# Patient Record
Sex: Male | Born: 1940 | Race: White | Hispanic: No | Marital: Married | Smoking: Current some day smoker
Health system: Southern US, Community
[De-identification: ages and names within clinical notes are randomized; demographics above are authoritative.]

## PROBLEM LIST (undated history)

## (undated) DIAGNOSIS — F329 Major depressive disorder, single episode, unspecified: Secondary | ICD-10-CM

## (undated) DIAGNOSIS — F419 Anxiety disorder, unspecified: Principal | ICD-10-CM

## (undated) DIAGNOSIS — K602 Anal fissure, unspecified: Secondary | ICD-10-CM

## (undated) DIAGNOSIS — F1011 Alcohol abuse, in remission: Secondary | ICD-10-CM

## (undated) DIAGNOSIS — K219 Gastro-esophageal reflux disease without esophagitis: Secondary | ICD-10-CM

## (undated) DIAGNOSIS — K635 Polyp of colon: Secondary | ICD-10-CM

## (undated) DIAGNOSIS — C4490 Unspecified malignant neoplasm of skin, unspecified: Secondary | ICD-10-CM

## (undated) DIAGNOSIS — F410 Panic disorder [episodic paroxysmal anxiety] without agoraphobia: Secondary | ICD-10-CM

## (undated) DIAGNOSIS — I739 Peripheral vascular disease, unspecified: Secondary | ICD-10-CM

## (undated) DIAGNOSIS — N400 Enlarged prostate without lower urinary tract symptoms: Secondary | ICD-10-CM

## (undated) DIAGNOSIS — A630 Anogenital (venereal) warts: Secondary | ICD-10-CM

## (undated) DIAGNOSIS — M1990 Unspecified osteoarthritis, unspecified site: Secondary | ICD-10-CM

## (undated) DIAGNOSIS — I779 Disorder of arteries and arterioles, unspecified: Secondary | ICD-10-CM

## (undated) DIAGNOSIS — F32A Depression, unspecified: Secondary | ICD-10-CM

## (undated) DIAGNOSIS — I10 Essential (primary) hypertension: Secondary | ICD-10-CM

## (undated) HISTORY — DX: Polyp of colon: K63.5

## (undated) HISTORY — PX: KNEE SURGERY: SHX244

## (undated) HISTORY — DX: Depression, unspecified: F32.A

## (undated) HISTORY — DX: Peripheral vascular disease, unspecified: I73.9

## (undated) HISTORY — PX: OTHER SURGICAL HISTORY: SHX169

## (undated) HISTORY — DX: Benign prostatic hyperplasia without lower urinary tract symptoms: N40.0

## (undated) HISTORY — DX: Anogenital (venereal) warts: A63.0

## (undated) HISTORY — PX: APPENDECTOMY: SHX54

## (undated) HISTORY — DX: Major depressive disorder, single episode, unspecified: F32.9

## (undated) HISTORY — PX: TRANSURETHRAL RESECTION OF PROSTATE: SHX73

## (undated) HISTORY — DX: Anal fissure, unspecified: K60.2

## (undated) HISTORY — DX: Essential (primary) hypertension: I10

## (undated) HISTORY — DX: Panic disorder (episodic paroxysmal anxiety): F41.0

## (undated) HISTORY — DX: Gastro-esophageal reflux disease without esophagitis: K21.9

## (undated) HISTORY — DX: Unspecified osteoarthritis, unspecified site: M19.90

## (undated) HISTORY — PX: CATARACT EXTRACTION: SUR2

## (undated) HISTORY — DX: Unspecified malignant neoplasm of skin, unspecified: C44.90

## (undated) HISTORY — DX: Anxiety disorder, unspecified: F41.9

## (undated) HISTORY — DX: Disorder of arteries and arterioles, unspecified: I77.9

## (undated) HISTORY — PX: FOOT SURGERY: SHX648

---

## 1998-01-23 ENCOUNTER — Other Ambulatory Visit: Admission: RE | Admit: 1998-01-23 | Discharge: 1998-01-23 | Payer: Self-pay | Admitting: Urology

## 1998-11-14 ENCOUNTER — Ambulatory Visit (HOSPITAL_COMMUNITY): Admission: RE | Admit: 1998-11-14 | Discharge: 1998-11-14 | Payer: Self-pay | Admitting: *Deleted

## 1998-11-14 ENCOUNTER — Encounter (INDEPENDENT_AMBULATORY_CARE_PROVIDER_SITE_OTHER): Payer: Self-pay | Admitting: *Deleted

## 2000-03-19 ENCOUNTER — Other Ambulatory Visit: Admission: RE | Admit: 2000-03-19 | Discharge: 2000-03-19 | Payer: Self-pay | Admitting: Surgery

## 2000-04-30 ENCOUNTER — Ambulatory Visit (HOSPITAL_BASED_OUTPATIENT_CLINIC_OR_DEPARTMENT_OTHER): Admission: RE | Admit: 2000-04-30 | Discharge: 2000-04-30 | Payer: Self-pay | Admitting: Surgery

## 2001-04-18 ENCOUNTER — Encounter: Admission: RE | Admit: 2001-04-18 | Discharge: 2001-04-18 | Payer: Self-pay | Admitting: Family Medicine

## 2001-04-18 ENCOUNTER — Encounter: Payer: Self-pay | Admitting: Family Medicine

## 2002-09-22 ENCOUNTER — Encounter (INDEPENDENT_AMBULATORY_CARE_PROVIDER_SITE_OTHER): Payer: Self-pay | Admitting: Specialist

## 2002-09-22 ENCOUNTER — Ambulatory Visit (HOSPITAL_COMMUNITY): Admission: RE | Admit: 2002-09-22 | Discharge: 2002-09-22 | Payer: Self-pay | Admitting: *Deleted

## 2003-01-13 HISTORY — PX: TOTAL HIP ARTHROPLASTY: SHX124

## 2003-05-28 ENCOUNTER — Emergency Department (HOSPITAL_COMMUNITY): Admission: EM | Admit: 2003-05-28 | Discharge: 2003-05-28 | Payer: Self-pay | Admitting: Emergency Medicine

## 2003-10-12 ENCOUNTER — Encounter: Admission: RE | Admit: 2003-10-12 | Discharge: 2003-10-12 | Payer: Self-pay | Admitting: Orthopedic Surgery

## 2003-10-15 ENCOUNTER — Encounter: Admission: RE | Admit: 2003-10-15 | Discharge: 2003-10-15 | Payer: Self-pay | Admitting: Orthopedic Surgery

## 2003-10-30 ENCOUNTER — Inpatient Hospital Stay (HOSPITAL_COMMUNITY): Admission: RE | Admit: 2003-10-30 | Discharge: 2003-11-04 | Payer: Self-pay | Admitting: Orthopedic Surgery

## 2005-01-12 LAB — HM COLONOSCOPY

## 2005-08-18 ENCOUNTER — Encounter (INDEPENDENT_AMBULATORY_CARE_PROVIDER_SITE_OTHER): Payer: Self-pay | Admitting: *Deleted

## 2005-08-18 ENCOUNTER — Ambulatory Visit (HOSPITAL_COMMUNITY): Admission: RE | Admit: 2005-08-18 | Discharge: 2005-08-18 | Payer: Self-pay | Admitting: Gastroenterology

## 2005-10-13 ENCOUNTER — Ambulatory Visit: Payer: Self-pay | Admitting: Family Medicine

## 2005-10-30 ENCOUNTER — Ambulatory Visit: Payer: Self-pay | Admitting: *Deleted

## 2005-11-06 ENCOUNTER — Ambulatory Visit: Payer: Self-pay | Admitting: *Deleted

## 2005-11-13 ENCOUNTER — Ambulatory Visit: Payer: Self-pay | Admitting: *Deleted

## 2005-11-17 ENCOUNTER — Ambulatory Visit (HOSPITAL_COMMUNITY): Admission: RE | Admit: 2005-11-17 | Discharge: 2005-11-17 | Payer: Self-pay | Admitting: Gastroenterology

## 2005-11-17 ENCOUNTER — Encounter (INDEPENDENT_AMBULATORY_CARE_PROVIDER_SITE_OTHER): Payer: Self-pay | Admitting: Specialist

## 2005-12-09 ENCOUNTER — Ambulatory Visit: Payer: Self-pay | Admitting: Family Medicine

## 2006-01-08 ENCOUNTER — Ambulatory Visit: Payer: Self-pay | Admitting: Family Medicine

## 2006-01-08 LAB — CONVERTED CEMR LAB
BUN: 11 mg/dL (ref 6–23)
Creatinine, Ser: 1.4 mg/dL (ref 0.4–1.5)
GFR calc non Af Amer: 54 mL/min
Glomerular Filtration Rate, Af Am: 66 mL/min/{1.73_m2}
Potassium: 3.8 meq/L (ref 3.5–5.1)

## 2006-02-15 ENCOUNTER — Ambulatory Visit: Payer: Self-pay | Admitting: Family Medicine

## 2006-03-16 ENCOUNTER — Ambulatory Visit: Payer: Self-pay | Admitting: Family Medicine

## 2006-03-16 LAB — CONVERTED CEMR LAB
Creatinine, Ser: 1 mg/dL (ref 0.4–1.5)
Potassium: 3.9 meq/L (ref 3.5–5.1)

## 2006-04-19 DIAGNOSIS — I10 Essential (primary) hypertension: Secondary | ICD-10-CM | POA: Insufficient documentation

## 2006-04-19 DIAGNOSIS — Z860100 Personal history of colon polyps, unspecified: Secondary | ICD-10-CM | POA: Insufficient documentation

## 2006-04-19 DIAGNOSIS — K219 Gastro-esophageal reflux disease without esophagitis: Secondary | ICD-10-CM

## 2006-07-19 ENCOUNTER — Ambulatory Visit: Payer: Self-pay | Admitting: Family Medicine

## 2006-07-25 LAB — CONVERTED CEMR LAB
BUN: 11 mg/dL (ref 6–23)
Basophils Absolute: 0 10*3/uL (ref 0.0–0.1)
Basophils Relative: 0.5 % (ref 0.0–1.0)
CO2: 29 meq/L (ref 19–32)
Calcium: 9 mg/dL (ref 8.4–10.5)
Creatinine, Ser: 1.2 mg/dL (ref 0.4–1.5)
Monocytes Relative: 9.4 % (ref 3.0–11.0)
Platelets: 178 10*3/uL (ref 150–400)
RBC: 4.44 M/uL (ref 4.22–5.81)
RDW: 13 % (ref 11.5–14.6)

## 2006-07-26 ENCOUNTER — Telehealth (INDEPENDENT_AMBULATORY_CARE_PROVIDER_SITE_OTHER): Payer: Self-pay | Admitting: *Deleted

## 2006-08-31 ENCOUNTER — Telehealth (INDEPENDENT_AMBULATORY_CARE_PROVIDER_SITE_OTHER): Payer: Self-pay | Admitting: *Deleted

## 2006-09-01 ENCOUNTER — Ambulatory Visit: Payer: Self-pay | Admitting: Family Medicine

## 2006-09-08 ENCOUNTER — Telehealth (INDEPENDENT_AMBULATORY_CARE_PROVIDER_SITE_OTHER): Payer: Self-pay | Admitting: *Deleted

## 2006-09-20 ENCOUNTER — Ambulatory Visit: Payer: Self-pay | Admitting: Family Medicine

## 2006-09-22 LAB — CONVERTED CEMR LAB
BUN: 9 mg/dL (ref 6–23)
CO2: 27 meq/L (ref 19–32)
Calcium: 9.4 mg/dL (ref 8.4–10.5)
Chloride: 106 meq/L (ref 96–112)
GFR calc non Af Amer: 72 mL/min
Glucose, Bld: 84 mg/dL (ref 70–99)

## 2006-09-23 ENCOUNTER — Telehealth (INDEPENDENT_AMBULATORY_CARE_PROVIDER_SITE_OTHER): Payer: Self-pay | Admitting: *Deleted

## 2006-09-27 ENCOUNTER — Ambulatory Visit: Payer: Self-pay | Admitting: Family Medicine

## 2006-10-04 ENCOUNTER — Ambulatory Visit: Payer: Self-pay | Admitting: Cardiology

## 2006-10-20 ENCOUNTER — Ambulatory Visit: Payer: Self-pay | Admitting: Family Medicine

## 2006-10-21 ENCOUNTER — Encounter (INDEPENDENT_AMBULATORY_CARE_PROVIDER_SITE_OTHER): Payer: Self-pay | Admitting: *Deleted

## 2006-10-21 LAB — CONVERTED CEMR LAB
BUN: 8 mg/dL (ref 6–23)
CO2: 29 meq/L (ref 19–32)
Calcium: 9.4 mg/dL (ref 8.4–10.5)
Chloride: 109 meq/L (ref 96–112)
GFR calc non Af Amer: 72 mL/min
Glucose, Bld: 101 mg/dL — ABNORMAL HIGH (ref 70–99)

## 2006-12-02 ENCOUNTER — Ambulatory Visit: Payer: Self-pay | Admitting: Family Medicine

## 2007-02-14 ENCOUNTER — Telehealth (INDEPENDENT_AMBULATORY_CARE_PROVIDER_SITE_OTHER): Payer: Self-pay | Admitting: *Deleted

## 2007-02-16 ENCOUNTER — Ambulatory Visit: Payer: Self-pay | Admitting: Family Medicine

## 2007-02-18 ENCOUNTER — Telehealth (INDEPENDENT_AMBULATORY_CARE_PROVIDER_SITE_OTHER): Payer: Self-pay | Admitting: *Deleted

## 2007-02-18 LAB — CONVERTED CEMR LAB
AST: 20 units/L (ref 0–37)
BUN: 8 mg/dL (ref 6–23)
CO2: 27 meq/L (ref 19–32)
Creatinine, Ser: 1.1 mg/dL (ref 0.4–1.5)
GFR calc Af Amer: 87 mL/min
HDL: 34.8 mg/dL — ABNORMAL LOW (ref >39.0)
Potassium: 3.4 meq/L — ABNORMAL LOW (ref 3.5–5.1)
Sodium: 143 meq/L (ref 135–145)
Total CHOL/HDL Ratio: 4.6
Triglycerides: 128 mg/dL (ref 0–149)

## 2007-02-21 DIAGNOSIS — F410 Panic disorder [episodic paroxysmal anxiety] without agoraphobia: Secondary | ICD-10-CM | POA: Insufficient documentation

## 2007-02-21 DIAGNOSIS — Z87898 Personal history of other specified conditions: Secondary | ICD-10-CM

## 2007-02-21 DIAGNOSIS — K2270 Barrett's esophagus without dysplasia: Secondary | ICD-10-CM

## 2007-03-02 ENCOUNTER — Telehealth (INDEPENDENT_AMBULATORY_CARE_PROVIDER_SITE_OTHER): Payer: Self-pay | Admitting: *Deleted

## 2007-03-02 ENCOUNTER — Ambulatory Visit: Payer: Self-pay | Admitting: Family Medicine

## 2007-03-04 ENCOUNTER — Encounter (INDEPENDENT_AMBULATORY_CARE_PROVIDER_SITE_OTHER): Payer: Self-pay | Admitting: *Deleted

## 2007-03-04 ENCOUNTER — Ambulatory Visit: Payer: Self-pay | Admitting: Family Medicine

## 2007-03-04 LAB — CONVERTED CEMR LAB: Potassium: 4.2 meq/L (ref 3.5–5.1)

## 2007-05-09 ENCOUNTER — Telehealth: Payer: Self-pay | Admitting: Internal Medicine

## 2007-05-18 ENCOUNTER — Ambulatory Visit: Payer: Self-pay | Admitting: Internal Medicine

## 2007-05-18 DIAGNOSIS — F17200 Nicotine dependence, unspecified, uncomplicated: Secondary | ICD-10-CM

## 2007-05-18 DIAGNOSIS — F528 Other sexual dysfunction not due to a substance or known physiological condition: Secondary | ICD-10-CM | POA: Insufficient documentation

## 2007-06-21 ENCOUNTER — Encounter: Payer: Self-pay | Admitting: Internal Medicine

## 2007-07-13 ENCOUNTER — Encounter: Payer: Self-pay | Admitting: Internal Medicine

## 2007-07-28 ENCOUNTER — Encounter: Payer: Self-pay | Admitting: Internal Medicine

## 2007-08-04 ENCOUNTER — Encounter: Payer: Self-pay | Admitting: Internal Medicine

## 2007-09-21 ENCOUNTER — Ambulatory Visit: Payer: Self-pay | Admitting: Internal Medicine

## 2007-09-26 ENCOUNTER — Telehealth (INDEPENDENT_AMBULATORY_CARE_PROVIDER_SITE_OTHER): Payer: Self-pay | Admitting: *Deleted

## 2007-09-26 LAB — CONVERTED CEMR LAB
BUN: 10 mg/dL (ref 6–23)
Basophils Relative: 0.3 % (ref 0.0–3.0)
Creatinine, Ser: 1.3 mg/dL (ref 0.4–1.5)
Eosinophils Absolute: 0.1 10*3/uL (ref 0.0–0.7)
Eosinophils Relative: 1.8 % (ref 0.0–5.0)
GFR calc Af Amer: 71 mL/min
GFR calc non Af Amer: 59 mL/min
Glucose, Bld: 95 mg/dL (ref 70–99)
HCT: 47.1 % (ref 39.0–52.0)
Hemoglobin: 16.2 g/dL (ref 13.0–17.0)
MCV: 102.5 fL — ABNORMAL HIGH (ref 78.0–100.0)
Monocytes Absolute: 0.7 10*3/uL (ref 0.1–1.0)
Monocytes Relative: 8.3 % (ref 3.0–12.0)
Neutro Abs: 5.4 10*3/uL (ref 1.4–7.7)
Platelets: 170 10*3/uL (ref 150–400)
Potassium: 4.8 meq/L (ref 3.5–5.1)
RBC: 4.6 M/uL (ref 4.22–5.81)
WBC: 8 10*3/uL (ref 4.5–10.5)

## 2007-10-05 ENCOUNTER — Ambulatory Visit: Payer: Self-pay | Admitting: Internal Medicine

## 2007-10-11 ENCOUNTER — Telehealth (INDEPENDENT_AMBULATORY_CARE_PROVIDER_SITE_OTHER): Payer: Self-pay | Admitting: *Deleted

## 2008-01-19 ENCOUNTER — Telehealth (INDEPENDENT_AMBULATORY_CARE_PROVIDER_SITE_OTHER): Payer: Self-pay | Admitting: *Deleted

## 2008-02-21 ENCOUNTER — Ambulatory Visit: Payer: Self-pay | Admitting: Internal Medicine

## 2008-02-21 DIAGNOSIS — M1990 Unspecified osteoarthritis, unspecified site: Secondary | ICD-10-CM

## 2008-04-10 ENCOUNTER — Telehealth (INDEPENDENT_AMBULATORY_CARE_PROVIDER_SITE_OTHER): Payer: Self-pay | Admitting: *Deleted

## 2008-04-11 ENCOUNTER — Telehealth (INDEPENDENT_AMBULATORY_CARE_PROVIDER_SITE_OTHER): Payer: Self-pay | Admitting: *Deleted

## 2008-04-12 ENCOUNTER — Telehealth (INDEPENDENT_AMBULATORY_CARE_PROVIDER_SITE_OTHER): Payer: Self-pay | Admitting: *Deleted

## 2008-05-14 ENCOUNTER — Telehealth: Payer: Self-pay | Admitting: Internal Medicine

## 2008-05-31 ENCOUNTER — Encounter: Payer: Self-pay | Admitting: Internal Medicine

## 2008-07-02 ENCOUNTER — Telehealth (INDEPENDENT_AMBULATORY_CARE_PROVIDER_SITE_OTHER): Payer: Self-pay | Admitting: *Deleted

## 2008-08-24 ENCOUNTER — Telehealth (INDEPENDENT_AMBULATORY_CARE_PROVIDER_SITE_OTHER): Payer: Self-pay | Admitting: *Deleted

## 2008-09-06 ENCOUNTER — Encounter: Payer: Self-pay | Admitting: Internal Medicine

## 2008-09-07 ENCOUNTER — Ambulatory Visit: Payer: Self-pay | Admitting: Internal Medicine

## 2008-11-08 ENCOUNTER — Ambulatory Visit: Payer: Self-pay | Admitting: Cardiology

## 2008-11-12 HISTORY — PX: OTHER SURGICAL HISTORY: SHX169

## 2008-11-15 ENCOUNTER — Inpatient Hospital Stay (HOSPITAL_COMMUNITY): Admission: EM | Admit: 2008-11-15 | Discharge: 2008-11-23 | Payer: Self-pay | Admitting: Emergency Medicine

## 2008-11-19 ENCOUNTER — Ambulatory Visit: Payer: Self-pay | Admitting: Vascular Surgery

## 2008-11-19 ENCOUNTER — Encounter (INDEPENDENT_AMBULATORY_CARE_PROVIDER_SITE_OTHER): Payer: Self-pay | Admitting: Internal Medicine

## 2008-11-21 ENCOUNTER — Encounter: Payer: Self-pay | Admitting: Cardiology

## 2008-11-27 ENCOUNTER — Encounter: Payer: Self-pay | Admitting: Vascular Surgery

## 2008-11-27 ENCOUNTER — Encounter: Payer: Self-pay | Admitting: Cardiology

## 2008-11-27 ENCOUNTER — Inpatient Hospital Stay (HOSPITAL_COMMUNITY): Admission: RE | Admit: 2008-11-27 | Discharge: 2008-11-29 | Payer: Self-pay | Admitting: Vascular Surgery

## 2008-12-03 ENCOUNTER — Ambulatory Visit: Payer: Self-pay | Admitting: Internal Medicine

## 2008-12-03 DIAGNOSIS — I6529 Occlusion and stenosis of unspecified carotid artery: Secondary | ICD-10-CM | POA: Insufficient documentation

## 2008-12-03 DIAGNOSIS — F1010 Alcohol abuse, uncomplicated: Secondary | ICD-10-CM | POA: Insufficient documentation

## 2008-12-20 ENCOUNTER — Encounter: Payer: Self-pay | Admitting: Cardiology

## 2008-12-20 ENCOUNTER — Ambulatory Visit: Payer: Self-pay | Admitting: Vascular Surgery

## 2009-02-14 ENCOUNTER — Ambulatory Visit: Payer: Self-pay | Admitting: Internal Medicine

## 2009-02-14 DIAGNOSIS — F419 Anxiety disorder, unspecified: Secondary | ICD-10-CM | POA: Insufficient documentation

## 2009-02-14 DIAGNOSIS — R5381 Other malaise: Secondary | ICD-10-CM

## 2009-02-14 DIAGNOSIS — R5383 Other fatigue: Secondary | ICD-10-CM

## 2009-02-14 DIAGNOSIS — F32A Depression, unspecified: Secondary | ICD-10-CM

## 2009-02-14 DIAGNOSIS — F329 Major depressive disorder, single episode, unspecified: Secondary | ICD-10-CM | POA: Insufficient documentation

## 2009-02-14 HISTORY — DX: Anxiety disorder, unspecified: F41.9

## 2009-02-14 HISTORY — DX: Depression, unspecified: F32.A

## 2009-02-18 LAB — CONVERTED CEMR LAB
Albumin: 4 g/dL (ref 3.5–5.2)
Basophils Absolute: 0.1 10*3/uL (ref 0.0–0.1)
CO2: 26 meq/L (ref 19–32)
Calcium: 9.6 mg/dL (ref 8.4–10.5)
Creatinine, Ser: 1.3 mg/dL (ref 0.4–1.5)
Eosinophils Absolute: 0.2 10*3/uL (ref 0.0–0.7)
GFR calc non Af Amer: 58.58 mL/min (ref >60)
Glucose, Bld: 82 mg/dL (ref 70–99)
HCT: 43.9 % (ref 39.0–52.0)
Lymphs Abs: 2.3 10*3/uL (ref 0.7–4.0)
MCV: 102.9 fL — ABNORMAL HIGH (ref 78.0–100.0)
Monocytes Absolute: 0.9 10*3/uL (ref 0.1–1.0)
Neutrophils Relative %: 63.7 % (ref 43.0–77.0)
Platelets: 197 10*3/uL (ref 150.0–400.0)
RDW: 13 % (ref 11.5–14.6)
TSH: 1.6 microintl units/mL (ref 0.35–5.50)

## 2009-03-01 ENCOUNTER — Ambulatory Visit: Payer: Self-pay | Admitting: Internal Medicine

## 2009-03-01 ENCOUNTER — Emergency Department (HOSPITAL_COMMUNITY): Admission: EM | Admit: 2009-03-01 | Discharge: 2009-03-01 | Payer: Self-pay | Admitting: Emergency Medicine

## 2009-03-04 ENCOUNTER — Telehealth (INDEPENDENT_AMBULATORY_CARE_PROVIDER_SITE_OTHER): Payer: Self-pay | Admitting: *Deleted

## 2009-03-05 ENCOUNTER — Ambulatory Visit: Payer: Self-pay | Admitting: Cardiology

## 2009-03-06 ENCOUNTER — Ambulatory Visit: Payer: Self-pay | Admitting: Internal Medicine

## 2009-03-13 ENCOUNTER — Telehealth (INDEPENDENT_AMBULATORY_CARE_PROVIDER_SITE_OTHER): Payer: Self-pay | Admitting: *Deleted

## 2009-03-14 ENCOUNTER — Encounter (HOSPITAL_COMMUNITY): Admission: RE | Admit: 2009-03-14 | Discharge: 2009-05-14 | Payer: Self-pay | Admitting: Cardiology

## 2009-03-14 ENCOUNTER — Ambulatory Visit: Payer: Self-pay | Admitting: Internal Medicine

## 2009-03-14 ENCOUNTER — Ambulatory Visit: Payer: Self-pay

## 2009-03-20 ENCOUNTER — Telehealth (INDEPENDENT_AMBULATORY_CARE_PROVIDER_SITE_OTHER): Payer: Self-pay | Admitting: *Deleted

## 2009-04-01 ENCOUNTER — Ambulatory Visit: Payer: Self-pay | Admitting: Internal Medicine

## 2009-04-11 ENCOUNTER — Telehealth (INDEPENDENT_AMBULATORY_CARE_PROVIDER_SITE_OTHER): Payer: Self-pay | Admitting: *Deleted

## 2009-04-15 ENCOUNTER — Ambulatory Visit: Payer: Self-pay | Admitting: Internal Medicine

## 2009-04-19 ENCOUNTER — Ambulatory Visit: Payer: Self-pay | Admitting: Internal Medicine

## 2009-04-26 ENCOUNTER — Ambulatory Visit: Payer: Self-pay | Admitting: Internal Medicine

## 2009-05-06 ENCOUNTER — Ambulatory Visit: Payer: Self-pay | Admitting: Internal Medicine

## 2009-05-21 ENCOUNTER — Telehealth: Payer: Self-pay | Admitting: Internal Medicine

## 2009-07-09 ENCOUNTER — Ambulatory Visit: Payer: Self-pay | Admitting: Vascular Surgery

## 2009-07-25 ENCOUNTER — Ambulatory Visit: Payer: Self-pay | Admitting: Internal Medicine

## 2009-07-29 LAB — CONVERTED CEMR LAB
Cholesterol: 179 mg/dL (ref 0–200)
HDL: 37.2 mg/dL — ABNORMAL LOW (ref >39.00)
Triglycerides: 122 mg/dL (ref 0.0–149.0)
VLDL: 24.4 mg/dL (ref 0.0–40.0)

## 2009-09-02 ENCOUNTER — Ambulatory Visit: Payer: Self-pay | Admitting: Internal Medicine

## 2009-09-03 ENCOUNTER — Encounter: Payer: Self-pay | Admitting: Cardiology

## 2009-09-04 ENCOUNTER — Ambulatory Visit: Payer: Self-pay | Admitting: Cardiology

## 2009-09-06 LAB — CONVERTED CEMR LAB
ALT: 15 units/L (ref 0–53)
LDL Cholesterol: 72 mg/dL (ref 0–99)
Total CHOL/HDL Ratio: 3
Triglycerides: 115 mg/dL (ref 0.0–149.0)

## 2009-11-25 ENCOUNTER — Ambulatory Visit: Payer: Self-pay | Admitting: Internal Medicine

## 2009-11-25 DIAGNOSIS — R269 Unspecified abnormalities of gait and mobility: Secondary | ICD-10-CM

## 2009-11-26 ENCOUNTER — Telehealth: Payer: Self-pay | Admitting: Internal Medicine

## 2009-11-27 LAB — CONVERTED CEMR LAB
Basophils Absolute: 0 10*3/uL (ref 0.0–0.1)
Calcium: 8.8 mg/dL (ref 8.4–10.5)
GFR calc non Af Amer: 73.17 mL/min (ref >60)
Glucose, Bld: 80 mg/dL (ref 70–99)
Hemoglobin: 14.9 g/dL (ref 13.0–17.0)
Lymphocytes Relative: 20.2 % (ref 12.0–46.0)
Monocytes Relative: 6.4 % (ref 3.0–12.0)
Neutro Abs: 6.1 10*3/uL (ref 1.4–7.7)
Platelets: 143 10*3/uL — ABNORMAL LOW (ref 150.0–400.0)
RDW: 13.9 % (ref 11.5–14.6)
Sodium: 139 meq/L (ref 135–145)

## 2009-12-03 ENCOUNTER — Encounter: Payer: Self-pay | Admitting: Internal Medicine

## 2009-12-16 ENCOUNTER — Encounter
Admission: RE | Admit: 2009-12-16 | Discharge: 2010-01-09 | Payer: Self-pay | Source: Home / Self Care | Attending: Internal Medicine | Admitting: Internal Medicine

## 2010-01-09 ENCOUNTER — Ambulatory Visit: Payer: Self-pay | Admitting: Vascular Surgery

## 2010-01-15 ENCOUNTER — Telehealth: Payer: Self-pay | Admitting: Internal Medicine

## 2010-02-09 LAB — CONVERTED CEMR LAB
AST: 27 units/L (ref 0–37)
BUN: 9 mg/dL (ref 6–23)
Chloride: 109 meq/L (ref 96–112)
Creatinine, Ser: 1.1 mg/dL (ref 0.4–1.5)
Folate: 12.9 ng/mL
GFR calc Af Amer: 86 mL/min
GFR calc non Af Amer: 71 mL/min
HDL: 44.2 mg/dL (ref >39.0)
PSA: 0.73 ng/mL
Potassium: 4.2 meq/L (ref 3.5–5.1)
TSH: 1.17 microintl units/mL (ref 0.35–5.50)
Total CHOL/HDL Ratio: 3
Vitamin B-12: 334 pg/mL (ref 211–911)

## 2010-02-10 ENCOUNTER — Telehealth (INDEPENDENT_AMBULATORY_CARE_PROVIDER_SITE_OTHER): Payer: Self-pay | Admitting: *Deleted

## 2010-02-13 NOTE — Progress Notes (Signed)
Summary: Nuclear Pre-Procedure  Phone Note Outgoing Call   Call placed by: Milana Na, EMT-P,  March 13, 2009 2:57 PM Summary of Call: Left message with information on [employer] Information Sheet (see scanned document for details).      Nuclear Med Background Indications for Stress Test: Evaluation for Ischemia, Post Hospital  Indications Comments: 03/01/09 [facility] ER Hypotension, Vertigo  History: Myocardial Perfusion Study  History Comments: '96 MPS No report in the chart     Nuclear Pre-Procedure Cardiac Risk Factors: Carotid Disease, Hypertension, Smoker Height (in): 73  Nuclear Med Study Referring MD:  J.Hochrein

## 2010-02-13 NOTE — Miscellaneous (Signed)
  Clinical Lists Changes  Observations: Added new observation of NUCLEAR NOS: Exercise Capacity: Lexiscan  BP Response: Normal blood pressure response. Clinical Symptoms: Mild chest pain/dyspnea. ECG Impression: Less than 1 mm ST depression in V6 Overall Impression: Normal perfusion.   NO ischemia or scar.  LVEF 61%.   (03/14/2009 9:13)      Nuclear Study  Procedure date:  03/14/2009  Findings:      Exercise Capacity: Lexiscan  BP Response: Normal blood pressure response. Clinical Symptoms: Mild chest pain/dyspnea. ECG Impression: Less than 1 mm ST depression in V6 Overall Impression: Normal perfusion.   NO ischemia or scar.  LVEF 61%.

## 2010-02-13 NOTE — Progress Notes (Signed)
  Phone Note Call from Patient Call back at Home Phone [phone]   Caller: Patient Summary of Call: Pt called to followup from ED, offered appt for tomorrow, pt says cannot come ov sched for Wed .Kandice Hams  March 04, 2009 11:43 AM  Initial call taken by: Kandice Hams,  March 04, 2009 11:43 AM

## 2010-02-13 NOTE — Assessment & Plan Note (Signed)
Summary: 2 week fu/ns/kdc   Vital Signs:  Patient profile:   70 year old male Height:      73 inches Weight:      210 pounds BMI:     27.81 O2 Sat:      95 % on Room air Temp:     98.5 degrees F oral Pulse rate:   90 / minute Pulse rhythm:   regular BP sitting:   60 / 40  (left arm) Cuff size:   large  Vitals Entered By: Shary Decamp (March 01, 2009 11:35 AM)  O2 Flow:  Room air  Serial Vital Signs/Assessments:  Time      Position  BP       Pulse  Resp  Temp     By 11:36 AM  R Arm     62/42                          Shary Decamp  CC: rov - feeling worse Comments  - feeling worse since starting on prozac 2 weeks ago  - fatigue, weak, decreased app, sleeping all day  - low back pain, legs hurt Shary Decamp  March 01, 2009 11:36 AM    History of Present Illness: seen here acutely the patient called and stated that he is feeling weaker since the last office visit 02-14-09 at the time of the last visit we changed his medication in the following way:  Switch buspar to  Prozac discontinue Benicar---> star  lisinopril and HCTZ   Current Medications (verified): 1)  Amlodipine Besylate 10 Mg Tabs (Amlodipine Besylate) .Marland Kitchen.. 1 By Mouth Once Daily 2)  Omeprazole 40 Mg Cpdr (Omeprazole) .Marland Kitchen.. 1 By Mouth Once Daily 3)  Lorazepam 0.5 Mg  Tabs (Lorazepam) .... Take One Tablet Twice Daily As Needed. 4)  Bayer Aspirin 325 Mg Tabs (Aspirin) .Marland Kitchen.. 1 By Mouth Once Daily 5)  Viagra 100 Mg  Tabs (Sildenafil Citrate) .... Take Half or One A Day As Needed 6)  Centrum Performance   Tabs (Specialty Vitamins Products) .... Daily 7)  Folic Acid 1 Mg Tabs (Folic Acid) .Marland Kitchen.. 1 By Mouth Once Daily 8)  Vitamin E .... Daily 9)  Thiamine Hcl 100 Mg Tabs (Thiamine Hcl) .Marland Kitchen.. 1 By Mouth Once Daily 10)  Mvi .Marland Kitchen.. 1tab Every Day 11)  Prozac 10 Mg Caps (Fluoxetine Hcl) .... One A Day For 10 Days, Then Two A Day 12)  Lisinopril 40 Mg Tabs (Lisinopril) .Marland Kitchen.. 1 By Mouth Daily 13)  Hydrochlorothiazide 25 Mg  Tabs (Hydrochlorothiazide) .... One By Mouth Every Morning  Allergies (verified): 1)  ! Zoloft 2)  ! * Nicotine Patches 3)  ! * Zyban  Past History:  Past Medical History: Reviewed history from 02/14/2009 and no changes required. Carotid Artery Dz, s/p surgery 11-2008 Hypertension ------ COLONIC POLYPS, Cscope 08-18-05: tics, polyps... all hyperplastic GERD, Barretts Esoph. (per Bx 08-2005)  and HH--f/u Dr Bosie Clos Anal warts, ANAL FISSURE, HX OF  , HEMORRHOIDS ------ SKIN Ca :  SQUAMOUS CELL , L side of face; s/p removal aprox 2009 BPH----- f/u w/ Dr Etta Grandchild, s/p procedure 01-30-2008 Hx of PANIC ATTACK  OA Depression  Past Surgical History: Reviewed history from 12/03/2008 and no changes required. Right carotid endarterectomy with Dacron patch angioplasty 11-2008 on November 27, 2008. Appendectomy Knee surgery Foot surgery (fx.) Hip Replacement --R-- due to OA  (2005) Skin tags excised   TURP  Social History: Reviewed history from 02/14/2009 and  no changes required. 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  Since the last office visit apparently did not turn has increasing frequency.   Physical Exam  General:  alert and well-developed.   Lungs:  normal respiratory effort, no intercostal retractions, no accessory muscle use, and decreased breath sounds Heart:  normal rate, regular rhythm, no murmur, and no gallop.   Extremities:  no edema Neurologic:  alert, oriented x 3, coherent & cooperative   Impression & Recommendations:  Problem # 1:  A/P: the patient presents today with the following problems --hypotension in the setting of  a recent change in his BP medications --chronic chest pain, increase frequency? -- cough with sputum production  --arthralgias -- ongoing tobacco and alcohol abuese I discussed the situation with the patient, his low BP is likely from the BP meds. I told the patient he needs to be admitted.  Patient refused, he takes care of elderly mother at home He then agreed to a ER visit I spoke  with the ER doctor, Dr. Vonzell Schlatter, I think that if the workup is negative ( labs, chest x-ray, CKs) and his BP  gets better with IV fluids , he could go home with an adjusted BP prescription.  I will follow-up on him Monday.  He understand that he  still need to be admitted. **** Will enclose the previous OV note for completness  patient refused to go by ambulance.  I was unable to get him  in the examining table to get a EKG  **** correction:  he still MAY need to be admitted  Problem # 2:  HYPERTENSION (ICD-401.9) see #1 His updated medication list for this problem includes:    Amlodipine Besylate 10 Mg Tabs (Amlodipine besylate) .Marland Kitchen... 1 by mouth once daily    Lisinopril 40 Mg Tabs (Lisinopril) .Marland Kitchen... 1 by mouth daily    Hydrochlorothiazide 25 Mg Tabs (Hydrochlorothiazide) ..... One by mouth every morning  Problem # 3:  CHEST PAIN (ICD-786.50) see #1  Problem # 4:  >> 25 min spent coordinating the pt's care   Complete Medication List: 1)  Amlodipine Besylate 10 Mg Tabs (Amlodipine besylate) .Marland Kitchen.. 1 by mouth once daily 2)  Omeprazole 40 Mg Cpdr (Omeprazole) .Marland Kitchen.. 1 by mouth once daily 3)  Lorazepam 0.5 Mg Tabs (Lorazepam) .... Take one tablet twice daily as needed. 4)  Bayer Aspirin 325 Mg Tabs (Aspirin) .Marland Kitchen.. 1 by mouth once daily 5)  Viagra 100 Mg Tabs (Sildenafil citrate) .... Take half or one a day as needed 6)  Centrum Performance Tabs (Specialty vitamins products) .... Daily 7)  Folic Acid 1 Mg Tabs (Folic acid) .Marland Kitchen.. 1 by mouth once daily 8)  Vitamin E  .... Daily 9)  Thiamine Hcl 100 Mg Tabs  (Thiamine hcl) .Marland Kitchen.. 1 by mouth once daily 10)  Mvi  .Marland Kitchen.. 1tab every day 11)  Prozac 10 Mg Caps (Fluoxetine hcl) .... One a day for 10 days, then two a day 12)  Lisinopril 40 Mg Tabs (Lisinopril) .Marland Kitchen.. 1 by mouth daily 13)  Hydrochlorothiazide 25 Mg Tabs (Hydrochlorothiazide) .... One by  mouth every morning

## 2010-02-13 NOTE — Assessment & Plan Note (Signed)
Summary: 3 month roa//lch//rsh from bmp//lch   Vital Signs:  Patient profile:   70 year old male Weight:      188.38 pounds Pulse rate:   84 / minute Pulse rhythm:   regular BP sitting:   128 / 70  (left arm) Cuff size:   large  Vitals Entered By: Army Fossa CMA (July 25, 2009 10:06 AM) CC: Pt here for follow up: Fasting   History of Present Illness: s/p cataract renmoval, was unable to drive x 2 weeks, back driving  Hypertension-- good ambulatory BPs  GERD-- asx , good medication compliance     Allergies: 1)  ! Zoloft 2)  ! * Nicotine Patches 3)  ! * Zyban  Past History:  Past Medical History: Reviewed history from 02/14/2009 and no changes required. Carotid Artery Dz, s/p surgery 11-2008 Hypertension ------ COLONIC POLYPS, Cscope 08-18-05: tics, polyps... 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 Right carotid endarterectomy with Dacron patch angioplasty 11-2008 Appendectomy Knee surgery Foot surgery (fx.) Hip Replacement --R-- due to OA  (2005) Skin tags excised   TURP  Social History: Retired divorce 2 kids, [place], [place] mom lives w/ him,88 y/o bed ridden he is a single child  Current Smoker-- > 1 ppd  + ETOH abuse -- "have a drink now and then" Drug use-no Regular exercise-no  Review of Systems CV:  Denies chest pain or discomfort and swelling of feet. Resp:  Denies cough, coughing up blood, and shortness of breath. Psych:  he feels sleepy all day long despite the fact that he sleeps 8 hours at night . He thinks is due to his lifestyle, he takes care of his mother. His usual routine is to feed his mom, watch TV,  feet mom again and then go to sleep. He watches TV all day  long, smokes a pack a day. admits to occasional depressive mood Does not know if he snores.  Physical Exam  General:  alert and well-developed.  appears older than his age Lungs:  normal respiratory effort, no intercostal retractions, no accessory muscle use, and normal breath sounds.   Heart:  normal rate, regular rhythm, no murmur, and no gallop.   Psych:  Oriented X3, normally interactive, and good eye contact.  seems to be tired but not particulalrly anxious or depressed   Impression & Recommendations:  Problem # 1:  DEPRESSION (ICD-311) see ROS We discussed SSRIs, declined to try at this time His updated medication list for this problem includes:    Lorazepam 0.5 Mg Tabs (Lorazepam) .Marland Kitchen... 1 by mouth two times a day  Problem # 2:  HYPERTENSION (ICD-401.9)  at goal  His updated medication list for this problem includes:    Metoprolol Tartrate 25 Mg Tabs (Metoprolol tartrate) .Marland Kitchen... Take 1.5 tab  two times a day    Amlodipine Besylate 10 Mg Tabs (Amlodipine besylate) .Marland Kitchen... 1/2 by mouth once daily    BP today: 128/70 Prior BP: 124/72 (05/06/2009)  Labs Reviewed: K+: 4.7 (02/14/2009) Creat: : 1.3 (02/14/2009)   Chol: 131 (02/21/2008)   HDL: 44.2 (02/21/2008)   LDL: 74 (02/21/2008)   TG: 65 (02/21/2008)  Orders: Venipuncture (40981)  TLB-Lipid Panel (80061-LIPID) Specimen Handling (82956)  Problem # 3:  declined a CPX, see instructions  Problem # 4:  TOBACCO ABUSE (ICD-305.1) I tried to counseling about tobacco abuse "  I am not ready"  Problem # 5:  ALCOHOL ABUSE (ICD-305.00) still drinks   Complete Medication List: 1)  Omeprazole 40 Mg Cpdr (Omeprazole) .Marland Kitchen.. 1 by mouth once daily 2)  Lorazepam 0.5 Mg Tabs (Lorazepam) .Marland Kitchen.. 1 by mouth two times a day 3)  Bayer Aspirin 325 Mg Tabs (Aspirin) .Marland Kitchen.. 1 by mouth once daily 4)  Metoprolol Tartrate 25 Mg Tabs (Metoprolol tartrate) .... Take 1.5 tab  two times a  day 5)  Amlodipine Besylate 10 Mg Tabs (Amlodipine besylate) .... 1/2 by mouth once daily  Patient Instructions: 1)  please schedule an office visit in 4 months, complete physical exam

## 2010-02-13 NOTE — Assessment & Plan Note (Signed)
Summary: 4 month rov/sl  Medications Added AMLODIPINE BESYLATE 10 MG TABS (AMLODIPINE BESYLATE) HOLD OMEPRAZOLE 40 MG CPDR (OMEPRAZOLE) HOLD LORAZEPAM 0.5 MG  TABS (LORAZEPAM) HOLD BAYER ASPIRIN 325 MG TABS (ASPIRIN) HODL VIAGRA 100 MG  TABS (SILDENAFIL CITRATE) HOLD FOLIC ACID 1 MG TABS (FOLIC ACID) HOLD THIAMINE HCL 100 MG TABS (THIAMINE HCL) HOLD * MVI HOLD PROZAC 10 MG CAPS (FLUOXETINE HCL) HOLD LISINOPRIL 40 MG TABS (LISINOPRIL) HOLD HYDROCHLOROTHIAZIDE 25 MG TABS (HYDROCHLOROTHIAZIDE) HOLD MECLIZINE HCL 25 MG TABS (MECLIZINE HCL) 1 by mouth as needed CEFUROXIME AXETIL 500 MG TABS (CEFUROXIME AXETIL) 1 by mouth two times a day      Allergies Added:   Visit Type:  Follow-up Primary Provider:  Nolon Rod. Paz MD  CC:  Chest Pain and Hypotension.  History of Present Illness: The patient presents after a recent ER visit. He saw Dr. Drue Novel one morning and was complaining of weakness and lightheadedness. He was found to have a blood pressure of 60/40 and was referred to the emergency room. I reviewed these records. His laboratory exam was normal except for white blood cells in his urine. He was hydrated and treated with antibiotics for a urinary infection and Antivert for vertigo. He was told not to take his blood pressure medicines. Of note earlier in February the patient had been taken off of Benicar and switched to lisinopril HCT. His blood pressure had been running in the normal range on this regimen. He reports no acute events though he continues to have some chest discomfort which is sporadic and substernal. He has had no increase in this. He's not had any new shortness of breath, PND or orthopnea. He says he is actually not drinking alcohol though he is still smoking about a pack of cigarettes per day. He has not had any palpitations, presyncope or syncope.  Current Medications (verified): 1)  Amlodipine Besylate 10 Mg Tabs (Amlodipine Besylate) .... Hold 2)  Omeprazole 40 Mg Cpdr  (Omeprazole) .... Hold 3)  Lorazepam 0.5 Mg  Tabs (Lorazepam) .... Hold 4)  Bayer Aspirin 325 Mg Tabs (Aspirin) .... Hodl 5)  Viagra 100 Mg  Tabs (Sildenafil Citrate) .... Hold 6)  Centrum Performance   Tabs (Specialty Vitamins Products) .... Daily 7)  Folic Acid 1 Mg Tabs (Folic Acid) .... Hold 8)  Vitamin E .... Daily 9)  Thiamine Hcl 100 Mg Tabs (Thiamine Hcl) .... Hold 10)  Mvi .... Hold 11)  Prozac 10 Mg Caps (Fluoxetine Hcl) .... Hold 12)  Lisinopril 40 Mg Tabs (Lisinopril) .... Hold 13)  Hydrochlorothiazide 25 Mg Tabs (Hydrochlorothiazide) .... Hold 14)  Meclizine Hcl 25 Mg Tabs (Meclizine Hcl) .Marland Kitchen.. 1 By Mouth As Needed 15)  Cefuroxime Axetil 500 Mg Tabs (Cefuroxime Axetil) .Marland Kitchen.. 1 By Mouth Two Times A Day  Allergies (verified): 1)  ! Zoloft 2)  ! * Nicotine Patches 3)  ! * Zyban  Past History:  Past Medical History: Reviewed history from 02/14/2009 and no changes required. Carotid Artery Dz, s/p surgery 11-2008 Hypertension ------ COLONIC POLYPS, Cscope 08-18-05: tics, polyps... all hyperplastic GERD, Barretts Esoph. (per Bx 08-2005)  and HH--f/u Dr Bosie Clos Anal warts, ANAL FISSURE, HX OF  , HEMORRHOIDS ------ SKIN Ca :  SQUAMOUS CELL , L side of face; s/p removal aprox 2009 BPH----- f/u w/ Dr Etta Grandchild, s/p procedure 01-30-2008 Hx of PANIC ATTACK  OA Depression  Past Surgical History: Right carotid endarterectomy with Dacron patch angioplasty 11-2008 Appendectomy Knee surgery Foot surgery (fx.) Hip Replacement --R-- due to OA  (  2005) Skin tags excised   TURP  Review of Systems       As stated in the HPI and negative for all other systems.   Vital Signs:  Patient profile:   70 year old male Height:      73 inches Weight:      198 pounds BMI:     26.22 Pulse rate:   83 / minute Resp:     16 per minute BP sitting:   118 / 70  (right arm)  Vitals Entered By: Marrion Coy, CNA (March 05, 2009 12:17 PM)  Physical Exam  General:  Well developed, well  nourished, in no acute distress. Head:  normocephalic and atraumatic Eyes:  PERRLA/EOM intact; conjunctiva and lids normal. Mouth:  Teeth, gums and palate normal. Oral mucosa normal. Neck:  Neck supple, no JVD. No masses, thyromegaly or abnormal cervical nodes, well healed right carotid endarterectomy scar Chest Wall:  no deformities or breast masses noted Lungs:  Clear bilaterally to auscultation and percussion. Abdomen:  Bowel sounds positive; abdomen soft and non-tender without masses, organomegaly, or hernias noted. No hepatosplenomegaly. Msk:  Back normal, normal gait. Muscle strength and tone normal. Extremities:  No clubbing or cyanosis. Neurologic:  Alert and oriented x 3. Skin:  Intact without lesions or rashes. Cervical Nodes:  no significant adenopathy Axillary Nodes:  no significant adenopathy Inguinal Nodes:  no significant adenopathy Psych:  Normal affect.   Detailed Cardiovascular Exam  Neck    Carotids: Carotids full and equal bilaterally without bruits.      Neck Veins: Normal, no JVD.    Heart    Inspection: no deformities or lifts noted.      Palpation: normal PMI with no thrills palpable.      Auscultation: regular rate and rhythm, S1, S2 without murmurs, rubs, gallops, or clicks.    Vascular    Abdominal Aorta: no palpable masses, pulsations, or audible bruits.      Femoral Pulses: normal femoral pulses bilaterally.      Pedal Pulses: pulses normal in all 4 extremities    Radial Pulses: normal radial pulses bilaterally.      Peripheral Circulation: no clubbing, cyanosis, or edema noted with normal capillary refill.     EKG  Procedure date:  03/05/2009  Findings:      sinus rhythm, rate 83, axis within normal limits, intervals within normal limits, no acute ST-T wave changes.  Impression & Recommendations:  Problem # 1:  HYPERTENSION (ICD-401.9) His blood pressure has now suddenly dropped and he is actually normotensive without taking any blood  pressure medicines. The reason for this is not clear. For now he will remain off of all medications for blood pressure. Because of chest pain and previous vascular disease I will be assessing him to rule out ischemia as a remote possibility for this. He would not be a to walk on a treadmill and so will have an adenosine Cardiolite.  Problem # 2:  ALCOHOL ABUSE (ICD-305.00) He actually says he has stopped drinking alcohol. I applauded this and encouraged continued abstinence.  Problem # 3:  TOBACCO ABUSE (ICD-305.1) We I again discussed the need to stop smoking.  Problem # 4:  CHEST PAIN (ICD-786.50) He has chest discomfort with known vascular disease. Stress testing is indicated as above. Orders: Nuclear Stress Test (Nuc Stress Test)  Patient Instructions: 1)  Your physician recommends that you schedule a follow-up appointment in: 12 months with Dr Antoine Poche 2)  Your physician recommends that you  continue on your current medications as directed. Please refer to the Current Medication list given to you today. 3)  Your physician has requested that you have an adenosine myoview.  For further information please visit [email or web address]  Please follow instruction sheet, as given.  Prevention & Chronic Care Immunizations   Influenza vaccine: Fluvax MCR  (10/05/2007)    Tetanus booster: 02/16/2007: Tdap    Pneumococcal vaccine: Not documented    H. zoster vaccine: Not documented  Colorectal Screening   Hemoccult: Not documented    Colonoscopy: Adenomatous Polyp  (01/12/2005)   Colonoscopy due: 01/2008  Other Screening   PSA: 4.50  (03/02/2007)   Smoking status: current  (02/16/2007)  Lipids   Total Cholesterol: 131  (02/21/2008)   LDL: 74  (02/21/2008)   LDL Direct: Not documented   HDL: 44.2  (02/21/2008)   Triglycerides: 65  (02/21/2008)  Hypertension   Last Blood Pressure: 118 / 70  (03/05/2009)   Serum creatinine: 1.3  (02/14/2009)   Serum potassium 4.7   (02/14/2009)  Self-Management Support :    Hypertension self-management support: Not documented

## 2010-02-13 NOTE — Assessment & Plan Note (Signed)
Summary: f/u on bp/cdj   Vital Signs:  Patient profile:   70 year old male Height:      73 inches Weight:      196.2 pounds Pulse rate:   60 / minute BP sitting:   142 / 90  Vitals Entered By: Shary Decamp (April 01, 2009 12:42 PM) CC: f/u on HTN, only bp med pt is on is metoprolol Comments BP READINGS @ HOME: 172/99, 151/91, 182/106, 149/88, 182/114, 156/85, 183/126, 189/110, 148/99, 184/96, 202/119, 179/107, 198/107, 151/96 Shary Decamp  April 01, 2009 12:50 PM    History of Present Illness: f/u   Current Medications (verified): 1)  Omeprazole 40 Mg Cpdr (Omeprazole) .Marland Kitchen.. 1 By Mouth Once Daily 2)  Lorazepam 0.5 Mg  Tabs (Lorazepam) .... 1/2 By Mouth Bid 3)  Bayer Aspirin 325 Mg Tabs (Aspirin) .Marland Kitchen.. 1 By Mouth Once Daily 4)  Metoprolol Tartrate 25 Mg Tabs (Metoprolol Tartrate) .... Take One Half Tablet Two Times A Day  Allergies (verified): 1)  ! Zoloft 2)  ! * Nicotine Patches 3)  ! * Zyban  Past History:  Past Medical History: Reviewed history from 02/14/2009 and no changes required. Carotid Artery Dz, s/p surgery 11-2008 Hypertension ------ COLONIC POLYPS, Cscope 08-18-05: tics, polyps... 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 Right carotid endarterectomy with Dacron patch angioplasty 11-2008 Appendectomy Knee surgery Foot surgery (fx.) Hip Replacement --R-- due to OA  (2005) Skin tags excised   TURP  Social History: Reviewed history from 02/14/2009 and no changes required. 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   Lungs:  normal respiratory effort, no intercostal retractions, no accessory muscle use, and decreased breath sounds Heart:  normal rate, regular rhythm, no murmur, and no gallop.   Extremities:  no edema   Impression & Recommendations:  Problem # 1:  HYPERTENSION (ICD-401.9) not well controlled, I was under the impression that he was taking amlodipine but he was not. plan: start amlodipine 5 mg, history of good tolerance to this medication continue metoprolol His updated medication list for this problem includes:    Metoprolol Tartrate 25 Mg Tabs (Metoprolol tartrate) .Marland Kitchen... Take one half tablet two times a day    Amlodipine Besylate 5 Mg Tabs (Amlodipine besylate) .Marland Kitchen... 1 a day  BP today: 142/90 Prior BP: 144/82 (03/06/2009)  Labs Reviewed: K+: 4.7 (02/14/2009) Creat: : 1.3 (02/14/2009)   Chol: 131 (02/21/2008)   HDL: 44.2 (02/21/2008)   LDL: 74 (02/21/2008)   TG: 65 (02/21/2008)  Problem # 2:  CHEST PAIN (ICD-786.50) recent stress test neg, likely non-cardiac Portable CXR 11-10 neg plan re-asses on RT C  Complete Medication List: 1)  Omeprazole 40 Mg Cpdr (Omeprazole) .Marland Kitchen.. 1 by mouth once daily 2)  Lorazepam 0.5 Mg Tabs (  Lorazepam) .... 1/2 by mouth bid 3)  Bayer Aspirin 325 Mg Tabs (Aspirin) .Marland Kitchen.. 1 by mouth once daily 4)  Metoprolol Tartrate 25 Mg Tabs (Metoprolol tartrate) .... Take one half tablet two times a day 5)  Amlodipine Besylate 5 Mg Tabs (Amlodipine besylate) .Marland Kitchen.. 1 a day  Patient Instructions: 1)  start amlodipine 5 mg 2)  nurse visit in two weeks for BP check Prescriptions: AMLODIPINE BESYLATE 5 MG TABS (AMLODIPINE BESYLATE) 1 a day  #90 x 1   Entered and Authorized by:   Elita Quick E.  Paz MD   Signed by:   Nolon Rod. Paz MD on 04/01/2009   Method used:   Electronically to        [id number]* (retail)       [address]       [place], [place]  [address]       Ph: [phone]       Fax: [phone]   RxID:   [id number]

## 2010-02-13 NOTE — Assessment & Plan Note (Signed)
Summary: BP CHECK//PH//PT HERE AT 12:30 PM  Medications Added LORAZEPAM 0.5 MG  TABS (LORAZEPAM) 1 by mouth two times a day METOPROLOL TARTRATE 25 MG TABS (METOPROLOL TARTRATE) take one  two times a day AMLODIPINE BESYLATE 10 MG TABS (AMLODIPINE BESYLATE) 1/2 by mouth once daily      Allergies Added:  Nurse Visit   Vital Signs:  Patient profile:   70 year old male Height:      73 inches Weight:      196 pounds BMI:     25.95 Pulse rate:   78 / minute BP sitting:   120 / 80  Vitals Entered By: Shary Decamp (April 15, 2009 12:45 PM) CC: bp check   Current Medications (verified): 1)  Omeprazole 40 Mg Cpdr (Omeprazole) .Marland Kitchen.. 1 By Mouth Once Daily 2)  Lorazepam 0.5 Mg  Tabs (Lorazepam) .Marland Kitchen.. 1 By Mouth Two Times A Day 3)  Bayer Aspirin 325 Mg Tabs (Aspirin) .Marland Kitchen.. 1 By Mouth Once Daily 4)  Metoprolol Tartrate 25 Mg Tabs (Metoprolol Tartrate) .... Take One  Two Times A Day 5)  Amlodipine Besylate 10 Mg Tabs (Amlodipine Besylate) .... 1/2 By Mouth Once Daily  Allergies (verified): 1)  ! Zoloft 2)  ! * Nicotine Patches 3)  ! * Zyban  Impression & Recommendations:  Problem # 1:  HYPERTENSION (ICD-401.9)  ambulatory BPs  elevated --184, 170, 173/100s accurate ambulatory BPs? will check his BP in the office twice this week, not charge, instructions His updated medication list for this problem includes:    Metoprolol Tartrate 25 Mg Tabs (Metoprolol tartrate) .Marland Kitchen... Take one  two times a day    Amlodipine Besylate 10 Mg Tabs (Amlodipine besylate) .Marland Kitchen... 1/2 by mouth once daily  Orders: No Charge Patient Arrived (NCPA0) (NCPA0)  Complete Medication List: 1)  Omeprazole 40 Mg Cpdr (Omeprazole) .Marland Kitchen.. 1 by mouth once daily 2)  Lorazepam 0.5 Mg Tabs (Lorazepam) .Marland Kitchen.. 1 by mouth two times a day 3)  Bayer Aspirin 325 Mg Tabs (Aspirin) .Marland Kitchen.. 1 by mouth once daily 4)  Metoprolol Tartrate 25 Mg Tabs (Metoprolol tartrate) .... Take one  two times a day 5)  Amlodipine Besylate 10 Mg Tabs  (Amlodipine besylate) .... 1/2 by mouth once daily   Patient Instructions: 1)   nurse visit in two days and again in 4 days  for BP check only    Orders Added: 1)  No Charge Patient Arrived (NCPA0) [NCPA0]

## 2010-02-13 NOTE — Progress Notes (Signed)
Summary: Records request  ---- Converted from flag ---- ---- 11/25/2009 5:30 PM, Jose E. Paz MD wrote: call urology Dr Jeralyn Bennett in [facility], get last labs and last 2 OV notes ------------------------------  I called their office at [phone] and was notified by automated machine that their office is closed. Will try back again tomorrow. Lucious Groves CMA  November 26, 2009 4:46 PM   Patient has not been seen there since 08/2008 but they will send what they can. Lucious Groves CMA  November 28, 2009 9:42 AM

## 2010-02-13 NOTE — Letter (Signed)
Summary: [facility] 5-20 & 09-06-08/[facility]  Notes [facility] 5-20 & 09-06-08[facility]   Imported By: Lanelle Bal 12/06/2009 10:30:33  _____________________________________________________________________  External Attachment:    Type:   Image     Comment:   External Document

## 2010-02-13 NOTE — Progress Notes (Signed)
Summary: Refill Request  Phone Note Refill Request Call back at [phone] Message from:  Pharmacy on May 21, 2009 11:03 AM  Refills Requested: Medication #1:  LORAZEPAM 0.5 MG  TABS 1 by mouth two times a day   Dosage confirmed as above?Dosage Confirmed   Supply Requested: 1 month   Last Refilled: 04/18/2009 [facility] [address]  Next Appointment Scheduled: 7.26.11 Initial call taken by: Harold Barban,  May 21, 2009 11:03 AM  Follow-up for Phone Call        ok 60 and 6 RF Follow-up by: [facility] E. Areana Kosanke MD,  May 22, 2009 8:52 AM    Prescriptions: LORAZEPAM 0.5 MG  TABS (LORAZEPAM) 1 by mouth two times a day  #60 x 6   Entered by:   Shary Decamp   Authorized by:   Nolon Rod. Eran Windish MD   Signed by:   Shary Decamp on 05/22/2009   Method used:   Printed then faxed to ...       [facility]  [address] [phone]* (retail)       [address]       [place], [place]  [address]       Ph: [phone]       Fax: [phone]   RxID:   [id number]

## 2010-02-13 NOTE — Assessment & Plan Note (Signed)
Summary: 2 MONTH OV//PH   Vital Signs:  Patient profile:   70 year old male Height:      73 inches Weight:      196 pounds BMI:     25.95 Pulse rate:   84 / minute BP sitting:   124 / 72  Vitals Entered By: Shary Decamp (May 06, 2009 1:10 PM) CC: rov Comments BP READINGS @ HOME 139/97, 153/93, 147/91, 153/95, 172/109, 146/90, 201/93, 158/97, 164/92, 159/87, 145/89, 97/62, 107/60, 186/112, 157/98, 177/103, 153/93, 173/105, 163/85, 126/82, 117/71, 155/94, 149/86 Shary Decamp  May 06, 2009 1:18 PM    History of Present Illness:  follow-up from the last office visit his BP was not well controlled, he was a started on amlodipine, he is tolerating it well. he is ambulatory BPs varies from 190/90 to 107/60   Allergies: 1)  ! Zoloft 2)  ! * Nicotine Patches 3)  ! * Zyban  Past History:  Past Medical History: Reviewed history from 02/14/2009 and no changes required. Carotid Artery Dz, s/p surgery 11-2008 Hypertension ------ COLONIC POLYPS, Cscope 08-18-05: tics, polyps... 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 Right carotid endarterectomy with Dacron patch angioplasty 11-2008 Appendectomy Knee surgery Foot surgery (fx.) Hip Replacement --R-- due to OA  (2005) Skin tags excised   TURP  Social History: Reviewed history from 02/14/2009 and no changes required. 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   Lungs:  normal respiratory effort, no intercostal retractions, no accessory muscle use, and normal breath sounds.   Heart:  normal rate, regular rhythm, no murmur, and no gallop.   Extremities:  no edema   Impression & Recommendations:  Problem # 1:  HYPERTENSION (ICD-401.9) patient has labile  blood pressure, historically he is easily overtreated  fortunately all his office BPs  are excellent I repeated the blood pressure myself today  in both arms and it was 135/90 bilaterally Plan: No change, see instructions His updated medication list for this problem includes:    Metoprolol Tartrate 25 Mg Tabs (Metoprolol tartrate) .Marland Kitchen... Take 1.5 tab  two times a day    Amlodipine Besylate 10 Mg Tabs (Amlodipine besylate) .Marland Kitchen... 1/2 by mouth once daily  Complete Medication List: 1)  Omeprazole 40 Mg Cpdr (Omeprazole) .Marland Kitchen.. 1 by mouth once daily 2)  Lorazepam 0.5 Mg Tabs (Lorazepam) .Marland Kitchen.. 1 by mouth two times a day 3)  Bayer Aspirin 325 Mg Tabs (Aspirin) .Marland Kitchen.. 1 by mouth once daily 4)  Metoprolol Tartrate 25 Mg Tabs (Metoprolol tartrate) .... Take 1.5 tab  two times a day 5)  Amlodipine Besylate 10 Mg Tabs (Amlodipine besylate) .... 1/2 by mouth once daily  Patient Instructions: 1)  check your BP twice a week, call if > 190/100 2)  see my nurse once a month for a BP check 3)  Please schedule a follow-up  appointment in 3 months .

## 2010-02-13 NOTE — Assessment & Plan Note (Signed)
Summary: PER [name], NURSE VISIT///[facility]   Nurse Visit   Vital Signs:  Patient profile:   70 year old male Height:      73 inches Weight:      196 pounds BMI:     25.95 Pulse rate:   80 / minute BP sitting:   130 / 82  (left arm)  Vitals Entered By: Shary Decamp (April 19, 2009 12:42 PM) CC: bp check, c/o of feeling tired Comments BP READINGS @ HOME: 184/115, 151/83, 196/115, 179/99, 215/131, 175/29M 186/116, 179/90, 142/93 BP @ home is taken after he has smoked cig.... Shary Decamp  April 19, 2009 12:44 PM    Allergies: 1)  ! Zoloft 2)  ! * Nicotine Patches 3)  ! * Zyban  Impression & Recommendations:  Problem # 1:  HYPERTENSION (ICD-401.9)   BP today within normal continue checking BPs here , pt to bring his  BP machine to see if it  is accurate His updated medication list for this problem includes:    Metoprolol Tartrate 25 Mg Tabs (Metoprolol tartrate) .Marland Kitchen... Take one  two times a day    Amlodipine Besylate 10 Mg Tabs (Amlodipine besylate) .Marland Kitchen... 1/2 by mouth once daily  BP today: 130/82 Prior BP: 120/80 (04/15/2009)  Labs Reviewed: K+: 4.7 (02/14/2009) Creat: : 1.3 (02/14/2009)   Chol: 131 (02/21/2008)   HDL: 44.2 (02/21/2008)   LDL: 74 (02/21/2008)   TG: 65 (02/21/2008)  Orders: No Charge Patient Arrived (NCPA0) (NCPA0)  Complete Medication List: 1)  Omeprazole 40 Mg Cpdr (Omeprazole) .Marland Kitchen.. 1 by mouth once daily 2)  Lorazepam 0.5 Mg Tabs (Lorazepam) .Marland Kitchen.. 1 by mouth two times a day 3)  Bayer Aspirin 325 Mg Tabs (Aspirin) .Marland Kitchen.. 1 by mouth once daily 4)  Metoprolol Tartrate 25 Mg Tabs (Metoprolol tartrate) .... Take one  two times a day 5)  Amlodipine Besylate 10 Mg Tabs (Amlodipine besylate) .... 1/2 by mouth once daily   Orders Added: 1)  No Charge Patient Arrived (NCPA0) [NCPA0]

## 2010-02-13 NOTE — Assessment & Plan Note (Signed)
Summary: 6 month rov/sl      Allergies Added:   Visit Type:  Follow-up Primary Provider:  Nolon Rod. Paz MD  CC:  PVD.  History of Present Illness: The patient presents for followup of PVD and hypertension. Since I last saw him he has had no new cardiovascular complaints. His blood pressure has been well regulated with the addition of amlodipine. He had blood work done the other day and I did take the liberty of reviewing this with him with an HDL of 47.5 and an LDL of 72. This is an excellent ratio. He unfortunately has not stop smoking though everybody talks to him about it. I doubt that he ever will. He's had no new cardiovascular complaints such as chest discomfort, neck or arm discomfort. He's had no new palpitations, presyncope or syncope.  Current Medications (verified): 1)  Omeprazole 40 Mg Cpdr (Omeprazole) .Marland Kitchen.. 1 By Mouth Once Daily 2)  Lorazepam 0.5 Mg  Tabs (Lorazepam) .Marland Kitchen.. 1 By Mouth Two Times A Day 3)  Bayer Aspirin 325 Mg Tabs (Aspirin) .Marland Kitchen.. 1 By Mouth Once Daily 4)  Metoprolol Tartrate 25 Mg Tabs (Metoprolol Tartrate) .... Take 1.5 Tab  Two Times A Day 5)  Amlodipine Besylate 10 Mg Tabs (Amlodipine Besylate) .... 1/2 By Mouth Once Daily 6)  Pravachol 40 Mg Tabs (Pravastatin Sodium) .... One P.o. Q. Day  Allergies (verified): 1)  ! Zoloft 2)  ! * Nicotine Patches 3)  ! * Zyban  Past History:  Past Medical History: Reviewed history from 02/14/2009 and no changes required. Carotid Artery Dz, s/p surgery 11-2008 Hypertension ------ COLONIC POLYPS, Cscope 08-18-05: tics, polyps... 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 Right carotid  endarterectomy with Dacron patch angioplasty 11-2008 Appendectomy Knee surgery Foot surgery (fx.) Hip Replacement --R-- due to OA  (2005) Skin tags excised   TURP  Review of Systems       As stated in the HPI and negative for all other systems.   Vital Signs:  Patient profile:   70 year old male Height:      73 inches Weight:      187 pounds BMI:     24.76 Pulse rate:   72 / minute Resp:     18 per minute BP sitting:   136 / 78  (right arm)  Vitals Entered By: Marrion Coy, CNA (September 04, 2009 4:08 PM)  Physical Exam  General:  Well developed, well nourished, in no acute distress. Head:  normocephalic and atraumatic Neck:  Neck supple, no JVD. No masses, thyromegaly or abnormal cervical nodes, well-healed carotid endarterectomy scar Chest Wall:  no deformities or breast masses noted Lungs:  Diminished breath sounds without wheezing or crackles Heart:  S1 and S2 within normal limits, no S3, no S4, no clicks, no rubs, , no murmurs Abdomen:  Bowel sounds positive; abdomen soft and non-tender without masses, organomegaly, or hernias noted. No hepatosplenomegaly. Msk:  Lower extremity and upper extremity muscle wasting Pulses:  pulses normal in all 4 extremities Extremities:  No clubbing or cyanosis. Skin:  Intact without lesions or rashes. Cervical Nodes:  no significant adenopathy Psych:  Normal affect.  EKG  Procedure date:  09/04/2009  Findings:      Sinus rhythm, rate 72, axis within normal limits, intervals within normal limits, no acute ST-T wave changes.  Impression & Recommendations:  Problem # 1:  CAROTID ARTERY DISEASE (ICD-433.10) This is followed by Dr. Edilia Bo.  The patient understands that he needs to be more aggressive with risk reduction. Orders: EKG w/ Interpretation (93000)  Problem # 2:  HYPERTENSION (ICD-401.9) His blood pressure is now well controlled and he has non-of the hypotension that he had previously. He will remain on the meds as  listed.  Problem # 3:  TOBACCO ABUSE (ICD-305.1) We again discussed the need to stop smoking.  Problem # 4:  CHEST PAIN (ICD-786.50) He had a negative stress perfusion study earlier this year. He has had no further chest pain. Orders: EKG w/ Interpretation (93000)   Patient Instructions: 1)  Your physician recommends that you schedule a follow-up appointment as needed 2)  Your physician recommends that you continue on your current medications as directed. Please refer to the Current Medication list given to you today.

## 2010-02-13 NOTE — Assessment & Plan Note (Signed)
Summary: Cardiology Nuclear Study  Nuclear Med Background Indications for Stress Test: Evaluation for Ischemia, Post Hospital  Indications Comments: 03/01/09 [facility] ER Hypotension, Vertigo  History: Myocardial Perfusion Study  History Comments: '96 MPS:OK per patient; 11/10 (R) CEA  Symptoms: Chest Pain, Dizziness, Fatigue  Symptoms Comments: Last episode of GE:XBMW night.   Nuclear Pre-Procedure Cardiac Risk Factors: Carotid Disease, Hypertension, Smoker Caffeine/Decaff Intake: None NPO After: 5:30 PM Lungs: Clear.  O2 Sat 98% on RA. IV 0.9% NS with Angio Cath: 24g     IV Site: (R) Hand IV Started by: Irean Hong RN Chest Size (in) 46     Height (in): 73 Weight (lb): 195 BMI: 25.82  Nuclear Med Study 1 or 2 day study:  1 day     Stress Test Type:  Eugenie Birks Reading MD:  Dietrich Pates, MD     Referring MD:  Rollene Rotunda, MD Resting Radionuclide:  Technetium 81m Tetrofosmin     Resting Radionuclide Dose:  11.0 mCi  Stress Radionuclide:  Technetium 41m Tetrofosmin     Stress Radionuclide Dose:  33.0 mCi   Stress Protocol   Lexiscan: 0.4 mg   Stress Test Technologist:  Rea [employer] CMA-N     Nuclear Technologist:  Burna Mortimer Deal RT-N  Rest Procedure  Myocardial perfusion imaging was performed at rest 45 minutes following the intravenous administration of Myoview Technetium 28m Tetrofosmin.  Stress Procedure  The patient received IV Lexiscan 0.4 mg over 15-seconds.  Myoview injected at 30-seconds.  There were no significant changes with lexiscan.  He did c/o chest pain with lexiscan.  Quantitative spect images were obtained after a 45 minute delay.  QPS Raw Data Images:  Soft tissue (diaphragm, bowel activity) underlie heart. Stress Images:  mild thinning in the inferoseptal (base).  Otherwise normal perfusion. Rest Images:  No significant change in the rest images. Transient Ischemic Dilatation:  1.07  (Normal <1.22)  Lung/Heart Ratio:  .20  (Normal <0.45)  Quantitative  Gated Spect Images QGS EDV:  90 ml QGS ESV:  35 ml QGS EF:  61 %   Overall Impression  Exercise Capacity: Lexiscan  BP Response: Normal blood pressure response. Clinical Symptoms: Mild chest pain/dyspnea. ECG Impression: Less than 1 mm ST depression in V6 Overall Impression: Normal perfusion.   NO ischemia or scar.  LVEF 61%.  Appended Document: Cardiology Nuclear Study**lmtcb** Negative for evidence of ischemia.  Appended Document: Cardiology Nuclear Study pt aware of results.  Requested pt follow up with his primary care MD about which meds to restart and when. Also to follow up with any other possible causes of chest pains.  Pt states BP is erratic and was advised that Dr Drue Novel could follow that as well.   Pt states understanding

## 2010-02-13 NOTE — Assessment & Plan Note (Signed)
Summary: 6 mth fu/ns/kdc   Vital Signs:  Patient profile:   70 year old male Height:      73 inches Weight:      210.2 pounds BMI:     27.83 Pulse rate:   86 / minute BP sitting:   140 / 40  Vitals Entered By: Dena Billet CC: rov Comments pt. states he has no eneryg, sleeps all the time, aching in all joints +gas ever since surgery, no appetitie insurance asked to change benicar to lisinopril   History of Present Illness: here w/  a good friend, Britta Mccreedy, who moved w/ him temporarily to help. she is leaving tomorrow  several c/o: --decreased appetite -- no energy since he was operated on last year denies orthopnea or shortness of breath up needs to chest pain, left-sided, last to 3 seconds, no pattern, no associated nausea or diaphoresis --"I had no ambition" awake  few hours a day, watch TV most of the time admits to depression, he is tearful and emotional no suicidal he has been on BuSpar for many years he  smokes  more than a pack a day he still drinks at least one bottle of cherry daily -- +gas ever since surgery (flatus and burping) denies heartburn per se no nausea, vomiting or diarrhea no blood in the stools --hurts in different joints on and off, some days in the ankles, sometimes in the  knees, shoulders, hips not on  cholesterol medication denies any puffiness or swelling in the joints no F, no HA -- ambulatory BPs varies  from the 140/80 to 200s / 106 insurance asked to change benicar to lisinopril --his gait is noted  to be difficult today, he states his gait has been getting worse for many months, even before the carotid surgery.  He never had any formal physical therapy   Allergies: 1)  ! Zoloft 2)  ! * Nicotine Patches 3)  ! * Zyban  Past History:  Past Medical History: Carotid Artery Dz, s/p surgery 11-2008 Hypertension ------ COLONIC POLYPS, Cscope 08-18-05: tics, polyps... all hyperplastic GERD, Barretts Esoph. 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   Neck:  no HJR.   Lungs:  normal respiratory effort, no intercostal retractions, no accessory muscle use, and normal breath sounds.   Heart:  normal rate, regular rhythm, no murmur, and no gallop.   Abdomen:  soft, non-tender, no distention, no masses, no guarding, and no rigidity.   Extremities:  no LE edema hands and wrists are normal to inspection and palpation Psych:  Oriented X3 and good eye contact.  not obviously anxious or depressed, he does have a flat affect   Impression & Recommendations:  Problem # 1:  here with multiple issues he is clearly depressed, depression by itself may account for most of his symptoms BP not well controlled ongoing tobacco and alcohol abuse generalized fatigue arthralgias his hospital labs from  November 2010 are  reviewed: 12  was normal, hemoglobin 12.2, potassium 4.0, creatinine 1.0, it is normal, TSH 2.0 Plan: Switch buspar to  Prozac discontinue Benicar as his blood pressures  is not  well controlled and also because insurance constraints star  lisinopril and HCTZ labs counseled about tobacco and alcohol-- ?AA reasses gait on RTC , PT?  Problem # 2:  FATIGUE (ICD-780.79) see #1 Orders: TLB-TSH (Thyroid Stimulating Hormone) (84443-TSH)  Problem # 3:  ALCOHOL ABUSE (ICD-305.00) see #1 Orders: TLB-B12 + Folate Pnl (11914_78295-A21/HYQ) TLB-Hepatic/Liver Function Pnl (80076-HEPATIC)  Problem # 4:  HYPERTENSION (ICD-401.9) see #1 The following medications  were removed from the medication list:    Benicar 40 Mg Tabs (Olmesartan medoxomil) .Marland Kitchen... 1 by mouth once daily His updated medication list for this problem includes:    Amlodipine Besylate 10 Mg Tabs (Amlodipine besylate) .Marland Kitchen... 1 by mouth once daily    Lisinopril 40 Mg Tabs (Lisinopril) .Marland Kitchen... 1 by mouth daily    Hydrochlorothiazide 25 Mg Tabs (Hydrochlorothiazide) ..... One by mouth every morning  Orders: Venipuncture (65784) TLB-BMP (Basic Metabolic Panel-BMET) (80048-METABOL) TLB-CBC Platelet - w/Differential (85025-CBCD)  Problem # 5:  DEPRESSION (ICD-311) see #1 The following medications were removed from the medication list:    Buspar 15 Mg Tabs (Buspirone hcl) .Marland Kitchen... 1/2 by mouth two times a day His updated medication list for this problem includes:    Lorazepam 0.5 Mg Tabs (Lorazepam) .Marland Kitchen... Take one tablet twice daily as needed.    Prozac 10 Mg Caps (Fluoxetine hcl) ..... One a day for 10 days, then two a day  Problem # 6:  time spent > 55 min  due to complex HPI, chart review, multiple med changes and careful discussion  of plan with the patient and his friend >50% ot time counseling   Complete Medication List: 1)  Amlodipine Besylate 10 Mg Tabs (Amlodipine besylate) .Marland Kitchen.. 1 by mouth once daily 2)  Omeprazole 40 Mg Cpdr (Omeprazole) .Marland Kitchen.. 1 by mouth once daily 3)  Lorazepam 0.5 Mg Tabs (Lorazepam) .... Take one tablet twice daily as needed. 4)  Bayer Aspirin 325 Mg Tabs (Aspirin) .Marland Kitchen.. 1 by mouth once daily 5)  Viagra 100 Mg Tabs (Sildenafil citrate) .... Take half or one a day as needed 6)  Centrum Performance Tabs (Specialty vitamins products) .... Daily 7)  Folic Acid 1 Mg Tabs (Folic acid) .Marland Kitchen.. 1 by mouth once daily 8)  Vitamin E  .... Daily 9)  Thiamine Hcl 100 Mg Tabs (Thiamine hcl) .Marland Kitchen.. 1 by mouth once daily 10)  Mvi  .Marland Kitchen.. 1tab every day 11)  Biofit  .... Bid 12)  Prozac 10 Mg Caps (Fluoxetine hcl) .... One a day for 10 days, then two a day 13)  Lisinopril 40 Mg Tabs  (Lisinopril) .Marland Kitchen.. 1 by mouth daily 14)  Hydrochlorothiazide 25 Mg Tabs (Hydrochlorothiazide) .... One by mouth every morning  Patient Instructions: 1)   for the next 10 days,decrease Buspar to 1/2  a day  and start Prozac one a day  2)  after 10 days, discontinue BuSpar and increase Prozac to two tablets a day 3)  discontinue Benicar 4)  start lisinopril and HCTZ 5)  Please schedule a follow-up appointment in 2 weeks.  Prescriptions: HYDROCHLOROTHIAZIDE 25 MG TABS (HYDROCHLOROTHIAZIDE) one by mouth every morning  #30 x 1   Entered and Authorized by:   Elita Quick E. Abbagail Scaff MD   Signed by:   Nolon Rod. Arhaan Chesnut MD on 02/14/2009   Method used:   Print then Give to Patient  RxID:   [id number] LISINOPRIL 40 MG TABS (LISINOPRIL) 1 by mouth daily  #30 x 1   Entered and Authorized by:   Elita Quick E. Myka Lukins MD   Signed by:   Nolon Rod. Arnita Koons MD on 02/14/2009   Method used:   Print then Give to Patient   RxID:   [id number] PROZAC 10 MG CAPS (FLUOXETINE HCL) one a day for 10 days, then two a day  #60 x 1   Entered and Authorized by:   Elita Quick E. Sherial Ebrahim MD   Signed by:   Nolon Rod. Domenic Schoenberger MD on 02/14/2009   Method used:   Print then Give to Patient   RxID:   [id number]

## 2010-02-13 NOTE — Progress Notes (Signed)
Summary: FYI elevated BP  Phone Note Call from Patient Call back at Home Phone [phone]   Caller: Patient Summary of Call: pt called says bp up and down, takiing Amlodipine 10mg  was taking 1/2  as directed; pt  increased to 1 whole pill on his own  a week after ov 03/06/09 due to increasing bp. --BP 222/107 the highest,  and 125/74 the lowest  per patient --BP175/107 an hour ago Initial call taken by: Kandice Hams,  March 20, 2009 11:45 AM  Follow-up for Phone Call        continue with amlodipine 10 mg once a day start metoprolol 25 mg half tablet b.i.d. office visit 10 days Jose E. Paz MD  March 20, 2009 12:54 PM   spoke w/ patient aware of additional medication to be added and appt scheduled to f/u in 10 days...Marland KitchenMarland KitchenDoristine Devoid  March 20, 2009 1:41 PM     New/Updated Medications: METOPROLOL TARTRATE 25 MG TABS (METOPROLOL TARTRATE) take one half tablet two times a day Prescriptions: METOPROLOL TARTRATE 25 MG TABS (METOPROLOL TARTRATE) take one half tablet two times a day  #30 x 1   Entered by:   Doristine Devoid   Authorized by:   Nolon Rod. Paz MD   Signed by:   Doristine Devoid on 03/20/2009   Method used:   Electronically to        [id number]* (retail)       [address]       [place], [place]  [address]       Ph: [phone]       Fax: [phone]   RxID:   [id number]

## 2010-02-13 NOTE — Assessment & Plan Note (Signed)
Summary: followup frome ED/alr   Vital Signs:  Patient profile:   70 year old male Height:      73 inches Weight:      199.4 pounds BMI:     26.40 Pulse rate:   93 / minute BP sitting:   144 / 82  Vitals Entered By: Shary Decamp (March 06, 2009 12:48 PM) CC: rov   History of Present Illness: @  last office visit 2-18 , he had hypotension and was sent to the ER ER records reviewed initial BP was 100/66, got IVF, at the time of the discharge from the ER blood pressure was 123/69 he had few WBCs in the urine, urine culture negative potassium 3.5, creatinine 1.8 WBC 12.4, hemoglobin 13.5, platelets 252 patient was discharged home on cefuroxime-- antivert  he was also seen by cardiology 2-22, they plan to do a stress test for evaluation of chest pain  Current Medications (verified): 1)  Omeprazole 40 Mg Cpdr (Omeprazole) .... Hold 2)  Lorazepam 0.5 Mg  Tabs (Lorazepam) .... Hold 3)  Bayer Aspirin 325 Mg Tabs (Aspirin) .... Hodl  Allergies (verified): 1)  ! Zoloft 2)  ! * Nicotine Patches 3)  ! * Zyban  Past History:  Past Medical History: Reviewed history from 02/14/2009 and no changes required. Carotid Artery Dz, s/p surgery 11-2008 Hypertension ------ COLONIC POLYPS, Cscope 08-18-05: tics, polyps... 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 Right carotid endarterectomy with Dacron patch angioplasty 11-2008 Appendectomy Knee surgery Foot surgery (fx.) Hip Replacement --R-- due to OA  (2005) Skin tags excised   TURP  Review of Systems       since he left the hospital he is feeling better he is less weak still dizzy sometimes, still  coughing sometimes which is a chronic problem his most recent amb.  BP are  in the 140 to 160 range  Physical Exam  General:  alert and well-developed.  alert and well-developed.   Lungs:  normal respiratory effort, no intercostal retractions, no accessory muscle use, and decreased breath sounds Heart:  normal rate, regular rhythm, no murmur, and no gallop.   Extremities:  no edema   Impression & Recommendations:  Problem # 1:  HYPERTENSION (ICD-401.9) recent hypotension likely due to overtreatment his ambulatory blood pressures are in the 140 to 160 range currently I suspect that his BP will need treatment in the future Will restart only amlodipine, only half tablet as needed, see instructions  The following medications were removed from the medication list:    Amlodipine Besylate 10 Mg Tabs (Amlodipine besylate) ..... Hold    Lisinopril 40 Mg Tabs (Lisinopril) ..... Hold    Hydrochlorothiazide 25 Mg Tabs (Hydrochlorothiazide) ..... Hold  The following medications were removed from the medication list:    Amlodipine Besylate 10 Mg Tabs (Amlodipine besylate) ..... Hold    Lisinopril 40 Mg Tabs (Lisinopril) ..... Hold    Hydrochlorothiazide 25 Mg Tabs (Hydrochlorothiazide) ..... Hold  Problem # 2:  DEPRESSION (ICD-311)  restart Prozac? "heart doctor said is not a good idea" plan: observe The following medications were removed from the medication list:    Prozac 10 Mg Caps (Fluoxetine  hcl) ..... Hold His updated medication list for this problem includes:    Lorazepam 0.5 Mg Tabs (Lorazepam) ..... Hold  His updated medication list for this problem includes:    Lorazepam 0.5 Mg Tabs (Lorazepam) ..... Hold    Prozac 10 Mg Caps (Fluoxetine hcl) ..... One a day for two weeks, then two a day  Problem # 3:  CHEST PAIN (ICD-786.50) to have a stress test   Complete Medication List: 1)  Omeprazole 40 Mg Cpdr (Omeprazole) .... Hold 2)  Lorazepam 0.5 Mg Tabs (Lorazepam) ....  Hold 3)  Bayer Aspirin 325 Mg Tabs (Aspirin) .... Hodl  Patient Instructions: 1)  check your  BP twice a day 2)  restart amlodipine 10 mg half tablet daily if the BP is consistently more than 160/90 3)  Please schedule a follow-up appointment in 2 months.

## 2010-02-13 NOTE — Assessment & Plan Note (Signed)
Summary: PER [name], BP CHECK///SPH/ RECOMMENDATIONS RE BP? PT CALLED  Medications Added METOPROLOL TARTRATE 25 MG TABS (METOPROLOL TARTRATE) take 1.5 tab  two times a day       Nurse Visit   Vital Signs:  Patient profile:   70 year old male BP sitting:   130 / 80  Vitals Entered By: [name] (April 26, 2009 12:32 PM) CC: BP CHECK Comments BP READING AT HOME;  04/19/09 AM 143/93, PM=169/91,  04/20/09 AM 154/92, PM=131/77  04/21/09 AM  177/107, PM=155/94  04/22/09 AM 202/110, PM=171/88 --04/23/09 AM 167/99, PM= 152/103,  04/24/09 AM 193/117 PM=162/98  04/25/09 AM 171/105 PM=152/81  04/26/09 AM 139/97 **NOTE PT HAS 3  BP PILLS LEFT METOPRLOL AND USES [facility] [address] Current dose; Metoprolol 25mg  1 two times a day PT INFORMED OF DR [name] RECOMMENDATIONS AND RX FAXED TO [facility] .[name]  April 29, 2009 10:48 AM    Impression & Recommendations:  Problem # 1:  HYPERTENSION (ICD-401.9) BP at the ALT is okay, ambulatory BPs is lined elevated Plan: Continue amlodipine increase metoprolol from one tablet to 1.5 tablet two times a day----call Rx  called w/ BP readings in two weeks His updated medication list for this problem includes:    Metoprolol Tartrate 25 Mg Tabs (Metoprolol tartrate) .Marland Kitchen... Take 1.5 tab  two times a day    Amlodipine Besylate 10 Mg Tabs (Amlodipine besylate) .Marland Kitchen... 1/2 by mouth once daily  Orders: No Charge Patient Arrived (NCPA0) (NCPA0)  Complete Medication List: 1)  Omeprazole 40 Mg Cpdr (Omeprazole) .Marland Kitchen.. 1 by mouth once daily 2)  Lorazepam 0.5 Mg Tabs (Lorazepam) .Marland Kitchen.. 1 by mouth two times a day 3)  Bayer Aspirin 325 Mg Tabs (Aspirin) .Marland Kitchen.. 1 by mouth once daily 4)  Metoprolol Tartrate 25 Mg Tabs (Metoprolol tartrate) .... Take 1.5 tab  two times a day 5)  Amlodipine Besylate 10 Mg Tabs (Amlodipine besylate) .... 1/2 by mouth once daily   Allergies: 1)  ! Zoloft 2)  ! * Nicotine Patches 3)  ! * Zyban  Orders Added: 1)  No Charge Patient  Arrived (NCPA0) [NCPA0] Prescriptions: METOPROLOL TARTRATE 25 MG TABS (METOPROLOL TARTRATE) take 1.5 tab  two times a day  #90 x 1   Entered by:   [name]   Authorized by:   Nicolas Rod. Paz MD   Signed by:   [name] on 04/29/2009   Method used:   Faxed to ...       [facility] [id number]* (retail)       [address]       [place], [place]  [address]       Ph: [phone]       Fax: [phone]   RxID:   [id number]

## 2010-02-13 NOTE — Assessment & Plan Note (Signed)
Summary: cpx/fasting//kn   Vital Signs:  Patient profile:   70 year old male Height:      73 inches Weight:      192.50 pounds O2 Sat:      94 % on Room air Pulse rate:   72 / minute Pulse rhythm:   regular BP sitting:   130 / 82  (left arm) Cuff size:   large  Vitals Entered By: Army Fossa CMA (November 25, 2009 9:40 AM)  O2 Flow:  Room air CC: CPX, not fsating  Comments [facility] [address] says he had pneumovax last year here?    History of Present Illness: Here for Medicare AWV:  1.   Risk factors based on Past M, S, F history:reviewed 2.   Physical Activities: not very active, minimal home chores  3.   Depression/mood: chronic depression, not on  meds , has declined meds before  4.   Hearing: normal, no problems noted w/ normal conversation  5.   ADL's: able to take care of himself so far, very limited cooking, no cleaning (has somebody                        helping him) 6.   Fall Risk: fell last week , has occasionally falls, last PT eval years ago. Rec PT eval  7.   Home Safety: does feel safe at home 8.   Height, weight, &visual acuity: see VS, wears a contact lens L, cataract surgery R... good                         vision 9.   Counseling:  see a/p  10.   Labs ordered based on risk factors: cholesterol is up to date, PSA reportedly per urology 11.           Referral Coordination, if needed 12.           Care Plan, see assessment and plan 13.            Cognitive Assessment, motor habilities  limited d/t OA, cognition seems intact  in addition we discussed the following   Hypertension-- ambulatory BPs weekly, WNL COLONIC POLYPS,  Barretts Esoph-- due for endoscopies, does not like to be schedule at this time BPH-----  has not seen urology in a while  OA-- still has moderate to severe pain at the R hip, ortho told patient is a "back issue". Likes a second [employer]  Alcohol-Tobacco     Alcohol drinks/day:  <1  Current Medications (verified): 1)  Omeprazole 40 Mg Cpdr (Omeprazole) .Marland Kitchen.. 1 By Mouth Once Daily 2)  Lorazepam 0.5 Mg  Tabs (Lorazepam) .Marland Kitchen.. 1 By Mouth Two Times A Day 3)  Bayer Aspirin 325 Mg Tabs (Aspirin) .Marland Kitchen.. 1 By Mouth Once Daily 4)  Metoprolol Tartrate 25 Mg Tabs (Metoprolol Tartrate) .... Take 1.5 Tab  Two Times A Day 5)  Amlodipine Besylate 10 Mg Tabs (Amlodipine Besylate) .... 1/2 By Mouth Once Daily 6)  Pravachol 40 Mg Tabs (Pravastatin Sodium) .... One P.o. Q. Day  Allergies (verified): 1)  ! Zoloft 2)  ! * Nicotine Patches 3)  ! * Zyban  Past History:  Past Medical History: Carotid Artery Dz, s/p surgery 11-2008 Hypertension ------ COLONIC POLYPS, Cscope 08-18-05: tics, polyps... all hyperplastic GERD, Barretts Esoph. (per Bx 08-2005)  and HH--f/u Dr Bosie Clos Anal warts, ANAL FISSURE, HX OF  ,  HEMORRHOIDS ------ SKIN Ca :  SQUAMOUS CELL , L side of face; s/p removal aprox 2009 BPH----- f/u w/ Dr Etta Grandchild, s/p procedure 01-30-2008 Hx of PANIC ATTACK  OA Depression  Past Surgical History: Right carotid endarterectomy with Dacron patch angioplasty 11-2008 Appendectomy Knee surgery Foot surgery (fx.) Hip Replacement --R-- due to OA  (2005) Skin tags excised   TURP wears a contact lens L, cataract surgery R... good vision  Family History: Reviewed history from 02/21/2008 and no changes required. Father: Lung CA mets to brain (died) Mother: HTN,  arthritis CAD - no HTN - M DM - P. Aunt stroke - PGF colon Ca - no prostate Ca - no  Social History: Retired divorce 2 kids, they live in [place], [place] mom lives w/ him,89 y/o bed ridden he is a single child  Current Smoker-- > 1 ppd  + ETOH abuse -- "have a drink now and then" ("4-5 drinks a week") Drug use-no Regular exercise-no  Review of Systems General:  Denies fever and weight loss. CV:  Denies chest pain or discomfort and swelling of feet. Resp:  Denies cough, sputum productive, and  wheezing. GI:  Denies bloody stools, nausea, and vomiting. GU:  Denies dysuria, hematuria, urinary frequency, and urinary hesitancy; occasionally incontinence .  Physical Exam  General:  alert and well-developed.   Lungs:  normal respiratory effort, no intercostal retractions, no accessory muscle use, and normal breath sounds.   Heart:  normal rate, regular rhythm, no murmur, and no gallop.   Abdomen:  soft, non-tender, no distention, no masses, no guarding, and no rigidity.   Extremities:  no edema Psych:  Oriented X3, normally interactive, and good eye contact.  seems to be tired but not particulalrly anxious or depressed   Impression & Recommendations:  Problem # 1:  PREVENTIVE HEALTH CARE (ICD-V70.0)  Td  ~ 2009 pneumonia shot < 5 years ago per patient , no documentation flu shot today    Cscope  2007--- 3 hyperplastic polyps  Dr Bosie Clos , was due for a colonoscopy 2010 , not ready to have one done. States likes to do it early next year. Will re asses on RTC  counseled about diet and exercise Counseled about tobacco- alcohol abuse Referral to physical therapy reference: Frequent falls  Orders: Medicare -1st Annual Wellness Visit [phone])  Problem # 2:  BARRETTS ESOPHAGUS (ICD-530.85) was due for a EGD in 2010 not ready  to have it just yet.     Problem # 3:  CAROTID ARTERY DISEASE (ICD-433.10) this is followed by Dr. Edilia Bo.  His updated medication list for this problem includes:    Bayer Aspirin 325 Mg Tabs (Aspirin) .Marland Kitchen... 1 by mouth once daily    Problem # 4:  OSTEOARTHRITIS (ICD-715.90) needs a second opinion in reference to his hip pain; local orthopedic surgeon is Dr. Lajoyce Corners (see ROS) Referred to [facility]  His updated medication list for this problem includes:    Bayer Aspirin 325 Mg Tabs (Aspirin) .Marland Kitchen... 1 by mouth once daily  Orders: Orthopedic Referral (Ortho)  Problem # 5:  HYPERTENSION (ICD-401.9) at goal  His updated medication list for this  problem includes:    Metoprolol Tartrate 25 Mg Tabs (Metoprolol tartrate) .Marland Kitchen... Take 1.5 tab  two times a day    Amlodipine Besylate 10 Mg Tabs (Amlodipine besylate) .Marland Kitchen... 1/2 by mouth once daily  BP today: 130/82 Prior BP: 136/78 (09/04/2009)  Labs Reviewed: K+: 4.7 (02/14/2009) Creat: : 1.3 (02/14/2009)   Chol: 142 (09/02/2009)  HDL: 47.50 (09/02/2009)   LDL: 72 (09/02/2009)   TG: 115.0 (09/02/2009)  Orders: Venipuncture ([address]109) TLB-BMP (Basic Metabolic Panel-BMET) (80048-METABOL) TLB-CBC Platelet - w/Differential (85025-CBCD) Specimen Handling ([address])  Problem # 6:  BENIGN PROSTATIC HYPERTROPHY, HX OF (ICD-V13.8) past medical history states he had a procedure done by Dr. Etta Grandchild 2010 he has no recollection of that report he has seen Dr. Jeralyn Bennett  a month ago Plan: get  records    Complete Medication List: 1)  Omeprazole 40 Mg Cpdr (Omeprazole) .Marland Kitchen.. 1 by mouth once daily 2)  Lorazepam 0.5 Mg Tabs (Lorazepam) .Marland Kitchen.. 1 by mouth two times a day 3)  Bayer Aspirin 325 Mg Tabs (Aspirin) .Marland Kitchen.. 1 by mouth once daily 4)  Metoprolol Tartrate 25 Mg Tabs (Metoprolol tartrate) .... Take 1.5 tab  two times a day 5)  Amlodipine Besylate 10 Mg Tabs (Amlodipine besylate) .... 1/2 by mouth once daily 6)  Pravachol 40 Mg Tabs (Pravastatin sodium) .... One p.o. q. day  Other Orders: Flu Vaccine 67yrs + MEDICARE PATIENTS (U9811) Administration Flu vaccine - MCR (B1478) Physical Therapy Referral (PT)  Patient Instructions: 1)  Please schedule a follow-up appointment in 4 to 6 months .    Orders Added: 1)  Flu Vaccine 103yrs + MEDICARE PATIENTS [Q2039] 2)  Administration Flu vaccine - MCR [G0008] 3)  Venipuncture [36415] 4)  TLB-BMP (Basic Metabolic Panel-BMET) [80048-METABOL] 5)  TLB-CBC Platelet - w/Differential [85025-CBCD] 6)  Specimen Handling [99000] 7)  Orthopedic Referral [Ortho] 8)  Physical Therapy Referral [PT] 9)  Est. Patient Level III [29562] 10)  Medicare -1st Annual Wellness  Visit [G0438]  Flu Vaccine Consent Questions     Do you have a history of severe allergic reactions to this vaccine? no    Any prior history of allergic reactions to egg and/or gelatin? no    Do you have a sensitivity to the preservative Thimersol? no    Do you have a past history of Guillan-Barre Syndrome? no    Do you currently have an acute febrile illness? no    Have you ever had a severe reaction to latex? no    Vaccine information given and explained to patient? yes    Are you currently pregnant? no    Lot Number:[id number]   Exp Date:07/12/2010   Site Given  Left Deltoid IM   Risk Factors:     Drinks per day:  <1  .lbmedflu    Appended Document: cpx/fasting//kn we got OV notes from Dr Jeralyn Bennett May and August 2010

## 2010-02-13 NOTE — Progress Notes (Signed)
Summary: elevated bp  Phone Note Call from Patient Call back at Home Phone [phone]   Details for Reason: ELEVATED BP READINGS, HAS OV FOR BP CHECK ON MONDAY 04/15/09 Summary of Call: 212/124, 150/87, 173/101, 206/114, 183/115, 193/111, 190/111, 141/87, 134/91 meds  amlodipine 10mg  1/2 day metoprolol 25 1/2 two times a day Shary Decamp  April 11, 2009 12:14 PM   Follow-up for Phone Call        increase metoprolol 25 to one tablet b.i.d. Follow-up by: Nolon Rod. Paz MD,  April 11, 2009 12:48 PM  Additional Follow-up for Phone Call Additional follow up Details #1::        pt informed of Dr Drue Novel recommendations to increase med to 1 two times a day, keep log of BP readings and appt on mon 04/15/09 .Kandice Hams  April 11, 2009 12:56 PM  Additional Follow-up by: Kandice Hams,  April 11, 2009 12:56 PM

## 2010-02-13 NOTE — Miscellaneous (Signed)
Summary: labs from medical center urology   Clinical Lists Changes  Observations: Added new observation of PSA: 0.73 ng/mL (05/31/2008 15:43)

## 2010-02-13 NOTE — Progress Notes (Signed)
Summary: Pain rx/refused appt  Phone Note Call from Patient Call back at Home Phone [phone]   Summary of Call: Patient left message on triage that he is in severe pain and would like prescription called in.   I spoke witht he patient and told him that we would need him to come in for office visit. Patient notes that he had hip surgery and has not been right since. He is supposed to see MD in [place] for 2nd opinion. I asked if he talked with MD and he states that he has "several times". Patient refuses to come in for office visit/eval stating that he cannot walk. Patient refused appt. Please advise. Initial call taken by: Lucious Groves CMA,  January 15, 2010 9:11 AM  Follow-up for Phone Call        he did mention severe pain and he has been referred to orthopedic. Call in Vicodin 5 mg:  one or 2 tablets every 8 hours as needed, #40, no refills. Will cause drowsiness. Brissia Delisa E. Dao Mearns MD  January 15, 2010 5:04 PM   Additional Follow-up for Phone Call Additional follow up Details #1::        Patient notified. Additional Follow-up by: Lucious Groves CMA,  January 16, 2010 9:06 AM    New/Updated Medications: VICODIN 5-500 MG TABS (HYDROCODONE-ACETAMINOPHEN) one or 2 tablets every 8 hours as needed, Will cause drowsiness. Prescriptions: VICODIN 5-500 MG TABS (HYDROCODONE-ACETAMINOPHEN) one or 2 tablets every 8 hours as needed, Will cause drowsiness.  #40 x 0   Entered by:   Lucious Groves CMA   Authorized by:   Nolon Rod. Azazel Franze MD   Signed by:   Lucious Groves CMA on 01/16/2010   Method used:   Telephoned to ...       [facility] [address] #[id number]* (retail)       [address]       [place], [place]  [address]       Ph: [phone]       Fax: [phone]   RxID:   [id number]

## 2010-02-19 NOTE — Progress Notes (Signed)
Summary: Refill Request  Phone Note Refill Request Call back at [phone] Message from:  Pharmacy on February 10, 2010 11:16 AM  Refills Requested: Medication #1:  PRAVACHOL 40 MG TABS one p.o. q. day   Dosage confirmed as above?Dosage Confirmed   Supply Requested: 30   Last Refilled: 01/03/2010 [facility] [address]   Next Appointment Scheduled: 5.14.12 Initial call taken by: Harold Barban,  February 10, 2010 11:16 AM    Prescriptions: PRAVACHOL 40 MG TABS (PRAVASTATIN SODIUM) one p.o. q. day  #30 Each x 2   Entered by:   Army Fossa CMA   Authorized by:   Nolon Rod. Paz MD   Signed by:   Army Fossa CMA on 02/10/2010   Method used:   Electronically to        [facility] #[id number]* (retail)       [address]       [place], [place]  [address]       Ph: [phone]       Fax: [phone]   RxID:   [id number]

## 2010-04-03 LAB — BASIC METABOLIC PANEL
BUN: 26 mg/dL — ABNORMAL HIGH (ref 6–23)
Chloride: 106 mEq/L (ref 96–112)
GFR calc Af Amer: 45 mL/min — ABNORMAL LOW (ref >60)
GFR calc non Af Amer: 37 mL/min — ABNORMAL LOW (ref >60)
Potassium: 3.5 mEq/L (ref 3.5–5.1)
Sodium: 134 mEq/L — ABNORMAL LOW (ref 135–145)

## 2010-04-03 LAB — URINE MICROSCOPIC-ADD ON

## 2010-04-03 LAB — DIFFERENTIAL
Eosinophils Absolute: 0 10*3/uL (ref 0.0–0.7)
Eosinophils Relative: 0 % (ref 0–5)
Lymphocytes Relative: 13 % (ref 12–46)
Lymphs Abs: 1.6 10*3/uL (ref 0.7–4.0)
Monocytes Relative: 8 % (ref 3–12)

## 2010-04-03 LAB — URINALYSIS, ROUTINE W REFLEX MICROSCOPIC
Nitrite: NEGATIVE
Specific Gravity, Urine: 1.019 (ref 1.005–1.030)
Urobilinogen, UA: 1 mg/dL (ref 0.0–1.0)

## 2010-04-03 LAB — URINE CULTURE
Colony Count: NO GROWTH
Culture: NO GROWTH

## 2010-04-03 LAB — CBC
HCT: 38.5 % — ABNORMAL LOW (ref 39.0–52.0)
Hemoglobin: 13.5 g/dL (ref 13.0–17.0)
MCV: 100.2 fL — ABNORMAL HIGH (ref 78.0–100.0)
Platelets: 232 10*3/uL (ref 150–400)
RBC: 3.85 MIL/uL — ABNORMAL LOW (ref 4.22–5.81)
WBC: 12.4 10*3/uL — ABNORMAL HIGH (ref 4.0–10.5)

## 2010-04-16 LAB — URINALYSIS, ROUTINE W REFLEX MICROSCOPIC
Ketones, ur: NEGATIVE mg/dL
Nitrite: NEGATIVE
Protein, ur: NEGATIVE mg/dL
Urobilinogen, UA: 0.2 mg/dL (ref 0.0–1.0)

## 2010-04-16 LAB — CBC
Hemoglobin: 11.8 g/dL — ABNORMAL LOW (ref 13.0–17.0)
MCHC: 34.5 g/dL (ref 30.0–36.0)
MCHC: 34.5 g/dL (ref 30.0–36.0)
MCHC: 34.6 g/dL (ref 30.0–36.0)
MCV: 102.6 fL — ABNORMAL HIGH (ref 78.0–100.0)
MCV: 100.9 fL — ABNORMAL HIGH (ref 78.0–100.0)
MCV: 101.9 fL — ABNORMAL HIGH (ref 78.0–100.0)
Platelets: 246 10*3/uL (ref 150–400)
Platelets: 277 10*3/uL (ref 150–400)
Platelets: 167 10*3/uL (ref 150–400)
RBC: 3.42 MIL/uL — ABNORMAL LOW (ref 4.22–5.81)
RBC: 4.02 MIL/uL — ABNORMAL LOW (ref 4.22–5.81)
RBC: 3.37 MIL/uL — ABNORMAL LOW (ref 4.22–5.81)
RBC: 4.16 MIL/uL — ABNORMAL LOW (ref 4.22–5.81)
RDW: 13.8 % (ref 11.5–15.5)
WBC: 10.9 10*3/uL — ABNORMAL HIGH (ref 4.0–10.5)
WBC: 9.4 10*3/uL (ref 4.0–10.5)
WBC: 10.6 10*3/uL — ABNORMAL HIGH (ref 4.0–10.5)
WBC: 7.3 10*3/uL (ref 4.0–10.5)

## 2010-04-16 LAB — BASIC METABOLIC PANEL
BUN: 9 mg/dL (ref 6–23)
BUN: 17 mg/dL (ref 6–23)
CO2: 21 mEq/L (ref 19–32)
Calcium: 8.1 mg/dL — ABNORMAL LOW (ref 8.4–10.5)
Calcium: 8.5 mg/dL (ref 8.4–10.5)
Calcium: 8.7 mg/dL (ref 8.4–10.5)
Chloride: 105 mEq/L (ref 96–112)
Creatinine, Ser: 1.01 mg/dL (ref 0.4–1.5)
Creatinine, Ser: 3.26 mg/dL — ABNORMAL HIGH (ref 0.4–1.5)
GFR calc Af Amer: >60 mL/min (ref >60)
GFR calc Af Amer: 23 mL/min — ABNORMAL LOW (ref >60)
GFR calc Af Amer: >60 mL/min (ref >60)
GFR calc non Af Amer: >60 mL/min (ref >60)
GFR calc non Af Amer: 19 mL/min — ABNORMAL LOW (ref >60)
GFR calc non Af Amer: 29 mL/min — ABNORMAL LOW (ref >60)
GFR calc non Af Amer: >60 mL/min (ref >60)
Glucose, Bld: 114 mg/dL — ABNORMAL HIGH (ref 70–99)
Potassium: 4 mEq/L (ref 3.5–5.1)
Potassium: 3.9 mEq/L (ref 3.5–5.1)
Sodium: 135 mEq/L (ref 135–145)
Sodium: 135 mEq/L (ref 135–145)
Sodium: 138 mEq/L (ref 135–145)

## 2010-04-16 LAB — APTT
aPTT: 26 seconds (ref 24–37)
aPTT: 25 seconds (ref 24–37)

## 2010-04-16 LAB — RAPID URINE DRUG SCREEN, HOSP PERFORMED
Amphetamines: NOT DETECTED
Benzodiazepines: NOT DETECTED
Tetrahydrocannabinol: NOT DETECTED

## 2010-04-16 LAB — DIFFERENTIAL
Basophils Absolute: 0 10*3/uL (ref 0.0–0.1)
Basophils Relative: 0 % (ref 0–1)
Eosinophils Absolute: 0 10*3/uL (ref 0.0–0.7)
Monocytes Relative: 5 % (ref 3–12)
Neutrophils Relative %: 77 % (ref 43–77)

## 2010-04-16 LAB — COMPREHENSIVE METABOLIC PANEL
ALT: 17 U/L (ref 0–53)
AST: 20 U/L (ref 0–37)
AST: 18 U/L (ref 0–37)
Albumin: 3.5 g/dL (ref 3.5–5.2)
Albumin: 2.9 g/dL — ABNORMAL LOW (ref 3.5–5.2)
CO2: 25 mEq/L (ref 19–32)
Calcium: 8.5 mg/dL (ref 8.4–10.5)
Chloride: 106 mEq/L (ref 96–112)
Creatinine, Ser: 1.39 mg/dL (ref 0.4–1.5)
GFR calc Af Amer: >60 mL/min (ref >60)
GFR calc Af Amer: >60 mL/min (ref >60)
GFR calc non Af Amer: >60 mL/min (ref >60)
Potassium: 4.7 mEq/L (ref 3.5–5.1)
Sodium: 138 mEq/L (ref 135–145)
Total Bilirubin: 0.7 mg/dL (ref 0.3–1.2)
Total Protein: 5.6 g/dL — ABNORMAL LOW (ref 6.0–8.3)

## 2010-04-16 LAB — TYPE AND SCREEN

## 2010-04-16 LAB — URINE MICROSCOPIC-ADD ON

## 2010-04-16 LAB — CARDIAC PANEL(CRET KIN+CKTOT+MB+TROPI)
CK, MB: 1.3 ng/mL (ref 0.3–4.0)
Relative Index: 1.2 (ref 0.0–2.5)
Total CK: 107 U/L (ref 7–232)
Troponin I: 0.01 ng/mL (ref 0.00–0.06)

## 2010-04-16 LAB — TSH: TSH: 2.05 u[IU]/mL (ref 0.350–4.500)

## 2010-04-16 LAB — CREATININE, URINE, RANDOM: Creatinine, Urine: 112.6 mg/dL

## 2010-04-16 LAB — PROTIME-INR: Prothrombin Time: 13.9 seconds (ref 11.6–15.2)

## 2010-04-16 LAB — ETHANOL: Alcohol, Ethyl (B): 202 mg/dL — ABNORMAL HIGH (ref 0–10)

## 2010-04-16 LAB — VITAMIN B12: Vitamin B-12: 457 pg/mL (ref 211–911)

## 2010-04-16 LAB — SODIUM, URINE, RANDOM: Sodium, Ur: 34 mEq/L

## 2010-04-16 LAB — ABO/RH: ABO/RH(D): B POS

## 2010-05-21 ENCOUNTER — Other Ambulatory Visit: Payer: Self-pay | Admitting: Internal Medicine

## 2010-05-22 ENCOUNTER — Other Ambulatory Visit: Payer: Self-pay | Admitting: Internal Medicine

## 2010-05-23 ENCOUNTER — Encounter: Payer: Self-pay | Admitting: Internal Medicine

## 2010-05-26 ENCOUNTER — Ambulatory Visit (INDEPENDENT_AMBULATORY_CARE_PROVIDER_SITE_OTHER): Payer: Medicare PPO | Admitting: Internal Medicine

## 2010-05-26 ENCOUNTER — Encounter: Payer: Self-pay | Admitting: Internal Medicine

## 2010-05-26 DIAGNOSIS — I10 Essential (primary) hypertension: Secondary | ICD-10-CM

## 2010-05-26 DIAGNOSIS — Z0000 Encounter for general adult medical examination without abnormal findings: Secondary | ICD-10-CM | POA: Insufficient documentation

## 2010-05-26 DIAGNOSIS — K2270 Barrett's esophagus without dysplasia: Secondary | ICD-10-CM

## 2010-05-26 DIAGNOSIS — M1990 Unspecified osteoarthritis, unspecified site: Secondary | ICD-10-CM

## 2010-05-26 DIAGNOSIS — E785 Hyperlipidemia, unspecified: Secondary | ICD-10-CM

## 2010-05-26 LAB — LIPID PANEL
Cholesterol: 133 mg/dL (ref 0–200)
HDL: 42.4 mg/dL (ref >39.00)
Total CHOL/HDL Ratio: 3
Triglycerides: 90 mg/dL (ref 0.0–149.0)

## 2010-05-26 LAB — CBC WITH DIFFERENTIAL/PLATELET
Eosinophils Relative: 0.7 % (ref 0.0–5.0)
HCT: 43.8 % (ref 39.0–52.0)
Lymphs Abs: 2 10*3/uL (ref 0.7–4.0)
Monocytes Relative: 7.6 % (ref 3.0–12.0)
Platelets: 200 10*3/uL (ref 150.0–400.0)
WBC: 11.9 10*3/uL — ABNORMAL HIGH (ref 4.5–10.5)

## 2010-05-26 LAB — ALT: ALT: 19 U/L (ref 0–53)

## 2010-05-26 LAB — AST: AST: 20 U/L (ref 0–37)

## 2010-05-26 NOTE — Progress Notes (Signed)
  Subjective:    Patient ID: [name], male    DOB: [birth year], 70 y.o.   MRN: [id number]  HPI Routine office visit, no complaints Past Medical History  Diagnosis Date  . Carotid arterial disease     s/p surgery 11/2008  . Hypertension   . Colonic polyp     cscopes 08/18/05 tics,polyps.Marland Kitchenall hyperplastic  . GERD (gastroesophageal reflux disease)     Barrets Esop. (per bx 08/2005) and HH f/u Dr.Schooler  . Anal warts   . Anal fissure   . Hemorrhoids   . 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 Panic attack   . Osteoarthritis   . Depression    Past Surgical History  Procedure Date  . Right carotid endartectomy with dacron patch angioplasty 11/2008  . Appendectomy   . Knee surgery   . Foot surgery     fx  . Total hip arthroplasty 2005    R due to OA  . Skin tags excised   . Transurethral resection of prostate   . Cataract extraction     wears contact lens L, good vision    Review of Systems Still smoking. He has a history of Barrett's sophagus, denies dysphasia or odynophagia. As far as his OA, he continue with hip pain, he was evaluated in [place]  for a second opinion in regards to pain, they have not been able to "figure that out"    Objective:   Physical Exam Alert oriented in no apparent distress. Lungs clear to auscultation bilaterally. Cardiovascular regular and rhythm without murmur.        Assessment & Plan:

## 2010-05-26 NOTE — Assessment & Plan Note (Addendum)
Chronic hip pain, was referred to [facility] for another opinion, still hurts "they still having a hard time figuring out why I hurt"

## 2010-05-26 NOTE — Assessment & Plan Note (Signed)
He was due  for a EGD 2010 , he is aware that is needed for cancer surveillance, states does not have time to do that at this point

## 2010-05-26 NOTE — Assessment & Plan Note (Signed)
Due for labs

## 2010-05-26 NOTE — Assessment & Plan Note (Signed)
Good BP, no change

## 2010-05-26 NOTE — Assessment & Plan Note (Signed)
Aware he is due for a cscope, states can't do that at this point due to his mother's health

## 2010-05-27 NOTE — Assessment & Plan Note (Signed)
OFFICE VISIT   CAMBRIDGE, [name]  DOB:  December 07, 1940                                       07/09/2009  ZOXWR#:[id number]   DATE OF SURGERY:  11/27/2008, consisting of a right carotid  endarterectomy with Dacron patch angioplasty closure.   Mr. [name] returns to the office for a 68-month follow-up.  At this  time, he states that he has been doing very well.  His hypertension and  skin cancers remain stable.  He is currently smoking.  He states that he  is having some numbness and tingling in his right jaw above the incision  line.   Physical findings revealed a very pleasant elderly man who walks with a  cane due to hip replacement.  Heart rate was 75, blood pressure 109/68  in the right arm, 118/72 in the left arm, O2 saturation was 98% on room  air.  He was well-nourished and in no apparent distress.  HEENT:  PERRLA, EOMI with normal conjunctiva.  Cardiac exam revealed a regular  rate and rhythm.  Abdomen was soft, nontender, nondistended.  Lungs were  clear to auscultation.  I appreciated no carotid bruits.  He did have a  very well-healed neck incision.   IMPRESSION/PLAN:  Mr. [name] continues to do very well following right  carotid endarterectomy.  We will continue to follow him on a 12-month  basis in order to provide surveillance for his carotid occlusive  disease.  He was given an appointment in 6 months with carotid Dopplers  at that time.   Wilmon Arms, PA   Janetta Hora. Fields, MD  Electronically Signed   KEL/MEDQ  D:  07/09/2009  T:  07/09/2009  Job:  [id number]

## 2010-05-27 NOTE — Procedures (Signed)
OPERATIVE REPORT   [name], [name]  DOB:  September 13, 1940                                       11/27/2008  EAVWU#:[id number]   PREOPERATIVE DIAGNOSIS:  Greater than 80% right carotid stenosis.   POSTOPERATIVE DIAGNOSIS:  Greater than 80% right carotid stenosis.   PROCEDURE:  Right carotid endarterectomy with Dacron patch angioplasty.   INDICATIONS:  This is a 70 year old gentleman who had been found  unresponsive at home.  Part of his workup included a carotid duplex scan  which showed a greater than 80% right carotid stenosis with no  significant stenosis on the left.  Although he was asymptomatic from a  carotid standpoint, right carotid endarterectomy was recommended in  order to lower his risk of future stroke.   TECHNIQUE:  The patient was taken to the operating room and received a  general anesthetic.  An arterial line had been placed by anesthesia.  The right neck was prepped and draped in the usual sterile fashion.  An  incision was made along the anterior border of the sternocleidomastoid.  The dissection was carried down to the common carotid artery which was  dissected free and controlled with a Rumel tourniquet.  The facial vein  was divided between 2-0 silk ties and the internal carotid artery was  controlled above the plaque.  Of note, the bifurcation was somewhat high  and I had to fully mobile the hypoglossal nerve and retract on the  digastric muscle.  I was able to get above the plaque which extended  fairly high.  The external carotid artery was controlled with a blue  vessel loop.  The patient was heparinized.  Clamps were then placed on  the internal, then the common, then the external carotid artery.  A  longitudinal arteriotomy was made in the common carotid artery, and this  was extended to the plaque into the internal carotid artery.  A 10 shunt  was placed into the internal carotid artery, backbled and then placed in  the common  carotid artery and secured with a Rumel tourniquet.  Flow was  reestablished to the shunt.  An endarterectomy plane was established  proximally and the plaque was sharply divided.  An eversion  endarterectomy was performed in the external carotid artery.  Distally,  there was a nice taper in the plaque and no tacking sutures were  required.  The artery was irrigated with copious amounts of heparin and  dextran and all loose debris removed.  The Dacron patch was then sewn  using continuous 6-0 Prolene suture.  Prior to completing the patch  closure, the artery was backbled and flushed appropriately.  The  anastomosis was completed.  Flow was reestablished first to the external  carotid artery and into the internal carotid artery.  At the completion,  there was a good pulse distal to the patch and a good Doppler signal.  Hemostasis was obtained in the wound.  The wound was closed with a deep  layer of 3-0 Vicryl.  The platysma was closed with running 3-0 Vicryl.  The skin was closed with a 4-0 subcuticular stitch.  A sterile dressing  was applied.  The patient tolerated the procedure well and was  transferred to the recovery room in stable condition.  All needle and  sponge counts were correct.   [name]. [name], M.D.  Electronically Signed   CSD/MEDQ  D:  11/27/2008  T:  11/27/2008  Job:  [id number]   cc:   Ashley Royalty, M.D.  Ermelinda Das, MD, [facility]

## 2010-05-27 NOTE — Procedures (Signed)
CAROTID DUPLEX EXAM   INDICATION:  Follow up known carotid artery disease.   HISTORY:  Diabetes:  No.  Cardiac:  No.  Hypertension:  Yes.  Smoking:  Yes.  Previous Surgery:  Right carotid endarterectomy.  CV History:  No.  Amaurosis Fugax No, Paresthesias No, Hemiparesis No.                                       RIGHT             LEFT  Brachial systolic pressure:         140               150  Brachial Doppler waveforms:         WNL               WNL  Vertebral direction of flow:        Antegrade         Antegrade  DUPLEX VELOCITIES (cm/sec)  CCA peak systolic                   84                97  ECA peak systolic                   80                89  ICA peak systolic                   64                96  ICA end diastolic                   26                26  PLAQUE MORPHOLOGY:                                    Heterogenous  PLAQUE AMOUNT:                                        Mild  PLAQUE LOCATION:                                      CCA, ICA   IMPRESSION:  1. Patent right carotid endarterectomy site with no evidence of      restenosis.  2. Left internal carotid artery suggests 20% to 39% stenosis.  3. Antegrade flow in bilateral vertebrals.   ___________________________________________  Di Kindle. Edilia Bo, M.D.   CB/MEDQ  D:  07/09/2009  T:  07/10/2009  Job:  [id number]

## 2010-05-27 NOTE — Procedures (Signed)
CAROTID DUPLEX EXAM   INDICATION:  Follow up right CEA.   HISTORY:  Diabetes:  No  Cardiac:  No  Hypertension:  Yes  Smoking:  Yes  Previous Surgery:  Right CEA 11/27/2008  Amaurosis Fugax No, Paresthesias No, Hemiparesis No                                       RIGHT             LEFT  Brachial systolic pressure:         174               174  Brachial Doppler waveforms:         WNL               WNL  Vertebral direction of flow:        antegrade         antegrade  DUPLEX VELOCITIES (cm/sec)  CCA peak systolic                   73                69  ECA peak systolic                   85                109  ICA peak systolic                   68                86  ICA end diastolic                   22                28  PLAQUE MORPHOLOGY:                  heterogeneous     heterogeneous  PLAQUE AMOUNT:                      minimal           mild  PLAQUE LOCATION:                    CCA               CCA/ICA   IMPRESSION:  1. Widely patent right CEA.  2. Left internal carotid artery plaquing 1% to 39%.  3. Antegrade vertebral arteries bilaterally.  4. Stable findings compared to 07/09/2009.   ___________________________________________  Di Kindle. Edilia Bo, M.D.   LT/MEDQ  D:  01/09/2010  T:  01/09/2010  Job:  [id number]

## 2010-05-27 NOTE — Assessment & Plan Note (Signed)
OFFICE VISIT   ABDALLAH, HERN  DOB:  03/21/1940                                       12/20/2008  EAVWU#:[id number]   I saw the patient in the office today for followup after his recent  right carotid endarterectomy.  This is a pleasant 70 year old gentleman  who had been found unresponsive at home.  As part of his workup he had a  carotid duplex scan done at [facility] which showed a greater than 80%  right carotid stenosis with no significant stenosis on the left.  Although the stenosis on the right I felt was asymptomatic right carotid  endarterectomy was recommended in order to lower his risk of future  stroke given the severity of the stenosis.   He underwent an uneventful right carotid endarterectomy with Dacron  patch angioplasty on 11/27/2008.  He did well postoperatively and he was  discharged on 11/29/2008.  He returns for his first outpatient visit.  Since discharge he has had no focal weakness or paresthesias.  He does  note he is still having occasional episodes of confusion which he had  been having preoperatively.   PHYSICAL EXAMINATION:  Blood pressure is 165/97, heart rate is 85.  His  right neck incision is healing nicely.  He has no focal weakness or  paresthesias.   Overall I am pleased with his progress and we will see him back in 6  months for a followup carotid duplex scan.  He knows to call sooner if  he has problems.  In the meantime he knows to continue taking his  aspirin.   Di Kindle. Edilia Bo, M.D.  Electronically Signed   CSD/MEDQ  D:  12/20/2008  T:  12/21/2008  Job:  [id number]   cc:   Lidia Collum, MD  Rollene Rotunda, MD, [facility]  Dr Richardo Priest, MD

## 2010-05-27 NOTE — Assessment & Plan Note (Signed)
[facility]                            CARDIOLOGY OFFICE NOTE   [name], [name]                   MRN:          [id number]  DATE:10/04/2006                            DOB:          Jun 11, 1940    PRIMARY CARE PHYSICIAN:  Leanne Chang, M.D.   REASON FOR CONSULTATION:  Evaluate patient with difficult-to-control  hypertension.   HISTORY OF PRESENT ILLNESS:  Patient is 71 years old.  He has no prior  cardiac history.  He has a history of hypertension, although it has been  only mild in the past and not difficult to control; however, recently,  he was not feeling well.  He was at his friend's house and took his  blood pressure and said it was 225/170.  He subsequently presented to  Dr. Blossom Hoops, perhaps a day later, when he was noted to have systolic  blood pressures in the 170s.  He was having some trouble with  hydrochlorothiazide, so this was discontinued.  He was started on Diovan  80 mg; however, this had to be increased to 160 mg two weeks ago.  He  has been taking his blood pressure at home, and it typically is in the  150s/80s.  He denies any other symptoms.  He has not been having any  chest discomfort, neck discomfort, arm discomfort, activity-induced  nausea and vomiting, excessive diaphoresis.  He has not been having  palpitations, presyncope, or syncope.  He has not been having any PND or  orthopnea.  He does snore, although he has not been witnessed to have  any apneic events.  He is not describing headaches or daytime  somnolence.  He is concerned that this was a rather abrupt change in his  blood pressure, which had been well controlled over the years.   Of note, the patient has a significant amount of anxiety.  He has been  unable to find a job.  He is going through a divorce.  He is caring for  his aged mother.   PAST MEDICAL HISTORY:  1. Hypertension since 1993.  2. Anxiety/panic.   PAST SURGICAL HISTORY:  1. Hip  replacement.  2. Knee cartilage surgery.  3. Appendectomy.  4. Tonsillectomy.   ALLERGIES:  None.   MEDICATIONS:  1. Lorazepam.  2. Pantoprazole 40 mg daily.  3. Buspirone 7.5 mg b.i.d.  4. Diovan 160 mg daily.  5. Doxazosin 4 mg daily.  6. Vitamin E.  7. Multivitamins.  8. Aspirin.   SOCIAL HISTORY:  Patient is retired.  He is separated.  He has two  children.  He has been smoking less than one pack per day for 46 years.  He occasionally drinks alcohol.   FAMILY HISTORY:  Noncontributory for early coronary artery disease.  He  has no brothers and sisters.  His father died at 61 of lung cancer.  His  mother is still alive at 10.   REVIEW OF SYSTEMS:  As stated in the HPI.  Problems with difficulty  urinating and urinary urgency with hydrochlorothiazide.  Negative for  other systems.   PHYSICAL EXAMINATION:  Patient is in  no distress.  Blood pressure 144/82, heart rate 87 and regular, weight 202 pounds.  Body Mass Index 26.  HEENT:  Eyelids unremarkable.  Pupils are equal, round and reactive to  light.  Fundi not visualized.  Oral mucosa unremarkable.  NECK:  No jugular venous distention.  Waveform within normal limits.  Carotid upstroke brisk and symmetric.  No bruits, no thyromegaly.  LYMPHATICS:  No cervical, axillary, or inguinal adenopathy.  LUNGS:  Clear to auscultation bilaterally.  BACK:  No costovertebral angle tenderness.  CHEST:  Unremarkable.  HEART:  PMI not displaced or sustained.  S1 and S2 within normal limits.  No S3, no S4, no clicks, rubs, or murmurs.  ABDOMEN:  Obese, positive bowel sounds.  Normal in frequency and pitch.  No bruits, rebound, guarding.  There are no midline pulsatile masses.  No hepatomegaly, no splenomegaly.  SKIN:  No rashes, no nodules.  EXTREMITIES:  Pulses 2+ throughout.  No cyanosis, no clubbing, no edema.  NEUROLOGIC:  Oriented to person, place, and time.  Cranial nerves II-XII  grossly intact.  Motor grossly intact  throughout.   EKG:  Sinus rhythm, rate 87.  Axis within normal limits.  Intervals  within normal limits.  Early transition lead, V2.  No acute ST-T wave  changes.   ASSESSMENT/PLAN:  1. Hypertension:  The patient's presenting problem is hypertension.  I      am not clear as to what caused this to shoot up suddenly.  Now I am      worried that he has sleep apnea.  We discussed a sleep study.  He      would like to defer until he has his Medicare in May.  This would      be reasonable, as he is not in any acute situation.  I discussed      adding a low dose diuretic, and then found out he had not tolerated      hydrochlorothiazide.  At this point, I think it is reasonable to      try therapeutic lifestyle changes, if he can really lose weight.      He needs to avoid salt and have limited alcohol consumption.  He      has just had his Diovan increased two weeks ago.  If he is able to      lose weight in the next month before his appointment with Dr.      Blossom Hoops and make the other lifestyle changes, and his blood      pressure is still elevated, then I would add a low dose beta      blocker.  He and I had a long discussion about this.  2. Sleep apnea:  I do believe the patient might have sleep apnea and      should have a sleep study in the spring.  3. Obesity:  We did discuss the [facility] Diet as a way to lose      weight with diet and exercise.  4. He understands the need to stop smoking.  We discussed Chantix.  He      does not want this.  We did discuss the patches, and he had a bad      reaction with these in the past.  He might tolerate the nicotine      gum or Commit lozenges.  He may try cold [place].  5. Followup:  I have encouraged him to follow up with Dr. Blossom Hoops,  and he can come back to this clinic as needed.     Rollene Rotunda, MD, [facility]  Electronically Signed    JH/MedQ  DD: 10/04/2006  DT: 10/05/2006  Job #: [id number]   cc:   Leanne Chang, M.D.

## 2010-05-30 NOTE — Op Note (Signed)
[facility]  Patient:    [name], [name]                   MRN: [id number] Proc. Date: 04/30/00 Adm. Date:  [id number] Attending:  Charlton Haws CC:         Redmond Baseman, M.D.  Darci Needle, M.D.   Operative Report  CCS#:  [id number]  PREOPERATIVE DIAGNOSES: 1. Anal condyloma. 2. Hemorrhoids.  POSTOPERATIVE DIAGNOSES: 1. Anal condyloma. 2. Hemorrhoids.  OPERATION PERFORMED:  Laser therapy for anal condyloma and rubber band ligation of two right sided internal hemorrhoids.  SURGEON:  Dr. Jamey Ripa.  ANESTHESIA:  General.  CLINICAL HISTORY:  This patient is a 70 year old who first saw me for both hemorrhoids and some anal condyloma. We were able to reduce his hemorrhoids symptoms somewhat with rubber band but he has been unsuccessful in treating his anal condyloma with multiple applications of podophyllin. After a lengthy discussion with the patient, we elected to go ahead to laser these under anesthesia with a plan to see if we needed to do any other limited hemorrhoidectomy. Most of the symptoms were related to the perianal condyloma.  DESCRIPTION OF PROCEDURE:  The patient was brought to the operating room and after satisfactory general anesthesia had been obtained was placed in lithotomy position. The perianal area was prepped and draped. Most of the condyloma were small. There was a small cluster on the right side at about the mid lateral position and with a laser set at 10 watts and intermittent these were vaporized. We got down to just below the dermis. There is a second small collection on the left side just anterior to the midline and a third set just posterior to the midline on the left side and these were treated likewise. There were no other gross evidence externally of any condyloma at this point once these had been treated.  Externally, he did have some skin tags. Anoscopic exam was then performed and he  had very minimal hemorrhoid disease on the left side but two medium sized columns internally on the right side. Both of these were rubber banded nicely and I felt this would give him significant improvement in his symptoms without the necessity of a full hemorrhoidectomy. There is no evidence internally of any condylomatous disease.  Once this was done, I infiltrated the area with 0.5% Marcaine with epinephrine for postop analgesia. I then took the laser back and with it defocused (painted) the entire perianal area to try to sterilize this from any other condylomatous lesions that might be in the development phase or any viral elements that might have been residual on the skin.  The patient tolerated the procedure well. There was no significant blood loss. DD:  04/30/00 TD:  04/30/00 Job: [id number]

## 2010-05-30 NOTE — Op Note (Signed)
NAME:  DARAY, POLGAR NO.:  [id number]   MEDICAL RECORD NO.:  [id number]          PATIENT TYPE:  AMB   LOCATION:  ENDO                         FACILITY:  [facility]   PHYSICIAN:  Shirley Friar, MDDATE OF BIRTH:  06-21-1940   DATE OF PROCEDURE:  08/18/2005  DATE OF DISCHARGE:                                 OPERATIVE REPORT   PROCEDURE:  Upper endoscopy dictation.   INDICATIONS:  Surveillance for Barrett's esophagus.   MEDICATIONS:  Fentanyl 100 mcg IV, Versed 10 mg IV, Phenergan 12.5 mg IV.   FINDINGS:  Endoscope was inserted into the oropharynx and esophagus was  intubated.  The mid and proximal portion of the esophagus was normal in  appearance.  In the distal portion of the esophagus was one area of salmon-  colored mucosa likely due to Barrett's esophagus that was approximately 3 cm  in length.  Multiple biopsies were taken of this area.  No other  abnormalities were noted in the distal esophagus.  Endoscope was advanced  down to the stomach which was normal in its entirety.  Endoscope was  advanced down to the duodenal bulb and second portion of duodenum which were  both normal.   ASSESSMENT:  Short segment Barrett's esophagus, status post biopsy, rule out  dysplasia, otherwise normal esophagogastroduodenoscopy.   PLAN:  1.  Follow-up on path.  2.  Repeat EGD in 2-3 years if Barrett's again present.  3.  Continue antireflux regimen indefinitely.      Shirley Friar, MD  Electronically Signed     VCS/MEDQ  D:  08/18/2005  T:  08/18/2005  Job:  [id number]

## 2010-05-30 NOTE — Discharge Summary (Signed)
NAME:  SHAHMEER, BUNN NO.:  [id number]   MEDICAL RECORD NO.:  [id number]          PATIENT TYPE:  INP   LOCATION:  5014                         FACILITY:  [facility]   PHYSICIAN:  Nadara Mustard, MD     DATE OF BIRTH:  Sep 05, 1940   DATE OF ADMISSION:  10/30/2003  DATE OF DISCHARGE:  11/04/2003                                 DISCHARGE SUMMARY   DIAGNOSIS:  Osteoarthritis, right hip.   PROCEDURE:  Right total hip arthroplasty.   DISPOSITION:  Discharged to home in stable condition with home health  physical therapy.   Follow up in the office in two weeks.   DISCHARGE MEDICATIONS:  Vicodin for pain and Coumadin for deep venous  thrombosis prophylaxis.   HISTORY OF PRESENT ILLNESS:  The patient is a 70 year old gentleman with  osteoarthritis of the right hip.  He has failed conservative care and  presents at this time for total hip arthroplasty.  The patient's hospital  course was essentially unremarkable.  He underwent a right total hip  arthroplasty on October 30, 2003 with an Accolate #2 femoral stem, 58 mm  acetabulum, 36 mm head with a +0 neck. He received Kefzol for infection  prophylaxis and Coumadin for DVT prophylaxis.  The patient's postoperative  course was essentially unremarkable.  His IV and PCA were discontinued on  November 01, 2003.  He progressed well with physical therapy and was  discharged to home in stable condition on November 04, 2003 with follow up in  the office in two weeks.      Vernia Buff   MVD/MEDQ  D:  12/25/2003  T:  12/25/2003  Job:  [id number]

## 2010-05-30 NOTE — Assessment & Plan Note (Signed)
[facility]                          [place] OFFICE NOTE   MARSH, HECKLER                   MRN:          [id number]  DATE:10/13/2005                            DOB:          1940/11/11    REASON FOR VISIT:  Establish care.   [name] is a 70 year old male presenting today to establish care. The  patient reports that he has a significant history of anxiety. In the last  year his anxiety has increased a lot. He reports that his wife has left  him. His mother who needs medical care has moved in with him, and he was  forced to retire. He is having a difficult time dealing with these changes.  He states that he wants to stop smoking, but at this point it is very  unlikely. Mr. Mcdiarmid reveals that when he gets stress, he starts to  scratch his right hand because it becomes pruritic. It has become a habit  that is difficult to break.   PAST MEDICAL HISTORY:  1. Hypertension.  2. Panic attacks.  3. Hiatal hernia with gastroesophageal reflux followed by Dr. Bosie Clos.  4. BPH followed by Dr. Etta Grandchild.  5. Barrett's esophagitis.  6. Anal warts.  7. Hemorrhoids.  8. Anal fissures.  9. 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   PAST SURGICAL HISTORY:  1. Fracture of right hip, fracture of right foot.  2. Appendectomy.  3. Knee surgery.   MEDICATIONS:  1. Lorazepam 0.5 mg b.i.d.  2. Protonix 50 mg daily.  3. Buspirone 7.5 mg in the morning and 50 mg p.m.  4. Hydrochlorothiazide 12.5 mg daily.  5. Cardura 2 mg q.h.s.  6. Vitamin E.  7. Centrum.  8. Aspirin 81 mg q.h.s.  9. Fish oil capsule 1000 mg q.p.m.  10.Garlic 500 mg daily.   ALLERGIES:  ZOLOFT causes tongue to bleed. NICOTINE PATCHES cause chest  pain. ZYBAN causes mild nightmares.   FAMILY HISTORY:  Mother alive with history of hypertension and arthritis. Is  currently living with him. Father died secondary to lung cancer.  He is the  only child.   SOCIAL HISTORY:  The patient separated. He has been married three times. He  has two children from his first marriage. He smokes one pack a day.   OBJECTIVE:  VITAL SIGNS:  Weight 197, pulse 72, initial blood pressure  152/82, recheck was 140/90.  GENERAL:  A pleasant male. He appears slightly anxious but no acute  distress.  NECK:  Supple, no lymphadenopathy, carotid bruits or JVD. No thyromegaly.  LUNGS:  Clear.  HEART:  Regular rate and rhythm. Normal S1, S2. No murmurs, gallops or rubs.  EXTREMITIES:  No cyanosis, clubbing or edema.  PSYCHIATRIC:  Patient appears a little anxious but answers questions  appropriately. Mood appears slightly down, but denies suicidal ideations.   IMPRESSION:  1. Hypertension borderline even though patient gives history of blood      pressure typically running in the 130s over 70s to 80s which was      confirmed by  my review of his previous records from  Dr. Modesto Charon.  1. Anxiety disorder.  2. Acute adjustment reaction.  3. Gastroesophageal reflux disease.   PLAN:  1. Supportive counseling was given to patient. I advised that I will refer      him to [facility] for further assistance. Patient      expressed willingness as long as it is not cost prohibitive.  2. Will check BMET and lipid profile.  3. Will have patient follow up with me in eight weeks to reassess his      blood pressure or sooner if needed.  4. The patient is scheduled to see Dr. Bosie Clos, his GI physician, to have      an endoscopy. He states that he had a colonoscopy two weeks ago which      was unremarkable per patient. He is also scheduled to see Dr. Etta Grandchild for      his general prostate check.            ______________________________  Leanne Chang, M.D.      LA/MedQ  DD:  10/13/2005  DT:  10/14/2005  Job #:  [id number]

## 2010-05-30 NOTE — Op Note (Signed)
NAME:  JERREL, TIBERIO NO.:  [id number]   MEDICAL RECORD NO.:  [id number]          PATIENT TYPE:  INP   LOCATION:  2899                         FACILITY:  [facility]   PHYSICIAN:  Nadara Mustard, M.D.   DATE OF BIRTH:  10-16-1940   DATE OF PROCEDURE:  10/30/2003  DATE OF DISCHARGE:                                 OPERATIVE REPORT   PREOPERATIVE DIAGNOSIS:  Osteoarthritis, right hip.   POSTOPERATIVE DIAGNOSIS:  Osteoarthritis, right hip.   OPERATION PERFORMED:  1.  Right total hip arthroplasty with [employer] ceramic components.  2.  Accolade femoral stem 58 mm acetabulum with 0 degree ceramic liner, 36      mm ceramic head with a +0 neck.   SURGEON:  Nadara Mustard, M.D.   ANESTHESIA:  General.   ESTIMATED BLOOD LOSS:  Minimal.   ANTIBIOTICS:  1g Kefzol.   DRAINS:  None.   COMPLICATIONS:  None.   DISPOSITION:  To PACU in stable condition.   INDICATIONS FOR PROCEDURE:  The patient is a 70 year old gentleman with  osteoarthritis of his right hip.  The patient had had pain with  weightbearing, pain with internal and external rotation of his right hip.  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  The patient states that he  wanted to proceed with his hip procedure prior to addressing his back.  The  patient has failed conservative care, has pains with activities of daily  living  and presents at this time for total hip arthroplasty. The risks and  benefits were discussed including infection, neurovascular injury,  persistent pain, sciatic nerve injury, leg length inequality, DVT, pulmonary  embolus.  The patient states that he understands and wishes to proceed at  this time.   DESCRIPTION OF PROCEDURE:  The patient was brought to the operating room 15  and underwent a general anesthetic.  After adequate level of anesthesia  obtained, the patient was placed in the left lateral decubitus  position with  the right side up and the right lower extremity was prepped using DuraPrep  and draped into a sterile field.  Collier Flowers was used to cover all exposed skin.  A posterolateral incision was made and the tensor fascia lata was split.  A  Charnley retractor was placed.  The sciatic nerve was visualized and  protected throughout the case.  The piriformis and short external rotators  were tagged, cut and retracted.  The quadratus was released.  The capsule  was T'd, tagged, cut and retracted.  The hip was dislocated and the neck cut  was made 1 cm proximal to the calcar.  Attention was first focused on the  acetabulum.  The acetabulum was sequentially reamed to 58 mm and the 58 mm  insert was placed.  The 0 degree poly liner was then placed within this.  Attention was then focused on the femur.  The femur was sequentially  broached to a #2 Accolade stem. The calcar planer was used to plane the  calcar and the +0 36 mm head  was inserted and the hip was reduced.  The  patient had a flexion of 120 degrees and was stable.  He had full adduction  with internal rotation of 70 degrees with no instability. He had full  extension, external rotation with no instability.  He had negative shuck  test.  The implants were then removed.  The centralizer spacer was placed in  the acetabulum and the 0 degree ceramic liner was placed.  The wound was  irrigated with normal saline through the case.  The Accolade stem was then  inserted with the +0 neck and the 36 mm ceramic head.  This was then reduced  without complications.  The hip was again placed through a full range of  motion with extension, external rotation, flexion to 120 degrees, full  adduction with internal rotation to 70 degrees and this was stable.  This  wound was again irrigated with normal saline.  The capsule was closed using  #1 Ethibond.  The short external rotators were reapproximated.  The tensor  fascia was closed using #1 Vicryl.   The subcutaneous was closed using 2-0  Vicryl.  The skin was closed using Proximate staples.  The wound was covered  with Adaptic orthopedic sponges, ABD dressing and Ioban drape.  The patient  was extubated and taken to PACU in stable condition.  A knee immobilizer was  placed.       MVD/MEDQ  D:  10/30/2003  T:  10/30/2003  Job:  [id number]

## 2010-05-30 NOTE — Op Note (Signed)
NAME:  [name], [name]            ACCOUNT NO.:  [id number]   MEDICAL RECORD NO.:  [id number]          PATIENT TYPE:  AMB   LOCATION:  ENDO                         FACILITY:  [facility]   PHYSICIAN:  Shirley Friar, MDDATE OF BIRTH:  [birth year]   DATE OF PROCEDURE:  08/18/2005  DATE OF DISCHARGE:                                 OPERATIVE REPORT   INDICATION:  History of polyps.  Colonoscopy in 2004, showed focal high-  grade glandular dysplasia from a pedunculated polyp that was removed at 30  cm.  This is a surveillance followup from that colonoscopy.   MEDICATIONS:  Fentanyl 25 mcg IV, Versed 2.5 mg IV, additional sedation  given preceding EGD (see EGD report).   FINDINGS:  Rectal exam showed small nonthrombosed external hemorrhoids.  A  pediatric adjustable colonoscope was inserted into an adequately prepped  colon and advanced to the cecum where the ileocecal valve and appendiceal  orifice were identified.  In order to reach the cecum, abdominal pressure  was necessary and the patient had to be turned onto his back due to a small  amount of looping.  On careful withdrawal of the colonoscope, required  frequent irrigation and aspiration of liquid stool.  There was scattered,  shallow, left-sided diverticulosis noted.  No polyps were seen on the right  side of the colon.  In the descending colon, there was a prominent fold that  was biopsied with cold biopsy.  In the distal descending colon and sigmoid  colon, there were scattered sub-centimeter polyps that were removed with hot  biopsy and one 5-mm polyp was removed with hot snare.  In the rectosigmoid,  there was a nodular fold that was hot biopsied.  Retroflexion was  unremarkable.   ASSESSMENT:  1.  Colon polyps, status post polypectomy as stated above.  2.  Left-sided diverticulosis.   PLAN:  1.  Follow up on path.  2.  No aspirin products x 14 days.  3.  High-fiber diet.  4.  Repeat colonoscopy in two to three years,  pending pathology.      Shirley Friar, MD  Electronically Signed     VCS/MEDQ  D:  08/18/2005  T:  08/18/2005  Job:  [id number]

## 2010-05-30 NOTE — Op Note (Signed)
NAME:  [name], [name] NO.:  [id number]   MEDICAL RECORD NO.:  [id number]          PATIENT TYPE:  AMB   LOCATION:  ENDO                         FACILITY:  [facility]   PHYSICIAN:  Shirley Friar, MDDATE OF BIRTH:  Nov 10, 1940   DATE OF PROCEDURE:  11/17/2005  DATE OF DISCHARGE:                                 OPERATIVE REPORT   PROCEDURE:  Upper endoscopy.   INDICATION:  Barrett's esophagus.   HISTORY:  70 year old white male who underwent surveillance for Barrett's  esophagus in August where he was found to have a short segment of salmon  colored mucosa. That biopsy showed Barrett's esophagus with glandular atypia  and definite for dysplasia.  He was put on double dose proton pump inhibitor  for eight weeks and instructed to come back for repeat endoscopy and biopsy  of this area.  He denies any further heartburn on the twice a day proton  pump inhibitor.   MEDICATIONS:  Fentanyl 100 mcg IV, Versed 10 mg IV, Phenergan 12.5 mg IV   FINDINGS:  The endoscope was inserted into the oropharynx and the esophagus  was intubated.  In the distal portion of the esophagus near the Z-line was  one finger of salmon colored mucosa that was biopsied multiple times.  This  was noted from 40 cm to 42 cm.  The remaining portion of the esophagus  appeared normal.  The endoscope was advanced down to the stomach which was  normal in its appearance and into the duodenum which was normal, as well.  The endoscope was withdrawn back into the stomach and retroflexion revealed  normal cardia, fundus and angularis.   ASSESSMENT:  Short segment of salmon colored mucosa seen in distal  esophagus, status post multiple biopsies to rule out dysplasia and  Barrett's.   PLAN:  1. If no dysplasia, repeat upper endoscopy in 2-3 years.  2. If dysplasia noted, then may need further therapy of this area.  3. Continue proton pump inhibitors twice a day.  4. Hold aspirin for the next seven  days.      Shirley Friar, MD  Electronically Signed     VCS/MEDQ  D:  11/17/2005  T:  11/17/2005  Job:  [id number]   cc:   Leanne Chang, M.D.

## 2010-06-01 ENCOUNTER — Other Ambulatory Visit: Payer: Self-pay | Admitting: Internal Medicine

## 2010-06-20 ENCOUNTER — Other Ambulatory Visit: Payer: Self-pay | Admitting: Internal Medicine

## 2010-08-18 ENCOUNTER — Other Ambulatory Visit: Payer: Self-pay | Admitting: Internal Medicine

## 2010-09-02 ENCOUNTER — Other Ambulatory Visit: Payer: Self-pay | Admitting: Internal Medicine

## 2010-09-04 NOTE — Telephone Encounter (Signed)
Last ov 5/14, last filled 01/20/10 #60 w/ 6

## 2010-09-04 NOTE — Telephone Encounter (Signed)
Ok 60 and 6 RF 

## 2010-09-04 NOTE — Telephone Encounter (Signed)
Rx Done w/5 refills.

## 2010-10-17 ENCOUNTER — Telehealth: Payer: Self-pay | Admitting: *Deleted

## 2010-10-17 MED ORDER — METOPROLOL TARTRATE 25 MG PO TABS: ORAL_TABLET | ORAL | Status: DC

## 2010-10-17 MED ORDER — PRAVASTATIN SODIUM 40 MG PO TABS: 40.0000 mg | ORAL_TABLET | Freq: Every day | ORAL | Status: DC

## 2010-10-17 MED ORDER — OMEPRAZOLE 40 MG PO CPDR: 40.0000 mg | DELAYED_RELEASE_CAPSULE | Freq: Every day | ORAL | Status: DC

## 2010-10-17 NOTE — Telephone Encounter (Signed)
RX[s] Done. Fax[facility] [phone]

## 2010-11-18 ENCOUNTER — Other Ambulatory Visit: Payer: Self-pay | Admitting: Internal Medicine

## 2010-11-26 ENCOUNTER — Ambulatory Visit (INDEPENDENT_AMBULATORY_CARE_PROVIDER_SITE_OTHER): Payer: Medicare PPO | Admitting: Internal Medicine

## 2010-11-26 ENCOUNTER — Encounter: Payer: Self-pay | Admitting: Internal Medicine

## 2010-11-26 DIAGNOSIS — I6529 Occlusion and stenosis of unspecified carotid artery: Secondary | ICD-10-CM

## 2010-11-26 DIAGNOSIS — F1010 Alcohol abuse, uncomplicated: Secondary | ICD-10-CM

## 2010-11-26 DIAGNOSIS — I10 Essential (primary) hypertension: Secondary | ICD-10-CM

## 2010-11-26 DIAGNOSIS — Z87898 Personal history of other specified conditions: Secondary | ICD-10-CM

## 2010-11-26 DIAGNOSIS — Z125 Encounter for screening for malignant neoplasm of prostate: Secondary | ICD-10-CM

## 2010-11-26 DIAGNOSIS — Z0000 Encounter for general adult medical examination without abnormal findings: Secondary | ICD-10-CM

## 2010-11-26 DIAGNOSIS — E785 Hyperlipidemia, unspecified: Secondary | ICD-10-CM

## 2010-11-26 DIAGNOSIS — K2270 Barrett's esophagus without dysplasia: Secondary | ICD-10-CM

## 2010-11-26 DIAGNOSIS — M1990 Unspecified osteoarthritis, unspecified site: Secondary | ICD-10-CM

## 2010-11-26 DIAGNOSIS — Z23 Encounter for immunization: Secondary | ICD-10-CM

## 2010-11-26 DIAGNOSIS — F528 Other sexual dysfunction not due to a substance or known physiological condition: Secondary | ICD-10-CM

## 2010-11-26 NOTE — Assessment & Plan Note (Signed)
Also request treatment for ED, viagra did not cause s/e but didn't work, cialis 20 mg sample provided, to take 1/2 or 1 prn, call for a Rx if needed

## 2010-11-26 NOTE — Assessment & Plan Note (Signed)
Again encouraged to get a EGD

## 2010-11-26 NOTE — Assessment & Plan Note (Addendum)
Per chart review, he had a procedure done by Dr. Etta Grandchild 2010 , pt  has no recollection of that Saw  Dr. Jeralyn Bennett 2011 but does not plan to see him again. He has some symptoms but requires no treatment.  Plan: labs

## 2010-11-26 NOTE — Progress Notes (Signed)
Subjective:    Patient ID: [name], male    DOB: Jul 10, 1940, 70 y.o.   MRN: [id number]  HPI Medicare Physical 1. Risk factors based on Past M, S, F history:reviewed 2. Physical Activities: not very active, minimal home chores  3. Depression/mood: chronic depression, no suicidal thoughts,not on  meds and continue to                      decline meds as before  4. Hearing: normal, no problems noted w/ normal conversation  5. ADL's: able to take care of himself so far, very limited cooking, no cleaning (has somebody              helping him) 6. Fall Risk: no recent falls , uses a cane and a walker , counseled   7. Home Safety: does feel safe at home 8. Height, weight, &visual acuity: see VS, wears a contact lens L, cataract surgery R with good results 9. Counseling: done  10. Labs ordered based on risk factors: if needed  11.           Referral Coordination, if needed 12.           Care Plan, see assessment and plan 13.            Cognitive Assessment, motor habilities  limited d/t OA, cognition seems intact  in addition we discussed the following DJD-- self discontinue hydrocodone, currently on Aleve only High cholesterol--good medication compliance Hypertension--good medication compliance, BP at home usually normal, slightly high today. GERD--good medication compliance, essentially asymptomatic   Past Medical History  Diagnosis Date  . Carotid arterial disease     s/p surgery 11/2008  . Hypertension   . Colonic polyp     cscopes 08/18/05 tics,polyps.Marland Kitchenall hyperplastic  . GERD (gastroesophageal reflux disease)     Barrets Esop. (per bx 08/2005) and HH f/u Dr.Schooler  . Anal warts   . Anal fissure   . Hemorrhoids   . 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 Panic attack   . Osteoarthritis   . Depression    Past Surgical History  Procedure Date  . Right carotid endartectomy with dacron patch angioplasty 11/2008  . Appendectomy   .  Knee surgery   . Foot surgery     fx  . Total hip arthroplasty 2005    R due to OA  . Skin tags excised   . Transurethral resection of prostate   . Cataract extraction     wears contact lens L, good vision   History   Social History  . Marital Status: Married    Spouse Name: N/A    Number of Children: N/A  . Years of Education: N/A   Occupational History  . Not on file.   Social History Main Topics  . Smoking status: Current Everyday Smoker -- 1.0 packs/day  . Smokeless tobacco: Never Used  . 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 Drug Use: No  . Sexually Active: Not on file   Other Topics Concern  . Not on file   Social History Narrative   Mother lives with him----     Family History  Problem Relation Age of Onset  . Lung cancer Father     mets to brain- deceased  . Hypertension Mother   . Arthritis Mother   . Coronary artery disease Neg  Hx   . Hypertension Mother   . Diabetes Paternal Aunt   . Stroke Paternal Grandfather   . Colon cancer Neg Hx   . Prostate cancer Neg Hx     Review of Systems No chest pain or shortness or breath The patient continued to smoke, and denies any sputum production or hemoptysis No nausea, vomiting, diarrhea No  blood in the stools No dysuria, occasional difficulty urinating, no hematuria.     Objective:   Physical Exam  Constitutional: He is oriented to person, place, and time. He appears well-developed and well-nourished. No distress.  HENT:  Head: Normocephalic and atraumatic.  Neck: No thyromegaly present.  Cardiovascular: Normal rate, regular rhythm and normal heart sounds.   No murmur heard. Pulmonary/Chest: Effort normal and breath sounds normal. No respiratory distress. He has no wheezes. He has no rales.  Abdominal: Bowel sounds are normal. He exhibits no distension. There is no tenderness. There is no rebound and no guarding.  Genitourinary: Rectum normal and prostate normal. Guaiac negative  stool.       External hemorrhoids noted  Musculoskeletal: He exhibits no edema.  Neurological: He is alert and oriented to person, place, and time.  Skin: He is not diaphoretic.  Psychiatric: He has a normal mood and affect. His behavior is normal. Judgment and thought content normal.      Assessment & Plan:

## 2010-11-26 NOTE — Assessment & Plan Note (Signed)
BP elevated today, see instructions

## 2010-11-26 NOTE — Assessment & Plan Note (Signed)
Currently on Aleve only.

## 2010-11-26 NOTE — Assessment & Plan Note (Signed)
Report is closely followed by Dr. Edilia Bo

## 2010-11-26 NOTE — Assessment & Plan Note (Signed)
At goal per last cholesterol panel

## 2010-11-26 NOTE — Assessment & Plan Note (Addendum)
Td ~ 2009 pneumonia shot < 5 years ago per patient , no documentation thus he agreed to have one today Had a flu shot 3 weeks ago Shingles shot discussed , info provided   Cscope  2007--- 3 hyperplastic polyps  Dr Bosie Clos , was due for a colonoscopy 2010 , not ready to have one done.  Again advise him to schedule a colonoscopy, explained the concept of early cancer detection. counseled about diet and exercise Counseled about tobacco

## 2010-11-26 NOTE — Patient Instructions (Addendum)
Call me or call Dr. Bosie Clos as soon as you are ready to  have a colonoscopy and a EGD. Come back in 6 months. Her blood pressure today was slightly elevated, please be sure you  Check the  blood pressure 2 or 3 times a week, be sure it is less than 140/85. If it is consistently higher, let me know

## 2010-11-26 NOTE — Assessment & Plan Note (Signed)
No drinking at present per patient and per his male friend who is here today

## 2010-11-28 LAB — CBC WITH DIFFERENTIAL/PLATELET
Basophils Relative: 0.2 % (ref 0.0–3.0)
Eosinophils Relative: 1.3 % (ref 0.0–5.0)
HCT: 44.6 % (ref 39.0–52.0)
Hemoglobin: 14.7 g/dL (ref 13.0–17.0)
Lymphs Abs: 1.6 10*3/uL (ref 0.7–4.0)
MCV: 100.4 fl — ABNORMAL HIGH (ref 78.0–100.0)
Monocytes Absolute: 0.5 10*3/uL (ref 0.1–1.0)
Neutro Abs: 8.3 10*3/uL — ABNORMAL HIGH (ref 1.4–7.7)
RBC: 4.44 Mil/uL (ref 4.22–5.81)
WBC: 10.5 10*3/uL (ref 4.5–10.5)

## 2010-11-28 LAB — BASIC METABOLIC PANEL
BUN: 17 mg/dL (ref 6–23)
Chloride: 109 mEq/L (ref 96–112)
Potassium: 4.7 mEq/L (ref 3.5–5.1)

## 2010-12-01 ENCOUNTER — Telehealth: Payer: Self-pay

## 2010-12-01 NOTE — Telephone Encounter (Signed)
Message copied by Beverely Low on Mon Dec 01, 2010  2:34 PM ------      Message from: [name]      Created: Mon Dec 01, 2010 10:03 AM       Advise patient      PSA has increased slightly faster than expected compared to 2 years ago, will repeat a TSH in 6 months       all other labs normal. Good results

## 2010-12-01 NOTE — Telephone Encounter (Signed)
Pt aware and copy of labs mailed to pt

## 2011-03-18 ENCOUNTER — Emergency Department (HOSPITAL_COMMUNITY): Payer: Medicare PPO

## 2011-03-18 ENCOUNTER — Inpatient Hospital Stay (HOSPITAL_COMMUNITY)
Admission: EM | Admit: 2011-03-18 | Discharge: 2011-04-01 | DRG: 234 | Disposition: A | Discharge status: Skilled Nursing Facility | Payer: Medicare PPO | Source: Ambulatory Visit | Attending: Thoracic Surgery (Cardiothoracic Vascular Surgery) | Admitting: Thoracic Surgery (Cardiothoracic Vascular Surgery)

## 2011-03-18 ENCOUNTER — Other Ambulatory Visit: Payer: Self-pay

## 2011-03-18 ENCOUNTER — Encounter (HOSPITAL_COMMUNITY): Payer: Self-pay | Admitting: *Deleted

## 2011-03-18 DIAGNOSIS — E785 Hyperlipidemia, unspecified: Secondary | ICD-10-CM | POA: Diagnosis present

## 2011-03-18 DIAGNOSIS — I2510 Atherosclerotic heart disease of native coronary artery without angina pectoris: Principal | ICD-10-CM | POA: Diagnosis present

## 2011-03-18 DIAGNOSIS — Z96649 Presence of unspecified artificial hip joint: Secondary | ICD-10-CM

## 2011-03-18 DIAGNOSIS — I200 Unstable angina: Secondary | ICD-10-CM | POA: Diagnosis present

## 2011-03-18 DIAGNOSIS — M1990 Unspecified osteoarthritis, unspecified site: Secondary | ICD-10-CM | POA: Diagnosis present

## 2011-03-18 DIAGNOSIS — D62 Acute posthemorrhagic anemia: Secondary | ICD-10-CM | POA: Diagnosis not present

## 2011-03-18 DIAGNOSIS — I10 Essential (primary) hypertension: Secondary | ICD-10-CM | POA: Diagnosis present

## 2011-03-18 DIAGNOSIS — F17200 Nicotine dependence, unspecified, uncomplicated: Secondary | ICD-10-CM | POA: Diagnosis present

## 2011-03-18 DIAGNOSIS — R079 Chest pain, unspecified: Secondary | ICD-10-CM | POA: Diagnosis present

## 2011-03-18 DIAGNOSIS — I6529 Occlusion and stenosis of unspecified carotid artery: Secondary | ICD-10-CM | POA: Diagnosis present

## 2011-03-18 DIAGNOSIS — F411 Generalized anxiety disorder: Secondary | ICD-10-CM | POA: Diagnosis present

## 2011-03-18 DIAGNOSIS — K219 Gastro-esophageal reflux disease without esophagitis: Secondary | ICD-10-CM | POA: Diagnosis present

## 2011-03-18 HISTORY — DX: Alcohol abuse, in remission: F10.11

## 2011-03-18 LAB — CBC
HCT: 45.8 % (ref 39.0–52.0)
Hemoglobin: 16.3 g/dL (ref 13.0–17.0)
Hemoglobin: 14.7 g/dL (ref 13.0–17.0)
MCH: 33.7 pg (ref 26.0–34.0)
MCV: 94.6 fL (ref 78.0–100.0)
Platelets: 181 10*3/uL (ref 150–400)
RBC: 4.84 MIL/uL (ref 4.22–5.81)
RBC: 4.43 MIL/uL (ref 4.22–5.81)
WBC: 8.5 10*3/uL (ref 4.0–10.5)
WBC: 8.2 10*3/uL (ref 4.0–10.5)

## 2011-03-18 LAB — COMPREHENSIVE METABOLIC PANEL
ALT: 19 U/L (ref 0–53)
BUN: 11 mg/dL (ref 6–23)
CO2: 23 mEq/L (ref 19–32)
Calcium: 9.8 mg/dL (ref 8.4–10.5)
Creatinine, Ser: 1.14 mg/dL (ref 0.50–1.35)
Glucose, Bld: 88 mg/dL (ref 70–99)
Sodium: 139 mEq/L (ref 135–145)

## 2011-03-18 LAB — URINE MICROSCOPIC-ADD ON

## 2011-03-18 LAB — CARDIAC PANEL(CRET KIN+CKTOT+MB+TROPI)
CK, MB: 1.3 ng/mL (ref 0.3–4.0)
Relative Index: INVALID (ref 0.0–2.5)
Troponin I: <0.3 ng/mL (ref <0.30)

## 2011-03-18 LAB — CREATININE, SERUM
Creatinine, Ser: 1.24 mg/dL (ref 0.50–1.35)
GFR calc Af Amer: 67 mL/min — ABNORMAL LOW (ref >90)
GFR calc non Af Amer: 58 mL/min — ABNORMAL LOW (ref >90)

## 2011-03-18 LAB — URINALYSIS, ROUTINE W REFLEX MICROSCOPIC
Hgb urine dipstick: NEGATIVE
Ketones, ur: 15 mg/dL — AB
Protein, ur: NEGATIVE mg/dL
Urobilinogen, UA: 0.2 mg/dL (ref 0.0–1.0)

## 2011-03-18 LAB — DIFFERENTIAL
Eosinophils Absolute: 0.1 10*3/uL (ref 0.0–0.7)
Eosinophils Relative: 2 % (ref 0–5)
Lymphocytes Relative: 32 % (ref 12–46)
Lymphs Abs: 2.7 10*3/uL (ref 0.7–4.0)
Monocytes Absolute: 0.7 10*3/uL (ref 0.1–1.0)
Monocytes Relative: 9 % (ref 3–12)

## 2011-03-18 LAB — TROPONIN I: Troponin I: <0.3 ng/mL (ref <0.30)

## 2011-03-18 MED ORDER — ACETAMINOPHEN 650 MG RE SUPP: 650.0000 mg | Freq: Four times a day (QID) | RECTAL | Status: DC | PRN

## 2011-03-18 MED ORDER — ONDANSETRON HCL 4 MG PO TABS: 4.0000 mg | ORAL_TABLET | Freq: Four times a day (QID) | ORAL | Status: DC | PRN

## 2011-03-18 MED ORDER — AMLODIPINE BESYLATE 10 MG PO TABS
10.0000 mg | ORAL_TABLET | Freq: Every day | ORAL | Status: DC
  Administered 2011-03-18 – 2011-03-23 (×6): 10 mg via ORAL
  Filled 2011-03-18 (×6): qty 1

## 2011-03-18 MED ORDER — LORAZEPAM 0.5 MG PO TABS
0.5000 mg | ORAL_TABLET | Freq: Two times a day (BID) | ORAL | Status: DC
  Administered 2011-03-18 – 2011-03-23 (×10): 0.5 mg via ORAL
  Filled 2011-03-18 (×10): qty 1

## 2011-03-18 MED ORDER — ASPIRIN 325 MG PO TABS
325.0000 mg | ORAL_TABLET | Freq: Every day | ORAL | Status: DC
  Administered 2011-03-18 – 2011-03-23 (×5): 325 mg via ORAL
  Filled 2011-03-18 (×6): qty 1

## 2011-03-18 MED ORDER — SODIUM CHLORIDE 0.9 % IV SOLN
INTRAVENOUS | Status: DC
  Administered 2011-03-18: 23:00:00 via INTRAVENOUS

## 2011-03-18 MED ORDER — SIMVASTATIN 20 MG PO TABS
20.0000 mg | ORAL_TABLET | Freq: Every day | ORAL | Status: DC
  Filled 2011-03-18: qty 1

## 2011-03-18 MED ORDER — NITROGLYCERIN 0.4 MG/HR TD PT24
0.4000 mg | MEDICATED_PATCH | Freq: Every day | TRANSDERMAL | Status: DC
  Administered 2011-03-18 – 2011-03-23 (×6): 0.4 mg via TRANSDERMAL
  Filled 2011-03-18 (×6): qty 1

## 2011-03-18 MED ORDER — ONDANSETRON HCL 4 MG/2ML IJ SOLN: 4.0000 mg | Freq: Four times a day (QID) | INTRAMUSCULAR | Status: DC | PRN

## 2011-03-18 MED ORDER — PANTOPRAZOLE SODIUM 40 MG PO TBEC
40.0000 mg | DELAYED_RELEASE_TABLET | Freq: Every day | ORAL | Status: DC
  Administered 2011-03-18 – 2011-03-22 (×4): 40 mg via ORAL
  Filled 2011-03-18 (×3): qty 1

## 2011-03-18 MED ORDER — ASPIRIN EC 325 MG PO TBEC: 325.0000 mg | DELAYED_RELEASE_TABLET | Freq: Every day | ORAL | Status: DC

## 2011-03-18 MED ORDER — SODIUM CHLORIDE 0.9 % IJ SOLN
3.0000 mL | Freq: Two times a day (BID) | INTRAMUSCULAR | Status: DC
  Administered 2011-03-19 – 2011-03-23 (×7): 3 mL via INTRAVENOUS

## 2011-03-18 MED ORDER — METOPROLOL TARTRATE 25 MG PO TABS
37.5000 mg | ORAL_TABLET | Freq: Two times a day (BID) | ORAL | Status: DC
  Administered 2011-03-18 – 2011-03-22 (×9): 37.5 mg via ORAL
  Administered 2011-03-23: 12.5 mg via ORAL
  Filled 2011-03-18 (×12): qty 1

## 2011-03-18 MED ORDER — ENOXAPARIN SODIUM 40 MG/0.4ML ~~LOC~~ SOLN
40.0000 mg | SUBCUTANEOUS | Status: DC
  Administered 2011-03-18: 40 mg via SUBCUTANEOUS
  Filled 2011-03-18 (×2): qty 0.4

## 2011-03-18 MED ORDER — SODIUM CHLORIDE 0.9 % IV SOLN
INTRAVENOUS | Status: DC
  Administered 2011-03-18: 1000 mL via INTRAVENOUS

## 2011-03-18 MED ORDER — ACETAMINOPHEN 325 MG PO TABS
650.0000 mg | ORAL_TABLET | Freq: Four times a day (QID) | ORAL | Status: DC | PRN
  Administered 2011-03-19 – 2011-03-21 (×3): 650 mg via ORAL
  Filled 2011-03-18 (×3): qty 2

## 2011-03-18 NOTE — ED Notes (Signed)
Report called to [facility] RN  [phone]  Patient to [phone]

## 2011-03-18 NOTE — ED Notes (Addendum)
Patient states she has been having chest pain x 1 week. Patient states the pain is intermittent until today and this afternoon the pain worsened and constant prior to arrival to the ED. Patient states he was SOB on way in to the ED. Patient denies N/V and denies chest pain at this time. Patient states sitting up/forward usually helps the pain. Patient placed on monitor and 2L oxygen with sats of 99%. EKG captured on arrival and patient states he took 324 mg aspirin at home before EMS arrived.

## 2011-03-18 NOTE — ED Notes (Signed)
Patient states chest pain off and on x 1 week; no radiation , no n/v/diaphoresis.  States Hx of GERD.  Denies pain on arrival

## 2011-03-18 NOTE — ED Notes (Signed)
MD at bedside. Dr. Kakrakandy  

## 2011-03-18 NOTE — ED Notes (Signed)
Received report assumed patient  Care walking rounds completed.

## 2011-03-18 NOTE — ED Notes (Signed)
EKG capture at 1742.

## 2011-03-18 NOTE — ED Notes (Signed)
Patient returned from xray. Patient remains on monitor and 2L oxygen with sats of 98 with NAD, Family at bedside.

## 2011-03-18 NOTE — H&P (Signed)
[name] is an 71 y.o. male.   PCP - Jose Paz([place]). Chief Complaint: Chest pain. HPI: 71 year-old male with history of hypertension, ongoing tobacco abuse, hyperlipidemia, history of right-sided carotid endarterectomy presented to the ER because of chest pain. Patient has been experiencing chest pain off and on for last few weeks. Chest pain is usually retrosternal left-sided nonradiating and lasts a few minutes each time. But today at around 4 PM patient's chest pain persisted for half an hour with mild shortness of breath which made the patient concerned and he called EMS. By the time patient reached the ER his chest pain improved. His EKG cardiac enzymes and chest x-ray did not show any acute. Patient has been admitted for further workup. Patient's chest pain also happens with exertion but denies any nausea vomiting did have some diaphoresis earlier today during the chest pain episode but denies any palpitations dizziness loss of consciousness abdominal pain fever chills cough.  Past Medical History  Diagnosis Date  . Carotid arterial disease     s/p surgery 11/2008  . Hypertension   . Colonic polyp     cscopes 08/18/05 tics,polyps.Marland Kitchenall hyperplastic  . GERD (gastroesophageal reflux disease)     Barrets Esop. (per bx 08/2005) and HH f/u Dr.Schooler  . Anal warts   . Anal fissure   . Hemorrhoids   . 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 Panic attack   . Osteoarthritis   . Depression     Past Surgical History  Procedure Date  . Right carotid endartectomy with dacron patch angioplasty 11/2008  . Appendectomy   . Knee surgery   . Foot surgery     fx  . Total hip arthroplasty 2005    R due to OA  . Skin tags excised   . Transurethral resection of prostate   . Cataract extraction     wears contact lens L, good vision    Family History  Problem Relation Age of Onset  . Lung cancer Father     mets to brain- deceased  . Hypertension  Mother   . Arthritis Mother   . Coronary artery disease Neg Hx   . Hypertension Mother   . Diabetes Paternal Aunt   . Stroke Paternal Grandfather   . Colon cancer Neg Hx   . Prostate cancer Neg Hx    Social History:  reports that he has been smoking.  He has never used smokeless tobacco. He reports that he does not drink alcohol or use illicit drugs.  Allergies:  Allergies  Allergen Reactions  . Sertraline Hcl     REACTION: tongue bleed    Medications Prior to Admission  Medication Dose Route Frequency Provider Last Rate Last Dose  . 0.9 %  sodium chloride infusion   Intravenous Continuous Laray Anger, DO 75 mL/hr at 03/18/11 1802 1,000 mL at 03/18/11 1802   Medications Prior to Admission  Medication Sig Dispense Refill  . aspirin 325 MG tablet Take 325 mg by mouth daily.        Marland Kitchen omeprazole (PRILOSEC) 40 MG capsule Take 40 mg by mouth daily.      . pravastatin (PRAVACHOL) 40 MG tablet Take 40 mg by mouth daily.      Marland Kitchen DISCONTD: omeprazole (PRILOSEC) 40 MG capsule Take 1 capsule (40 mg total) by mouth daily.  90 capsule  1  . DISCONTD: pravastatin (PRAVACHOL) 40 MG tablet Take 1 tablet (  40 mg total) by mouth daily.  90 tablet  1    Results for orders placed during the hospital encounter of 03/18/11 (from the past 48 hour(s))  CBC     Status: Normal   Collection Time   03/18/11  5:49 PM      Component Value Range Comment   WBC 8.5  4.0 - 10.5 (K/uL)    RBC 4.84  4.22 - 5.81 (MIL/uL)    Hemoglobin 16.3  13.0 - 17.0 (g/dL)    HCT 16.1  09.6 - 04.5 (%)    MCV 94.6  78.0 - 100.0 (fL)    MCH 33.7  26.0 - 34.0 (pg)    MCHC 35.6  30.0 - 36.0 (g/dL)    RDW 40.9  81.1 - 91.4 (%)    Platelets 179  150 - 400 (K/uL)   DIFFERENTIAL     Status: Normal   Collection Time   03/18/11  5:49 PM      Component Value Range Comment   Neutrophils Relative 58  43 - 77 (%)    Neutro Abs 4.9  1.7 - 7.7 (K/uL)    Lymphocytes Relative 32  12 - 46 (%)    Lymphs Abs 2.7  0.7 - 4.0 (K/uL)     Monocytes Relative 9  3 - 12 (%)    Monocytes Absolute 0.7  0.1 - 1.0 (K/uL)    Eosinophils Relative 2  0 - 5 (%)    Eosinophils Absolute 0.1  0.0 - 0.7 (K/uL)    Basophils Relative 0  0 - 1 (%)    Basophils Absolute 0.0  0.0 - 0.1 (K/uL)   COMPREHENSIVE METABOLIC PANEL     Status: Abnormal   Collection Time   03/18/11  5:49 PM      Component Value Range Comment   Sodium 139  135 - 145 (mEq/L)    Potassium 4.1  3.5 - 5.1 (mEq/L)    Chloride 106  96 - 112 (mEq/L)    CO2 23  19 - 32 (mEq/L)    Glucose, Bld 88  70 - 99 (mg/dL)    BUN 11  6 - 23 (mg/dL)    Creatinine, Ser 7.82  0.50 - 1.35 (mg/dL)    Calcium 9.8  8.4 - 10.5 (mg/dL)    Total Protein 7.5  6.0 - 8.3 (g/dL)    Albumin 3.8  3.5 - 5.2 (g/dL)    AST 18  0 - 37 (U/L)    ALT 19  0 - 53 (U/L)    Alkaline Phosphatase 102  39 - 117 (U/L)    Total Bilirubin 0.5  0.3 - 1.2 (mg/dL)    GFR calc non Af Amer 64 (*) >90 (mL/min)    GFR calc Af Amer 74 (*) >90 (mL/min)   LIPASE, BLOOD     Status: Normal   Collection Time   03/18/11  5:49 PM      Component Value Range Comment   Lipase 31  11 - 59 (U/L)   TROPONIN I     Status: Normal   Collection Time   03/18/11  5:49 PM      Component Value Range Comment   Troponin I <0.30  <0.30 (ng/mL)   URINALYSIS, ROUTINE W REFLEX MICROSCOPIC     Status: Abnormal   Collection Time   03/18/11  6:24 PM      Component Value Range Comment   Color, Urine YELLOW  YELLOW     APPearance CLEAR  CLEAR     Specific Gravity, Urine 1.012  1.005 - 1.030     pH 7.5  5.0 - 8.0     Glucose, UA NEGATIVE  NEGATIVE (mg/dL)    Hgb urine dipstick NEGATIVE  NEGATIVE     Bilirubin Urine NEGATIVE  NEGATIVE     Ketones, ur 15 (*) NEGATIVE (mg/dL)    Protein, ur NEGATIVE  NEGATIVE (mg/dL)    Urobilinogen, UA 0.2  0.0 - 1.0 (mg/dL)    Nitrite NEGATIVE  NEGATIVE     Leukocytes, UA SMALL (*) NEGATIVE    URINE MICROSCOPIC-ADD ON     Status: Normal   Collection Time   03/18/11  6:24 PM      Component Value Range Comment    Squamous Epithelial / LPF RARE  RARE     WBC, UA 3-6  <3 (WBC/hpf)    Bacteria, UA RARE  RARE     Urine-Other AMORPHOUS URATES/PHOSPHATES      Dg Chest 2 View  03/18/2011  *RADIOLOGY REPORT*  Clinical Data: Chest pain.  Bilateral arm numbness.  Diaphoresis.  CHEST - 2 VIEW  Comparison: 11/14/2008 and 05/28/2003  Findings: The heart size and vascularity are normal and the lungs are clear.  No acute osseous abnormality.  Slight mid thoracic scoliosis.  IMPRESSION: No acute abnormalities.  Original Report Authenticated By: Gwynn Burly, M.D.    Review of Systems  Constitutional: Negative.   HENT: Negative.   Eyes: Negative.   Respiratory: Negative.   Cardiovascular: Positive for chest pain.  Gastrointestinal: Negative.   Genitourinary: Negative.   Musculoskeletal: Negative.   Skin: Negative.   Neurological: Negative.   Endo/Heme/Allergies: Negative.   Psychiatric/Behavioral: Negative.     Blood pressure 170/82, pulse 79, temperature 98.2 F (36.8 C), temperature source Oral, resp. rate 16, SpO2 99.00%. Physical Exam  Constitutional: He is oriented to person, place, and time. He appears well-developed and well-nourished. No distress.  HENT:  Head: Normocephalic and atraumatic.  Right Ear: External ear normal.  Left Ear: External ear normal.  Nose: Nose normal.  Mouth/Throat: Oropharynx is clear and moist. No oropharyngeal exudate.  Eyes: Conjunctivae are normal. Pupils are equal, round, and reactive to light. Right eye exhibits no discharge. Left eye exhibits no discharge. No scleral icterus.  Neck: Normal range of motion. Neck supple.  Cardiovascular: Normal rate and regular rhythm.   Respiratory: Effort normal and breath sounds normal. No respiratory distress. He has no wheezes. He has no rales.  GI: Soft. Bowel sounds are normal. He exhibits no distension. There is no tenderness. There is no rebound.  Musculoskeletal: Normal range of motion. He exhibits no edema and no  tenderness.  Neurological: He is alert and oriented to person, place, and time.       Moves all extremities.  Skin: Skin is warm and dry. No rash noted. He is not diaphoretic. No erythema.  Psychiatric: His behavior is normal.     Assessment/Plan #1. Chest pain to rule out ACS - given the patient's symptoms and risk factors (hyperlipidemia , tobacco abuse and carotid endarterectomy and hypertension ) patient's chest pain is concerning for cardiac etiology. At this time we will cycle cardiac markers and keep patient n.p.o. past midnight. Patient will be on nitroglycerin paste and aspirin. Consult cardiology in a.m. #2. Hypertension  - continue present medications #3. Tobacco abuse  - I have counseled patient to quit tobacco. #4. Hyperlipidemia  - continue statins check lipid panel. #5. History of carotid endarterectomy .  CODE STATUS  - full code.   Eduard Clos 03/18/2011, 8:55 PM

## 2011-03-18 NOTE — ED Provider Notes (Signed)
History     CSN: [id number]  Arrival date & time 03/18/11  1731   First MD Initiated Contact with Patient 03/18/11 1738      Chief Complaint  Patient presents with  . Chest Pain    HPI Pt was seen at 1740.  Per pt, c/o gradual onset and worsening of multiple intermittent episodes of mid-sternal chest "pain" for the past week.  Pt states the pain became worse today.  Has been assoc with SOB.  Lasts approx 10 min each episode before resolving.  Pt took ASA 325mg  PTA.  Pt denies symptoms currently.  Denies palpitations, no cough, no back pain, no abd pain, no N/V/D, no fevers.     Past Medical History  Diagnosis Date  . Carotid arterial disease     s/p surgery 11/2008  . Hypertension   . Colonic polyp     cscopes 08/18/05 tics,polyps.Marland Kitchenall hyperplastic  . GERD (gastroesophageal reflux disease)     Barrets Esop. (per bx 08/2005) and HH f/u Dr.Schooler  . Anal warts   . Anal fissure   . Hemorrhoids   . 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 Panic attack   . Osteoarthritis   . Depression     Past Surgical History  Procedure Date  . Right carotid endartectomy with dacron patch angioplasty 11/2008  . Appendectomy   . Knee surgery   . Foot surgery     fx  . Total hip arthroplasty 2005    R due to OA  . Skin tags excised   . Transurethral resection of prostate   . Cataract extraction     wears contact lens L, good vision    Family History  Problem Relation Age of Onset  . Lung cancer Father     mets to brain- deceased  . Hypertension Mother   . Arthritis Mother   . Coronary artery disease Neg Hx   . Hypertension Mother   . Diabetes Paternal Aunt   . Stroke Paternal Grandfather   . Colon cancer Neg Hx   . Prostate cancer Neg Hx     History  Substance Use Topics  . Smoking status: Current Everyday Smoker -- 1.0 packs/day  . Smokeless tobacco: Never Used  . Alcohol Use: No     ETOH abuse , currently not drinking    Review of  Systems ROS: Statement: All systems negative except as marked or noted in the HPI; Constitutional: Negative for fever and chills. ; ; Eyes: Negative for eye pain, redness and discharge. ; ; ENMT: Negative for ear pain, hoarseness, nasal congestion, sinus pressure and sore throat. ; ; Cardiovascular: +CP, SOB. Negative for palpitations, diaphoresis, and peripheral edema. ; ; Respiratory: Negative for cough, wheezing and stridor. ; ; Gastrointestinal: Negative for nausea, vomiting, diarrhea, abdominal pain, blood in stool, hematemesis, jaundice and rectal bleeding. . ; ; Genitourinary: Negative for dysuria, flank pain and hematuria. ; ; Musculoskeletal: Negative for back pain and neck pain. Negative for swelling and trauma.; ; Skin: Negative for pruritus, rash, abrasions, blisters, bruising and skin lesion.; ; Neuro: Negative for headache, lightheadedness and neck stiffness. Negative for weakness, altered level of consciousness , altered mental status, extremity weakness, paresthesias, involuntary movement, seizure and syncope.     Allergies  Sertraline hcl  Home Medications   Current Outpatient Rx  Name Route Sig Dispense Refill  . AMLODIPINE BESYLATE 10 MG PO TABS Oral Take 10 mg by mouth  daily.    . ASPIRIN 325 MG PO TABS Oral Take 325 mg by mouth daily.      Marland Kitchen DOCUSATE SODIUM 100 MG PO CAPS Oral Take 100 mg by mouth 2 (two) times daily.    Marland Kitchen LORAZEPAM 0.5 MG PO TABS Oral Take 0.5 mg by mouth 2 (two) times daily.    Marland Kitchen METOPROLOL TARTRATE 25 MG PO TABS Oral Take 37.5 mg by mouth 2 (two) times daily.    Marland Kitchen OMEPRAZOLE 40 MG PO CPDR Oral Take 40 mg by mouth daily.    Marland Kitchen PRAVASTATIN SODIUM 40 MG PO TABS Oral Take 40 mg by mouth daily.      BP 161/87  Pulse 83  Temp(Src) 98.2 F (36.8 C) (Oral)  Resp 17  SpO2 96%  Physical Exam 1745: Physical examination:  Nursing notes reviewed; Vital signs and O2 SAT reviewed;  Constitutional: Well developed, Well nourished, Well hydrated, In no acute  distress; Head:  Normocephalic, atraumatic; Eyes: EOMI, PERRL, No scleral icterus; ENMT: Mouth and pharynx normal, Mucous membranes moist; Neck: Supple, Full range of motion, No lymphadenopathy; Cardiovascular: Regular rate and rhythm, No murmur, rub, or gallop; Respiratory: Breath sounds clear & equal bilaterally, No rales, rhonchi, wheezes, or rub, Normal respiratory effort/excursion; Chest: Nontender, Movement normal; Abdomen: Soft, Nontender, Nondistended, Normal bowel sounds; Extremities: Pulses normal, No tenderness, No edema, No calf edema or asymmetry.; Neuro: AA&Ox3, Major CN grossly intact.  No gross focal motor or sensory deficits in extremities.; Skin: Color normal, Warm, Dry.   ED Course  Procedures   MDM  MDM Reviewed: nursing note, vitals and previous chart Reviewed previous: ECG Interpretation: ECG, labs and x-ray    Date: 03/18/2011  Rate: 73  Rhythm: normal sinus rhythm  QRS Axis: normal  Intervals: normal  ST/T Wave abnormalities: nonspecific T wave changes  Conduction Disutrbances:nonspecific intraventricular conduction delay  Narrative Interpretation:   Old EKG Reviewed: changes noted; IVCD and flipped T-wave lead avL compared to previous EKG dated 11/15/2008.  Results for orders placed during the hospital encounter of 03/18/11  CBC      Component Value Range   WBC 8.5  4.0 - 10.5 (K/uL)   RBC 4.84  4.22 - 5.81 (MIL/uL)   Hemoglobin 16.3  13.0 - 17.0 (g/dL)   HCT 16.1  09.6 - 04.5 (%)   MCV 94.6  78.0 - 100.0 (fL)   MCH 33.7  26.0 - 34.0 (pg)   MCHC 35.6  30.0 - 36.0 (g/dL)   RDW 40.9  81.1 - 91.4 (%)   Platelets 179  150 - 400 (K/uL)  DIFFERENTIAL      Component Value Range   Neutrophils Relative 58  43 - 77 (%)   Neutro Abs 4.9  1.7 - 7.7 (K/uL)   Lymphocytes Relative 32  12 - 46 (%)   Lymphs Abs 2.7  0.7 - 4.0 (K/uL)   Monocytes Relative 9  3 - 12 (%)   Monocytes Absolute 0.7  0.1 - 1.0 (K/uL)   Eosinophils Relative 2  0 - 5 (%)   Eosinophils  Absolute 0.1  0.0 - 0.7 (K/uL)   Basophils Relative 0  0 - 1 (%)   Basophils Absolute 0.0  0.0 - 0.1 (K/uL)  COMPREHENSIVE METABOLIC PANEL      Component Value Range   Sodium 139  135 - 145 (mEq/L)   Potassium 4.1  3.5 - 5.1 (mEq/L)   Chloride 106  96 - 112 (mEq/L)   CO2 23  19 -  32 (mEq/L)   Glucose, Bld 88  70 - 99 (mg/dL)   BUN 11  6 - 23 (mg/dL)   Creatinine, Ser 1.61  0.50 - 1.35 (mg/dL)   Calcium 9.8  8.4 - 09.6 (mg/dL)   Total Protein 7.5  6.0 - 8.3 (g/dL)   Albumin 3.8  3.5 - 5.2 (g/dL)   AST 18  0 - 37 (U/L)   ALT 19  0 - 53 (U/L)   Alkaline Phosphatase 102  39 - 117 (U/L)   Total Bilirubin 0.5  0.3 - 1.2 (mg/dL)   GFR calc non Af Amer 64 (*) >90 (mL/min)   GFR calc Af Amer 74 (*) >90 (mL/min)  LIPASE, BLOOD      Component Value Range   Lipase 31  11 - 59 (U/L)  URINALYSIS, ROUTINE W REFLEX MICROSCOPIC      Component Value Range   Color, Urine YELLOW  YELLOW    APPearance CLEAR  CLEAR    Specific Gravity, Urine 1.012  1.005 - 1.030    pH 7.5  5.0 - 8.0    Glucose, UA NEGATIVE  NEGATIVE (mg/dL)   Hgb urine dipstick NEGATIVE  NEGATIVE    Bilirubin Urine NEGATIVE  NEGATIVE    Ketones, ur 15 (*) NEGATIVE (mg/dL)   Protein, ur NEGATIVE  NEGATIVE (mg/dL)   Urobilinogen, UA 0.2  0.0 - 1.0 (mg/dL)   Nitrite NEGATIVE  NEGATIVE    Leukocytes, UA SMALL (*) NEGATIVE   TROPONIN I      Component Value Range   Troponin I <0.30  <0.30 (ng/mL)  URINE MICROSCOPIC-ADD ON      Component Value Range   Squamous Epithelial / LPF RARE  RARE    WBC, UA 3-6  <3 (WBC/hpf)   Bacteria, UA RARE  RARE    Urine-Other AMORPHOUS URATES/PHOSPHATES     Dg Chest 2 View 03/18/2011  *RADIOLOGY REPORT*  Clinical Data: Chest pain.  Bilateral arm numbness.  Diaphoresis.  CHEST - 2 VIEW  Comparison: 11/14/2008 and 05/28/2003  Findings: The heart size and vascularity are normal and the lungs are clear.  No acute osseous abnormality.  Slight mid thoracic scoliosis.  IMPRESSION: No acute abnormalities.   Original Report Authenticated By: Gwynn Burly, M.D.    1925:  Continues without symptoms.  Dx testing d/w pt and family.  Questions answered.  Verb understanding, agreeable to admit. T/C to [facility]. Toniann Fail, case discussed, including:  HPI, pertinent PM/SHx, VS/PE, dx testing, ED course and treatment:  Agreeable to admit, requests to obtain tele bed to team 7.           Laray Anger, DO 03/19/11 1620

## 2011-03-19 ENCOUNTER — Encounter (HOSPITAL_COMMUNITY)
Admission: EM | Disposition: A | Discharge status: Skilled Nursing Facility | Payer: Self-pay | Source: Ambulatory Visit | Attending: Thoracic Surgery (Cardiothoracic Vascular Surgery)

## 2011-03-19 ENCOUNTER — Encounter (HOSPITAL_COMMUNITY): Payer: Self-pay | Admitting: Physician Assistant

## 2011-03-19 ENCOUNTER — Other Ambulatory Visit: Payer: Self-pay

## 2011-03-19 DIAGNOSIS — R072 Precordial pain: Secondary | ICD-10-CM

## 2011-03-19 DIAGNOSIS — I2510 Atherosclerotic heart disease of native coronary artery without angina pectoris: Secondary | ICD-10-CM

## 2011-03-19 HISTORY — PX: LEFT HEART CATHETERIZATION WITH CORONARY ANGIOGRAM: SHX5451

## 2011-03-19 LAB — URINE CULTURE
Colony Count: NO GROWTH
Culture  Setup Time: 201303070205
Culture: NO GROWTH

## 2011-03-19 LAB — PROTIME-INR: INR: 1.12 (ref 0.00–1.49)

## 2011-03-19 LAB — COMPREHENSIVE METABOLIC PANEL
Albumin: 3.1 g/dL — ABNORMAL LOW (ref 3.5–5.2)
BUN: 13 mg/dL (ref 6–23)
Chloride: 111 mEq/L (ref 96–112)
Creatinine, Ser: 1.31 mg/dL (ref 0.50–1.35)
GFR calc Af Amer: 63 mL/min — ABNORMAL LOW (ref >90)
GFR calc non Af Amer: 54 mL/min — ABNORMAL LOW (ref >90)
Glucose, Bld: 92 mg/dL (ref 70–99)
Total Bilirubin: 0.2 mg/dL — ABNORMAL LOW (ref 0.3–1.2)

## 2011-03-19 LAB — CBC
MCV: 96.3 fL (ref 78.0–100.0)
Platelets: 184 10*3/uL (ref 150–400)
RBC: 4.08 MIL/uL — ABNORMAL LOW (ref 4.22–5.81)
RDW: 12.8 % (ref 11.5–15.5)
WBC: 7.5 10*3/uL (ref 4.0–10.5)

## 2011-03-19 LAB — CARDIAC PANEL(CRET KIN+CKTOT+MB+TROPI)
CK, MB: 1.4 ng/mL (ref 0.3–4.0)
CK, MB: 1.5 ng/mL (ref 0.3–4.0)
Total CK: 46 U/L (ref 7–232)
Troponin I: <0.3 ng/mL (ref <0.30)

## 2011-03-19 LAB — LIPID PANEL
LDL Cholesterol: 70 mg/dL (ref 0–99)
Total CHOL/HDL Ratio: 5.2 RATIO
VLDL: 31 mg/dL (ref 0–40)

## 2011-03-19 LAB — TSH: TSH: 1.575 u[IU]/mL (ref 0.350–4.500)

## 2011-03-19 SURGERY — LEFT HEART CATHETERIZATION WITH CORONARY ANGIOGRAM
Anesthesia: LOCAL

## 2011-03-19 MED ORDER — SODIUM CHLORIDE 0.9 % IV SOLN
INTRAVENOUS | Status: AC
  Administered 2011-03-19: 12:00:00 via INTRAVENOUS

## 2011-03-19 MED ORDER — MIDAZOLAM HCL 2 MG/2ML IJ SOLN
INTRAMUSCULAR | Status: AC
  Filled 2011-03-19: qty 2

## 2011-03-19 MED ORDER — ASPIRIN 81 MG PO CHEW
324.0000 mg | CHEWABLE_TABLET | ORAL | Status: AC
  Administered 2011-03-19: 324 mg via ORAL
  Filled 2011-03-19: qty 4

## 2011-03-19 MED ORDER — HEPARIN (PORCINE) IN NACL 2-0.9 UNIT/ML-% IJ SOLN
INTRAMUSCULAR | Status: AC
  Filled 2011-03-19: qty 2000

## 2011-03-19 MED ORDER — ACETAMINOPHEN 325 MG PO TABS: 650.0000 mg | ORAL_TABLET | ORAL | Status: DC | PRN

## 2011-03-19 MED ORDER — FENTANYL CITRATE 0.05 MG/ML IJ SOLN
INTRAMUSCULAR | Status: AC
  Filled 2011-03-19: qty 2

## 2011-03-19 MED ORDER — ATORVASTATIN CALCIUM 40 MG PO TABS
40.0000 mg | ORAL_TABLET | Freq: Every day | ORAL | Status: DC
  Administered 2011-03-19 – 2011-03-22 (×4): 40 mg via ORAL
  Filled 2011-03-19 (×5): qty 1

## 2011-03-19 MED ORDER — SODIUM CHLORIDE 0.9 % IV SOLN: INTRAVENOUS | Status: DC

## 2011-03-19 MED ORDER — LIDOCAINE HCL (PF) 1 % IJ SOLN
INTRAMUSCULAR | Status: AC
  Filled 2011-03-19: qty 30

## 2011-03-19 MED ORDER — ONDANSETRON HCL 4 MG/2ML IJ SOLN: 4.0000 mg | Freq: Four times a day (QID) | INTRAMUSCULAR | Status: DC | PRN

## 2011-03-19 MED ORDER — NITROGLYCERIN 0.2 MG/ML ON CALL CATH LAB
INTRAVENOUS | Status: AC
  Filled 2011-03-19: qty 1

## 2011-03-19 NOTE — Significant Event (Signed)
Pt found in the floor kneeling per NT, pt became confused and stated that he was hearing a loud scream and yelling, tried to get out of bed but fell  on the floor and hit his knees, no injury noted to his bilateral knee, pt placed back in bed, after few minutes pt became oriented and recalled that he was having a bad dream. Pt's right groin site non tender, no bleeding or hematoma noted band aid intact. Pt advised to call for help and bed alarm was activated. Benedetto Coons notified and ordered to monitor pt closely.  Filed Vitals:   03/19/11 2313  BP: 117/67  Pulse: 80  Temp: 98.1 F (36.7 C)  Resp: 20   Chanta Bauers, [address]

## 2011-03-19 NOTE — Consult Note (Signed)
Reason for Consult:3 vessel CAD with unstable angina Referring Physician: Kristeen Miss, MD  [name] is an 71 y.o. male.  HPI: 71 yo Wm with cc/o chest pain. No prior cardiac history. 4 week history of CP. He describes this as a sharp, burning pain running horizontally across his chest just below the nipple line. No radiation to arms, back, neck or jaw. He began having episodes every few days initially, then progressed to once a day, then several episodes a day. 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  Past Medical History  Diagnosis Date  . Carotid arterial disease     s/p RCEA 11/2008 (Note: hx of negative cardiac nuc 03/2009)  . Hypertension   . Colonic polyp     cscopes 08/18/05 tics,polyps.Marland Kitchenall hyperplastic  . GERD (gastroesophageal reflux disease)     Barrets Esop. (per bx 08/2005) and HH f/u Dr.Schooler  . Anal warts   . Anal fissure   . Hemorrhoids   . 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 Panic attack   . Osteoarthritis   . Depression   . H/O ETOH abuse     Former. Quit ~2012    Past Surgical History  Procedure Date  . Right carotid endartectomy with dacron patch angioplasty 11/2008  . Appendectomy   . Knee surgery   . Foot surgery     fx  . Total hip arthroplasty 2005    R due to OA  . Skin tags excised   . Transurethral resection of prostate   . Cataract extraction     wears contact lens L, good vision    Family History  Problem Relation Age of Onset  . Lung cancer Father     mets to brain- deceased  . Arthritis Mother   . Hypertension Mother   . Coronary artery disease Neg Hx   . Colon cancer Neg Hx   . Prostate cancer Neg Hx   . Diabetes Paternal Aunt   . Stroke Paternal Grandfather     Social History:  reports that he has been smoking Cigarettes.  He has a 52 pack-year smoking history. He has never  used smokeless tobacco. He reports that he does not drink alcohol or use illicit drugs.  Allergies:  Allergies  Allergen Reactions  . Sertraline Hcl     REACTION: tongue bleed    Medications:  I have reviewed the patient's current medications. Prior to Admission:  Prescriptions prior to admission  Medication Sig Dispense Refill  . amLODipine (NORVASC) 10 MG tablet Take 10 mg by mouth daily.      Marland Kitchen aspirin 325 MG tablet Take 325 mg by mouth daily.        Marland Kitchen docusate sodium (COLACE) 100 MG capsule Take 100 mg by mouth 2 (two) times daily.      Marland Kitchen LORazepam (ATIVAN) 0.5 MG tablet Take 0.5 mg by mouth 2 (two) times daily.      . metoprolol tartrate (LOPRESSOR) 25 MG tablet Take 37.5 mg by mouth 2 (two) times daily.      Marland Kitchen omeprazole (PRILOSEC) 40 MG capsule Take 40 mg by mouth daily.      . pravastatin (PRAVACHOL) 40 MG tablet Take 40 mg by mouth daily.        Results for orders placed during the hospital encounter of 03/18/11 (from the  past 48 hour(s))  CBC     Status: Normal   Collection Time   03/18/11  5:49 PM      Component Value Range Comment   WBC 8.5  4.0 - 10.5 (K/uL)    RBC 4.84  4.22 - 5.81 (MIL/uL)    Hemoglobin 16.3  13.0 - 17.0 (g/dL)    HCT 16.1  09.6 - 04.5 (%)    MCV 94.6  78.0 - 100.0 (fL)    MCH 33.7  26.0 - 34.0 (pg)    MCHC 35.6  30.0 - 36.0 (g/dL)    RDW 40.9  81.1 - 91.4 (%)    Platelets 179  150 - 400 (K/uL)   DIFFERENTIAL     Status: Normal   Collection Time   03/18/11  5:49 PM      Component Value Range Comment   Neutrophils Relative 58  43 - 77 (%)    Neutro Abs 4.9  1.7 - 7.7 (K/uL)    Lymphocytes Relative 32  12 - 46 (%)    Lymphs Abs 2.7  0.7 - 4.0 (K/uL)    Monocytes Relative 9  3 - 12 (%)    Monocytes Absolute 0.7  0.1 - 1.0 (K/uL)    Eosinophils Relative 2  0 - 5 (%)    Eosinophils Absolute 0.1  0.0 - 0.7 (K/uL)    Basophils Relative 0  0 - 1 (%)    Basophils Absolute 0.0  0.0 - 0.1 (K/uL)   COMPREHENSIVE METABOLIC PANEL     Status: Abnormal     Collection Time   03/18/11  5:49 PM      Component Value Range Comment   Sodium 139  135 - 145 (mEq/L)    Potassium 4.1  3.5 - 5.1 (mEq/L)    Chloride 106  96 - 112 (mEq/L)    CO2 23  19 - 32 (mEq/L)    Glucose, Bld 88  70 - 99 (mg/dL)    BUN 11  6 - 23 (mg/dL)    Creatinine, Ser 7.82  0.50 - 1.35 (mg/dL)    Calcium 9.8  8.4 - 10.5 (mg/dL)    Total Protein 7.5  6.0 - 8.3 (g/dL)    Albumin 3.8  3.5 - 5.2 (g/dL)    AST 18  0 - 37 (U/L)    ALT 19  0 - 53 (U/L)    Alkaline Phosphatase 102  39 - 117 (U/L)    Total Bilirubin 0.5  0.3 - 1.2 (mg/dL)    GFR calc non Af Amer 64 (*) >90 (mL/min)    GFR calc Af Amer 74 (*) >90 (mL/min)   LIPASE, BLOOD     Status: Normal   Collection Time   03/18/11  5:49 PM      Component Value Range Comment   Lipase 31  11 - 59 (U/L)   TROPONIN I     Status: Normal   Collection Time   03/18/11  5:49 PM      Component Value Range Comment   Troponin I <0.30  <0.30 (ng/mL)   URINALYSIS, ROUTINE W REFLEX MICROSCOPIC     Status: Abnormal   Collection Time   03/18/11  6:24 PM      Component Value Range Comment   Color, Urine YELLOW  YELLOW     APPearance CLEAR  CLEAR     Specific Gravity, Urine 1.012  1.005 - 1.030     pH 7.5  5.0 - 8.0  Glucose, UA NEGATIVE  NEGATIVE (mg/dL)    Hgb urine dipstick NEGATIVE  NEGATIVE     Bilirubin Urine NEGATIVE  NEGATIVE     Ketones, ur 15 (*) NEGATIVE (mg/dL)    Protein, ur NEGATIVE  NEGATIVE (mg/dL)    Urobilinogen, UA 0.2  0.0 - 1.0 (mg/dL)    Nitrite NEGATIVE  NEGATIVE     Leukocytes, UA SMALL (*) NEGATIVE    URINE MICROSCOPIC-ADD ON     Status: Normal   Collection Time   03/18/11  6:24 PM      Component Value Range Comment   Squamous Epithelial / LPF RARE  RARE     WBC, UA 3-6  <3 (WBC/hpf)    Bacteria, UA RARE  RARE     Urine-Other AMORPHOUS URATES/PHOSPHATES     CARDIAC PANEL(CRET KIN+CKTOT+MB+TROPI)     Status: Normal   Collection Time   03/18/11 10:00 PM      Component Value Range Comment   Total CK 36  7 -  232 (U/L)    CK, MB 1.3  0.3 - 4.0 (ng/mL)    Troponin I <0.30  <0.30 (ng/mL)    Relative Index RELATIVE INDEX IS INVALID  0.0 - 2.5    CBC     Status: Normal   Collection Time   03/18/11 10:00 PM      Component Value Range Comment   WBC 8.2  4.0 - 10.5 (K/uL)    RBC 4.43  4.22 - 5.81 (MIL/uL)    Hemoglobin 14.7  13.0 - 17.0 (g/dL)    HCT 30.8  65.7 - 84.6 (%)    MCV 96.4  78.0 - 100.0 (fL)    MCH 33.2  26.0 - 34.0 (pg)    MCHC 34.4  30.0 - 36.0 (g/dL)    RDW 96.2  95.2 - 84.1 (%)    Platelets 181  150 - 400 (K/uL)   CREATININE, SERUM     Status: Abnormal   Collection Time   03/18/11 10:00 PM      Component Value Range Comment   Creatinine, Ser 1.24  0.50 - 1.35 (mg/dL)    GFR calc non Af Amer 58 (*) >90 (mL/min)    GFR calc Af Amer 67 (*) >90 (mL/min)   CARDIAC PANEL(CRET KIN+CKTOT+MB+TROPI)     Status: Normal   Collection Time   03/19/11  5:57 AM      Component Value Range Comment   Total CK 35  7 - 232 (U/L)    CK, MB 1.4  0.3 - 4.0 (ng/mL)    Troponin I <0.30  <0.30 (ng/mL)    Relative Index RELATIVE INDEX IS INVALID  0.0 - 2.5    COMPREHENSIVE METABOLIC PANEL     Status: Abnormal   Collection Time   03/19/11  5:57 AM      Component Value Range Comment   Sodium 144  135 - 145 (mEq/L)    Potassium 4.3  3.5 - 5.1 (mEq/L)    Chloride 111  96 - 112 (mEq/L)    CO2 26  19 - 32 (mEq/L)    Glucose, Bld 92  70 - 99 (mg/dL)    BUN 13  6 - 23 (mg/dL)    Creatinine, Ser 3.24  0.50 - 1.35 (mg/dL)    Calcium 9.1  8.4 - 10.5 (mg/dL)    Total Protein 6.1  6.0 - 8.3 (g/dL)    Albumin 3.1 (*) 3.5 - 5.2 (g/dL)    AST 15  0 - 37 (U/L)    ALT 14  0 - 53 (U/L)    Alkaline Phosphatase 82  39 - 117 (U/L)    Total Bilirubin 0.2 (*) 0.3 - 1.2 (mg/dL)    GFR calc non Af Amer 54 (*) >90 (mL/min)    GFR calc Af Amer 63 (*) >90 (mL/min)   CBC     Status: Abnormal   Collection Time   03/19/11  5:57 AM      Component Value Range Comment   WBC 7.5  4.0 - 10.5 (K/uL)    RBC 4.08 (*) 4.22 - 5.81  (MIL/uL)    Hemoglobin 13.3  13.0 - 17.0 (g/dL)    HCT 16.1  09.6 - 04.5 (%)    MCV 96.3  78.0 - 100.0 (fL)    MCH 32.6  26.0 - 34.0 (pg)    MCHC 33.8  30.0 - 36.0 (g/dL)    RDW 40.9  81.1 - 91.4 (%)    Platelets 184  150 - 400 (K/uL)   TSH     Status: Normal   Collection Time   03/19/11  5:57 AM      Component Value Range Comment   TSH 1.575  0.350 - 4.500 (uIU/mL)   LIPID PANEL     Status: Abnormal   Collection Time   03/19/11  5:57 AM      Component Value Range Comment   Cholesterol 125  0 - 200 (mg/dL)    Triglycerides 782 (*) <150 (mg/dL)    HDL 24 (*) >95 (mg/dL)    Total CHOL/HDL Ratio 5.2      VLDL 31  0 - 40 (mg/dL)    LDL Cholesterol 70  0 - 99 (mg/dL)   PROTIME-INR     Status: Normal   Collection Time   03/19/11  8:40 AM      Component Value Range Comment   Prothrombin Time 14.6  11.6 - 15.2 (seconds)    INR 1.12  0.00 - 1.49    CARDIAC PANEL(CRET KIN+CKTOT+MB+TROPI)     Status: Normal   Collection Time   03/19/11  2:10 PM      Component Value Range Comment   Total CK 46  7 - 232 (U/L)    CK, MB 1.5  0.3 - 4.0 (ng/mL)    Troponin I <0.30  <0.30 (ng/mL)    Relative Index RELATIVE INDEX IS INVALID  0.0 - 2.5      Dg Chest 2 View  03/18/2011  *RADIOLOGY REPORT*  Clinical Data: Chest pain.  Bilateral arm numbness.  Diaphoresis.  CHEST - 2 VIEW  Comparison: 11/14/2008 and 05/28/2003  Findings: The heart size and vascularity are normal and the lungs are clear.  No acute osseous abnormality.  Slight mid thoracic scoliosis.  IMPRESSION: No acute abnormalities.  Original Report Authenticated By: Gwynn Burly, M.D.    Review of Systems  Constitutional: Negative for fever, chills, weight loss, malaise/fatigue and diaphoresis.  HENT: Negative.   Respiratory: Negative for cough, hemoptysis, shortness of breath and wheezing.   Cardiovascular: Positive for chest pain.  Gastrointestinal: Positive for heartburn. Negative for nausea and vomiting.  Genitourinary:       Erectile  dysfunction  Musculoskeletal: Positive for joint pain.  Neurological: Negative.  Negative for weakness.  Endo/Heme/Allergies: Does not bruise/bleed easily.  All other systems reviewed and are negative.   Blood pressure 116/65, pulse 76, temperature 97.9 F (36.6 C), temperature source Oral, resp. rate 20, height 6' (1.829  m), weight 199 lb 9.6 oz (90.538 kg), SpO2 96.00%. Physical Exam  Vitals reviewed. Constitutional: He is oriented to person, place, and time. He appears well-developed and well-nourished.  HENT:  Head: Normocephalic and atraumatic.  Eyes: EOM are normal. Pupils are equal, round, and reactive to light.  Neck: No JVD present. No thyromegaly present.       Well healed scar right neck  Cardiovascular: Normal rate, regular rhythm, normal heart sounds and intact distal pulses.  Exam reveals no gallop and no friction rub.   No murmur heard. Respiratory: Effort normal and breath sounds normal. No respiratory distress. He has no wheezes. He has no rales.  GI: Soft. There is no tenderness.  Musculoskeletal: He exhibits no edema.  Lymphadenopathy:    He has no cervical adenopathy.  Neurological: He is alert and oriented to person, place, and time.       No focal deficits  Skin: Skin is warm and dry.    Assessment/Plan: 71 yo retired WM with multiple CRF, including ongoing tobacco abuse, presents with [place]. At cath has severe 3 vessel CAD.  CABG indicated for survival benefit and relief of symptoms. I have discussed with him the general nature of the procedure, need for general anesthesia, and incisions to be used. I have discussed the expected hospital stay, overall recovery and short and long term outcomes. There is an excellent chance of procedural success. He understands the risks include but are not limited to death, stroke, MI, DVT/PE, bleeding, possible need for transfusion, infections, other organ system dysfunction including respiratory, renal, or GI complications. He  understands and accepts these risks and agrees to proceed.  Anticipate surgery next week.  Guthrie Lemme C 03/19/2011, 4:20 PM

## 2011-03-19 NOTE — Consult Note (Signed)
Pt is a 1 ppd smoker who would like to quit and needs help. Recommended 21 mg patch x 6 weeks, 14 mg patch x 2 weeks and the 7 mg patch x 2 weeks. Discussed patch use instructions and how to taper. Referred to 1-800 quit now for f/u and support. Discussed oral fixation substitutes, second hand smoke and in home smoking policy. Reviewed and gave pt Written education/contact information.

## 2011-03-19 NOTE — Op Note (Signed)
    Cardiac Cath Note  KYMARI NUON [id number] August 10, 1940  Procedure: Left Heart Cardiac Catheterization Note Indications: Unstable angina.  Procedure Details Consent: Obtained Time Out: Verified patient identification, verified procedure, site/side was marked, verified correct patient position, special equipment/implants available, Radiology Safety Procedures followed,  medications/allergies/relevent history reviewed, required imaging and test results available.  Performed   Medications: Fentanyl: 50 mcg IV Versed: 3 mg IV  The right femoral artery was easily canulated using a modified Seldinger technique.  Hemodynamics:    LV pressure: 133/16 Aortic pressure: 131/70  Angiography   Left Main: The left main is calcified. No discrete stenoses.  Left anterior Descending: The left anterior descending artery is a fairly large vessel. It was difficult to lay the LAD out so that we could see it nicely. The proximal LAD has a irregular, acentric 80-85% stenosis. This is followed by a small aneurysmal area. Remaining LAD has minor little regular shoes. The first diagonal artery has sequential moderate to severe stenosis between 60 and 70%. The LAD and diagonal system are mildly to moderately calcified.  Left Circumflex: The left circumflex is a very large system and is dominant. It is fairly normal in the proximal segment. Then divides into a large marginal branch. The inferior most branch from this first obtuse marginal has a tight subtotal stenosis and fills with TIMI grade 2 flow.  The continuation circumflex branch is fairly normal and supplies a moderate-sized posterior descending artery.  Right Coronary Artery: Right coronary artery is small to moderate size. It is occluded proximally and has formed a very large collateral branch. The distal right coronary artery fills via this right right lateral branch. It does not supply any significant amount of the left ventricle.  LV  Gram: The left ventriculogram was performed in the 30 RAO position. It reveals normal left ventricle systolic function. Ejection fraction is a proximal and a 60-65%.  Complications: No apparent complications Patient did tolerate procedure well.  Conclusions:   Severe native coronary artery disease primarily involving the LAD and circumflex vessel. The proximal LAD is very tight but is quite unfavorable for PCI. It is on a moderately sharp band and really cannot be viewed all that well.  Is followed by an aneurysmal segment. We were never able to see the proximal aspect of the stenosis in its quite likely that it goes  back to the left main. I do not think that it is a suitable target for PCI. We will refer him to the cardiovascular thoracic surgeons for consideration for bypass grafting.  His circumflex marginal branch is extremely tight and should be a good target for CABG.  His right coronary artery is occluded but fills via collaterals and is actually very small and nondominant. I do not think that this vessel will need to be bypassed.   He has normal left ventricle systolic function.  Vesta Mixer, Montez Hageman., MD, [facility] 03/19/2011, 11:09 AM

## 2011-03-19 NOTE — Progress Notes (Signed)
Nursing Note: Pt is resting in bed with no complaints. Right groin site non tender, no bleeding, no swelling and no hematoma. Will continue to monitor pt. Artrell Lawless Scientist, clinical (histocompatibility and immunogenetics).

## 2011-03-19 NOTE — Progress Notes (Signed)
Admitted pt to rm [id number] from ED via stretcher, alert and oriented, denies pain at this time, oriented to room, call bell placed within reach, admission assessment done, SR on heart monitor, orders carried out. Will continue to monitor.  Filed Vitals:   03/18/11 2107  BP: 113/67  Pulse: 73  Temp: 98.4 F (36.9 C)  Resp: 18   Alaina Donati, [address]

## 2011-03-19 NOTE — Progress Notes (Signed)
Nursing Note: Pt to room from cath lab. Pt is stable with stable vital signs. Pt's right groin non tender with no hematomas, level 0. Pt educated on bedrest and importance of not bending leg. Pt verbalizes understanding. Will continue to monitor pt. Ociel Retherford Scientist, clinical (histocompatibility and immunogenetics).

## 2011-03-19 NOTE — Progress Notes (Addendum)
Subjective: Patient seen and examined, denies any chest pain or shortness of breath.  Objective: Vital signs in last 24 hours: Temp:  [97.9 F (36.6 C)-98.4 F (36.9 C)] 97.9 F (36.6 C) (03/07 0930) Pulse Rate:  [57-93] 76  (03/07 1500) Resp:  [13-22] 20  (03/07 1500) BP: (106-170)/(64-99) 116/65 mmHg (03/07 1500) SpO2:  [94 %-100 %] 96 % (03/07 0930) Weight:  [90.084 kg (198 lb 9.6 oz)-90.538 kg (199 lb 9.6 oz)] 90.538 kg (199 lb 9.6 oz) (03/07 0628) Weight change:  Last BM Date: 03/18/11  Intake/Output from previous day: 03/06 0701 - 03/07 0700 In: 850 [P.O.:240; I.V.:610] Out: 125 [Urine:125] Total I/O In: 604.2 [I.V.:604.2] Out: 250 [Urine:250]   Physical Exam: General: Alert, awake, oriented x3, in no acute distress. Heart: Regular rate and rhythm, without murmurs, rubs, gallops. Lungs: Clear to auscultation bilaterally. Abdomen: Soft, nontender, nondistended, positive bowel sounds. Extremities: No clubbing cyanosis or edema with positive pedal pulses. Neuro: Grossly intact, nonfocal.    Lab Results: Results for orders placed during the hospital encounter of 03/18/11 (from the past 24 hour(s))  CBC     Status: Normal   Collection Time   03/18/11  5:49 PM      Component Value Range   WBC 8.5  4.0 - 10.5 (K/uL)   RBC 4.84  4.22 - 5.81 (MIL/uL)   Hemoglobin 16.3  13.0 - 17.0 (g/dL)   HCT 16.1  09.6 - 04.5 (%)   MCV 94.6  78.0 - 100.0 (fL)   MCH 33.7  26.0 - 34.0 (pg)   MCHC 35.6  30.0 - 36.0 (g/dL)   RDW 40.9  81.1 - 91.4 (%)   Platelets 179  150 - 400 (K/uL)  DIFFERENTIAL     Status: Normal   Collection Time   03/18/11  5:49 PM      Component Value Range   Neutrophils Relative 58  43 - 77 (%)   Neutro Abs 4.9  1.7 - 7.7 (K/uL)   Lymphocytes Relative 32  12 - 46 (%)   Lymphs Abs 2.7  0.7 - 4.0 (K/uL)   Monocytes Relative 9  3 - 12 (%)   Monocytes Absolute 0.7  0.1 - 1.0 (K/uL)   Eosinophils Relative 2  0 - 5 (%)   Eosinophils Absolute 0.1  0.0 - 0.7 (K/uL)    Basophils Relative 0  0 - 1 (%)   Basophils Absolute 0.0  0.0 - 0.1 (K/uL)  COMPREHENSIVE METABOLIC PANEL     Status: Abnormal   Collection Time   03/18/11  5:49 PM      Component Value Range   Sodium 139  135 - 145 (mEq/L)   Potassium 4.1  3.5 - 5.1 (mEq/L)   Chloride 106  96 - 112 (mEq/L)   CO2 23  19 - 32 (mEq/L)   Glucose, Bld 88  70 - 99 (mg/dL)   BUN 11  6 - 23 (mg/dL)   Creatinine, Ser 7.82  0.50 - 1.35 (mg/dL)   Calcium 9.8  8.4 - 95.6 (mg/dL)   Total Protein 7.5  6.0 - 8.3 (g/dL)   Albumin 3.8  3.5 - 5.2 (g/dL)   AST 18  0 - 37 (U/L)   ALT 19  0 - 53 (U/L)   Alkaline Phosphatase 102  39 - 117 (U/L)   Total Bilirubin 0.5  0.3 - 1.2 (mg/dL)   GFR calc non Af Amer 64 (*) >90 (mL/min)   GFR calc Af Amer 74 (*) >90 (  mL/min)  LIPASE, BLOOD     Status: Normal   Collection Time   03/18/11  5:49 PM      Component Value Range   Lipase 31  11 - 59 (U/L)  TROPONIN I     Status: Normal   Collection Time   03/18/11  5:49 PM      Component Value Range   Troponin I <0.30  <0.30 (ng/mL)  URINALYSIS, ROUTINE W REFLEX MICROSCOPIC     Status: Abnormal   Collection Time   03/18/11  6:24 PM      Component Value Range   Color, Urine YELLOW  YELLOW    APPearance CLEAR  CLEAR    Specific Gravity, Urine 1.012  1.005 - 1.030    pH 7.5  5.0 - 8.0    Glucose, UA NEGATIVE  NEGATIVE (mg/dL)   Hgb urine dipstick NEGATIVE  NEGATIVE    Bilirubin Urine NEGATIVE  NEGATIVE    Ketones, ur 15 (*) NEGATIVE (mg/dL)   Protein, ur NEGATIVE  NEGATIVE (mg/dL)   Urobilinogen, UA 0.2  0.0 - 1.0 (mg/dL)   Nitrite NEGATIVE  NEGATIVE    Leukocytes, UA SMALL (*) NEGATIVE   URINE MICROSCOPIC-ADD ON     Status: Normal   Collection Time   03/18/11  6:24 PM      Component Value Range   Squamous Epithelial / LPF RARE  RARE    WBC, UA 3-6  <3 (WBC/hpf)   Bacteria, UA RARE  RARE    Urine-Other AMORPHOUS URATES/PHOSPHATES    CARDIAC PANEL(CRET KIN+CKTOT+MB+TROPI)     Status: Normal   Collection Time   03/18/11 10:00  PM      Component Value Range   Total CK 36  7 - 232 (U/L)   CK, MB 1.3  0.3 - 4.0 (ng/mL)   Troponin I <0.30  <0.30 (ng/mL)   Relative Index RELATIVE INDEX IS INVALID  0.0 - 2.5   CBC     Status: Normal   Collection Time   03/18/11 10:00 PM      Component Value Range   WBC 8.2  4.0 - 10.5 (K/uL)   RBC 4.43  4.22 - 5.81 (MIL/uL)   Hemoglobin 14.7  13.0 - 17.0 (g/dL)   HCT 16.1  09.6 - 04.5 (%)   MCV 96.4  78.0 - 100.0 (fL)   MCH 33.2  26.0 - 34.0 (pg)   MCHC 34.4  30.0 - 36.0 (g/dL)   RDW 40.9  81.1 - 91.4 (%)   Platelets 181  150 - 400 (K/uL)  CREATININE, SERUM     Status: Abnormal   Collection Time   03/18/11 10:00 PM      Component Value Range   Creatinine, Ser 1.24  0.50 - 1.35 (mg/dL)   GFR calc non Af Amer 58 (*) >90 (mL/min)   GFR calc Af Amer 67 (*) >90 (mL/min)  CARDIAC PANEL(CRET KIN+CKTOT+MB+TROPI)     Status: Normal   Collection Time   03/19/11  5:57 AM      Component Value Range   Total CK 35  7 - 232 (U/L)   CK, MB 1.4  0.3 - 4.0 (ng/mL)   Troponin I <0.30  <0.30 (ng/mL)   Relative Index RELATIVE INDEX IS INVALID  0.0 - 2.5   COMPREHENSIVE METABOLIC PANEL     Status: Abnormal   Collection Time   03/19/11  5:57 AM      Component Value Range   Sodium 144  135 -  145 (mEq/L)   Potassium 4.3  3.5 - 5.1 (mEq/L)   Chloride 111  96 - 112 (mEq/L)   CO2 26  19 - 32 (mEq/L)   Glucose, Bld 92  70 - 99 (mg/dL)   BUN 13  6 - 23 (mg/dL)   Creatinine, Ser 9.14  0.50 - 1.35 (mg/dL)   Calcium 9.1  8.4 - 78.2 (mg/dL)   Total Protein 6.1  6.0 - 8.3 (g/dL)   Albumin 3.1 (*) 3.5 - 5.2 (g/dL)   AST 15  0 - 37 (U/L)   ALT 14  0 - 53 (U/L)   Alkaline Phosphatase 82  39 - 117 (U/L)   Total Bilirubin 0.2 (*) 0.3 - 1.2 (mg/dL)   GFR calc non Af Amer 54 (*) >90 (mL/min)   GFR calc Af Amer 63 (*) >90 (mL/min)  CBC     Status: Abnormal   Collection Time   03/19/11  5:57 AM      Component Value Range   WBC 7.5  4.0 - 10.5 (K/uL)   RBC 4.08 (*) 4.22 - 5.81 (MIL/uL)   Hemoglobin 13.3   13.0 - 17.0 (g/dL)   HCT 95.6  21.3 - 08.6 (%)   MCV 96.3  78.0 - 100.0 (fL)   MCH 32.6  26.0 - 34.0 (pg)   MCHC 33.8  30.0 - 36.0 (g/dL)   RDW 57.8  46.9 - 62.9 (%)   Platelets 184  150 - 400 (K/uL)  TSH     Status: Normal   Collection Time   03/19/11  5:57 AM      Component Value Range   TSH 1.575  0.350 - 4.500 (uIU/mL)  LIPID PANEL     Status: Abnormal   Collection Time   03/19/11  5:57 AM      Component Value Range   Cholesterol 125  0 - 200 (mg/dL)   Triglycerides 528 (*) <150 (mg/dL)   HDL 24 (*) >41 (mg/dL)   Total CHOL/HDL Ratio 5.2     VLDL 31  0 - 40 (mg/dL)   LDL Cholesterol 70  0 - 99 (mg/dL)  PROTIME-INR     Status: Normal   Collection Time   03/19/11  8:40 AM      Component Value Range   Prothrombin Time 14.6  11.6 - 15.2 (seconds)   INR 1.12  0.00 - 1.49   CARDIAC PANEL(CRET KIN+CKTOT+MB+TROPI)     Status: Normal   Collection Time   03/19/11  2:10 PM      Component Value Range   Total CK 46  7 - 232 (U/L)   CK, MB 1.5  0.3 - 4.0 (ng/mL)   Troponin I <0.30  <0.30 (ng/mL)   Relative Index RELATIVE INDEX IS INVALID  0.0 - 2.5     Studies/Results: Dg Chest 2 View  03/18/2011  *RADIOLOGY REPORT*  Clinical Data: Chest pain.  Bilateral arm numbness.  Diaphoresis.  CHEST - 2 VIEW  Comparison: 11/14/2008 and 05/28/2003  Findings: The heart size and vascularity are normal and the lungs are clear.  No acute osseous abnormality.  Slight mid thoracic scoliosis.  IMPRESSION: No acute abnormalities.  Original Report Authenticated By: Gwynn Burly, M.D.    Medications:    . amLODipine  10 mg Oral Daily  . aspirin  324 mg Oral Pre-Cath  . aspirin  325 mg Oral Daily  . atorvastatin  40 mg Oral q1800  . fentaNYL      . heparin      .  lidocaine      . LORazepam  0.5 mg Oral BID  . metoprolol tartrate  37.5 mg Oral BID  . midazolam      . midazolam      . nitroGLYCERIN  0.4 mg Transdermal Daily  . nitroGLYCERIN      . pantoprazole  40 mg Oral Q1200  . sodium chloride   3 mL Intravenous Q12H  . DISCONTD: aspirin EC  325 mg Oral Daily  . DISCONTD: enoxaparin  40 mg Subcutaneous Q24H  . DISCONTD: simvastatin  20 mg Oral q1800    acetaminophen, acetaminophen, ondansetron (ZOFRAN) IV, ondansetron, DISCONTD: acetaminophen, DISCONTD: ondansetron (ZOFRAN) IV     . sodium chloride 125 mL/hr at 03/19/11 1215  . DISCONTD: sodium chloride 1,000 mL (03/18/11 1802)  . DISCONTD: sodium chloride 75 mL/hr at 03/18/11 2252  . DISCONTD: sodium chloride 100 mL/hr at 03/19/11 0810    Assessment/Plan:  #1. Chest pain : Status post cardiac catheterization that showed severe native coronary artery disease involving LAD and circumflex vessel  unfavorable for PCI., cardio thoracic surgery was consulted by cardiology for consideration for bypass grafting.Continue nitroglycerin paste , statins and aspirin.   #2. Hypertension -controlled continue present medications  #3. Tobacco abuse -counseled on  Tobacco cessation. #4. Hyperlipidemia - continue statins . #5. History of carotid endarterectomy     LOS: 1 day   [name] 03/19/2011, 3:14 PM

## 2011-03-19 NOTE — ED Provider Notes (Signed)
History     CSN: [id number]  Arrival date & time 03/18/11  1731   First MD Initiated Contact with Patient 03/18/11 1738      Chief Complaint  Patient presents with  . Chest Pain    HPI  Pt was seen at 1740. Per pt, c/o gradual onset and worsening of multiple intermittent episodes of mid-sternal chest "pain" for the past week. Pt states the pain became worse today. Has been assoc with SOB. Lasts approx 10 min each episode before resolving. Pt took ASA 325mg  PTA. Pt denies symptoms currently. Denies palpitations, no cough, no back pain, no abd pain, no N/V/D, no fevers.     Past Medical History  Diagnosis Date  . Carotid arterial disease     s/p RCEA 11/2008 (Note: hx of negative cardiac nuc 03/2009)  . Hypertension   . Colonic polyp     cscopes 08/18/05 tics,polyps.Marland Kitchenall hyperplastic  . GERD (gastroesophageal reflux disease)     Barrets Esop. (per bx 08/2005) and HH f/u Dr.Schooler  . Anal warts   . Anal fissure   . Hemorrhoids   . 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 Panic attack   . Osteoarthritis   . Depression   . H/O ETOH abuse     Former. Quit ~2012    Past Surgical History  Procedure Date  . Right carotid endartectomy with dacron patch angioplasty 11/2008  . Appendectomy   . Knee surgery   . Foot surgery     fx  . Total hip arthroplasty 2005    R due to OA  . Skin tags excised   . Transurethral resection of prostate   . Cataract extraction     wears contact lens L, good vision    Family History  Problem Relation Age of Onset  . Lung cancer Father     mets to brain- deceased  . Arthritis Mother   . Hypertension Mother   . Coronary artery disease Neg Hx   . Colon cancer Neg Hx   . Prostate cancer Neg Hx   . Diabetes Paternal Aunt   . Stroke Paternal Grandfather     History  Substance Use Topics  . Smoking status: Current Everyday Smoker -- 1.0 packs/day for 52 years    Types: Cigarettes  . Smokeless tobacco: Never  Used  . Alcohol Use: No     H/o ETOH abuse (fifth of sherry daily), currently not drinking    Review of Systems ROS: Statement: All systems negative except as marked or noted in the HPI; Constitutional: Negative for fever and chills. ; ; Eyes: Negative for eye pain, redness and discharge. ; ; ENMT: Negative for ear pain, hoarseness, nasal congestion, sinus pressure and sore throat. ; ; Cardiovascular: +CP, SOB. Negative for palpitations, diaphoresis, and peripheral edema. ; ; Respiratory: Negative for cough, wheezing and stridor. ; ; Gastrointestinal: Negative for nausea, vomiting, diarrhea, abdominal pain, blood in stool, hematemesis, jaundice and rectal bleeding. . ; ; Genitourinary: Negative for dysuria, flank pain and hematuria. ; ; Musculoskeletal: Negative for back pain and neck pain. Negative for swelling and trauma.; ; Skin: Negative for pruritus, rash, abrasions, blisters, bruising and skin lesion.; ; Neuro: Negative for headache, lightheadedness and neck stiffness. Negative for weakness, altered level of consciousness , altered mental status, extremity weakness, paresthesias, involuntary movement, seizure and syncope.    Allergies  Sertraline hcl  Home Medications  No current outpatient prescriptions on  file.  BP 116/65  Pulse 76  Temp(Src) 97.9 F (36.6 C) (Oral)  Resp 20  Ht 6' (1.829 m)  Wt 199 lb 9.6 oz (90.538 kg)  BMI 27.07 kg/m2  SpO2 96%  Physical Exam 1745:  Physical examination: Nursing notes reviewed; Vital signs and O2 SAT reviewed; Constitutional: Well developed, Well nourished, Well hydrated, In no acute distress; Head: Normocephalic, atraumatic; Eyes: EOMI, PERRL, No scleral icterus; ENMT: Mouth and pharynx normal, Mucous membranes moist; Neck: Supple, Full range of motion, No lymphadenopathy; Cardiovascular: Regular rate and rhythm, No murmur, rub, or gallop; Respiratory: Breath sounds clear & equal bilaterally, No rales, rhonchi, wheezes, or rub, Normal  respiratory effort/excursion; Chest: Nontender, Movement normal; Abdomen: Soft, Nontender, Nondistended, Normal bowel sounds; Extremities: Pulses normal, No tenderness, No edema, No calf edema or asymmetry.; Neuro: AA&Ox3, Major CN grossly intact. No gross focal motor or sensory deficits in extremities.; Skin: Color normal, Warm, Dry.    ED Course  Procedures   MDM  MDM Reviewed: nursing note and vitals Reviewed previous: ECG Interpretation: ECG, labs and x-ray   Date: 03/18/2011  Rate: 73  Rhythm: normal sinus rhythm  QRS Axis: normal  Intervals: normal  ST/T Wave abnormalities: nonspecific T wave changes  Conduction Disutrbances:nonspecific intraventricular conduction delay  Narrative Interpretation:  Old EKG Reviewed: changes noted; IVCD and flipped T-wave lead avL compared to previous EKG dated 11/15/2008.    Results for orders placed during the hospital encounter of 03/18/11   CBC   Component  Value  Range    WBC  8.5  4.0 - 10.5 (K/uL)    RBC  4.84  4.22 - 5.81 (MIL/uL)    Hemoglobin  16.3  13.0 - 17.0 (g/dL)    HCT  96.0  45.4 - 09.8 (%)    MCV  94.6  78.0 - 100.0 (fL)    MCH  33.7  26.0 - 34.0 (pg)    MCHC  35.6  30.0 - 36.0 (g/dL)    RDW  11.9  14.7 - 82.9 (%)    Platelets  179  150 - 400 (K/uL)   DIFFERENTIAL   Component  Value  Range    Neutrophils Relative  58  43 - 77 (%)    Neutro Abs  4.9  1.7 - 7.7 (K/uL)    Lymphocytes Relative  32  12 - 46 (%)    Lymphs Abs  2.7  0.7 - 4.0 (K/uL)    Monocytes Relative  9  3 - 12 (%)    Monocytes Absolute  0.7  0.1 - 1.0 (K/uL)    Eosinophils Relative  2  0 - 5 (%)    Eosinophils Absolute  0.1  0.0 - 0.7 (K/uL)    Basophils Relative  0  0 - 1 (%)    Basophils Absolute  0.0  0.0 - 0.1 (K/uL)   COMPREHENSIVE METABOLIC PANEL   Component  Value  Range    Sodium  139  135 - 145 (mEq/L)    Potassium  4.1  3.5 - 5.1 (mEq/L)    Chloride  106  96 - 112 (mEq/L)    CO2  23  19 - 32 (mEq/L)    Glucose, Bld  88  70 - 99  (mg/dL)    BUN  11  6 - 23 (mg/dL)    Creatinine, Ser  5.62  0.50 - 1.35 (mg/dL)    Calcium  9.8  8.4 - 10.5 (mg/dL)    Total Protein  7.5  6.0 - 8.3 (g/dL)    Albumin  3.8  3.5 - 5.2 (g/dL)    AST  18  0 - 37 (U/L)    ALT  19  0 - 53 (U/L)    Alkaline Phosphatase  102  39 - 117 (U/L)    Total Bilirubin  0.5  0.3 - 1.2 (mg/dL)    GFR calc non Af Amer  64 (*)  >90 (mL/min)    GFR calc Af Amer  74 (*)  >90 (mL/min)   LIPASE, BLOOD   Component  Value  Range    Lipase  31  11 - 59 (U/L)   URINALYSIS, ROUTINE W REFLEX MICROSCOPIC   Component  Value  Range    Color, Urine  YELLOW  YELLOW    APPearance  CLEAR  CLEAR    Specific Gravity, Urine  1.012  1.005 - 1.030    pH  7.5  5.0 - 8.0    Glucose, UA  NEGATIVE  NEGATIVE (mg/dL)    Hgb urine dipstick  NEGATIVE  NEGATIVE    Bilirubin Urine  NEGATIVE  NEGATIVE    Ketones, ur  15 (*)  NEGATIVE (mg/dL)    Protein, ur  NEGATIVE  NEGATIVE (mg/dL)    Urobilinogen, UA  0.2  0.0 - 1.0 (mg/dL)    Nitrite  NEGATIVE  NEGATIVE    Leukocytes, UA  SMALL (*)  NEGATIVE   TROPONIN I   Component  Value  Range    Troponin I  <0.30  <0.30 (ng/mL)   URINE MICROSCOPIC-ADD ON   Component  Value  Range    Squamous Epithelial / LPF  RARE  RARE    WBC, UA  3-6  <3 (WBC/hpf)    Bacteria, UA  RARE  RARE    Urine-Other  AMORPHOUS URATES/PHOSPHATES     Dg Chest 2 View  03/18/2011 *RADIOLOGY REPORT* Clinical Data: Chest pain. Bilateral arm numbness. Diaphoresis. CHEST - 2 VIEW Comparison: 11/14/2008 and 05/28/2003 Findings: The heart size and vascularity are normal and the lungs are clear. No acute osseous abnormality. Slight mid thoracic scoliosis. IMPRESSION: No acute abnormalities. Original Report Authenticated By: Gwynn Burly, M.D.     1925: Continues without symptoms. Dx testing d/w pt and family. Questions answered. Verb understanding, agreeable to admit. T/C to [facility]. Toniann Fail, case discussed, including: HPI, pertinent PM/SHx, VS/PE, dx testing,  ED course and treatment: Agreeable to admit, requests to obtain tele bed to team 7.            Laray Anger, DO 03/19/11 [phone]

## 2011-03-19 NOTE — Progress Notes (Signed)
Utilization Review Completed.[name] T3/07/2011

## 2011-03-19 NOTE — Progress Notes (Signed)
Nursing Note: Pt taken to cath lab. Pt had no complaints, vital signs stable: 106/64, 73, 96% RA, 97.9. Pt in cath lab. Breleigh Carpino Scientist, clinical (histocompatibility and immunogenetics).

## 2011-03-19 NOTE — Consult Note (Signed)
CARDIOLOGY CONSULT NOTE  Patient ID: [name], MRN: [id number], DOB/AGE: Mar 04, 1940 71 y.o. Admit date: 03/18/2011 Date of Consult: 03/19/2011  Primary Physician: Willow Ora, MD, MD Primary Cardiologist: Dr. Antoine Poche  Chief Complaint: CP  HPI: 71 y/o M with hx of carotid disease, HTN, Barrett's esophagus, longstanding tobacco use, and prior EtOH abuse presented to [facility] with complaints of chest pain. He was last evaluated by Dr. Antoine Poche in 08/2009, who had seen him for difficult to control HTN - he was instructed to follow up as needed at that time; nuclear stress testing was negative in 03/2009.For the last month he has been having intermittent chest discomfort that he describes as a substernal "burning," associated with bilateral arm numbness and breaking out into a cold sweat. These episodes occur after walking, eating, and even at rest. He does not particularly exert himself so unsure if it's worse with exertion. He has been having 1-3 episodes daily, lasting up to 15 minutes at a time. The pain has been relieved by leaning forward and practicing deep breathing. Time is what usually helps the pain go away. He has also used Zantac/Kaopectate with relief but only after a period of time. He has not had pain like this before. He denies any nausea, vomiting, BRBPR, melena, hematemesis, palpitations or syncope. He does feel dizzy sometimes when he stands up too quickly. Last night the CP was associated with SOB so he came to the ER. Troponins are negative x 2. CXR showed no acute disease, normal heart size. EKG is nonacute. He received aspirin, NTG patch with last pain episode while down in the ER. He is not tachycardic or tachypnic and denies any LEE or orthopnea. He is currently on BB and statin as well as Protonix.  Past Medical History  Diagnosis Date  . Carotid arterial disease     s/p RCEA 11/2008 (Note: hx of negative cardiac nuc 03/2009)  . Hypertension   . Colonic polyp    cscopes 08/18/05 tics,polyps.Marland Kitchenall hyperplastic  . GERD (gastroesophageal reflux disease)     Barrets Esop. (per bx 08/2005) and HH f/u Dr.Schooler  . Anal warts   . Anal fissure   . Hemorrhoids   . 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 Panic attack   . Osteoarthritis   . Depression    Nuclear 03/2009 - No evidence of ischemia or scar. LVEF 61% Last carotid dopplers 12/2009 -  IMPRESSION:  1. Widely patent right CEA.  2. Left internal carotid artery plaquing 1% to 39%.  3. Antegrade vertebral arteries bilaterally.   - patient states he is in the "middle of a cycle" seeing Dr. Edilia Bo and thinks he has f/u in about 6 months  Surgical History:  Past Surgical History  Procedure Date  . Right carotid endartectomy with dacron patch angioplasty 11/2008  . Appendectomy   . Knee surgery   . Foot surgery     fx  . Total hip arthroplasty 2005    R due to OA  . Skin tags excised   . Transurethral resection of prostate   . Cataract extraction     wears contact lens L, good vision     Home Meds: Medication Sig  amLODipine (NORVASC) 10 MG tablet Take 10 mg by mouth daily.  aspirin 325 MG tablet Take 325 mg by mouth daily.    docusate sodium (COLACE) 100 MG capsule Take 100 mg by mouth 2 (two)  times daily.  LORazepam (ATIVAN) 0.5 MG tablet Take 0.5 mg by mouth 2 (two) times daily.  metoprolol tartrate (LOPRESSOR) 25 MG tablet Take 37.5 mg by mouth 2 (two) times daily.  omeprazole (PRILOSEC) 40 MG capsule Take 40 mg by mouth daily.  pravastatin (PRAVACHOL) 40 MG tablet Take 40 mg by mouth daily.    Inpatient Medications:    . amLODipine  10 mg Oral Daily  . aspirin  325 mg Oral Daily  . enoxaparin  40 mg Subcutaneous Q24H  . LORazepam  0.5 mg Oral BID  . metoprolol tartrate  37.5 mg Oral BID  . nitroGLYCERIN  0.4 mg Transdermal Daily  . pantoprazole  40 mg Oral Q1200  . simvastatin  20 mg Oral q1800  . sodium chloride  3 mL Intravenous Q12H  .  DISCONTD: aspirin EC  325 mg Oral Daily    Allergies:  Allergies  Allergen Reactions  . Sertraline Hcl     REACTION: tongue bleed    History   Social History  . Marital Status: Married    Spouse Name: N/A    Number of Children: N/A  . Years of Education: N/A   Occupational History  . Not on file.   Social History Main Topics  . Smoking status: Current Everyday Smoker -- 1.0 packs/day for 52 years    Types: Cigarettes  . Smokeless tobacco: Never Used  . 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 Drug Use: No  . Sexually Active: No   Other Topics Concern  . Not on file   Social History Narrative   Mother lives with him----     He does not seem interested in quitting tobacco - he states he's tried with no success.  Family History  Problem Relation Age of Onset  . Lung cancer Father     mets to brain- deceased  . Arthritis Mother   . Hypertension Mother   . Coronary artery disease Neg Hx   . Colon cancer Neg Hx   . Prostate cancer Neg Hx   . Diabetes Paternal Aunt   . Stroke Paternal Grandfather      Review of Systems: General: negative for chills, fever, night sweats or weight changes.  Cardiovascular: negative for edema, orthopnea, palpitations, paroxysmal nocturnal dyspnea Dermatological: negative for rash Respiratory: negative for cough or wheezing Urologic: negative for hematuria Abdominal: negative for nausea, vomiting, diarrhea, bright red blood per rectum, melena, or hematemesis Neurologic: negative for visual changes, syncope, or dizziness All other systems reviewed and are otherwise negative except as noted above.  Labs:  [facility] 03/18/11 2200 03/18/11 1749  CKTOTAL 36 --  CKMB 1.3 --  TROPONINI <0.30 <0.30   Lab Results  Component Value Date   WBC 7.5 03/19/2011   HGB 13.3 03/19/2011   HCT 39.3 03/19/2011   MCV 96.3 03/19/2011   PLT 184 03/19/2011    Lab 03/18/11 2200 03/18/11 1749  NA -- 139  K -- 4.1  CL -- 106  CO2 -- 23    BUN -- 11  CREATININE 1.24 --  CALCIUM -- 9.8  PROT -- 7.5  BILITOT -- 0.5  ALKPHOS -- 102  ALT -- 19  AST -- 18  GLUCOSE -- 88   Lab Results  Component Value Date   CHOL 133 05/26/2010   HDL 42.40 05/26/2010   LDLCALC 73 05/26/2010   TRIG 90.0 05/26/2010   Radiology/Studies:  1. Chest 2 View 03/18/2011  *  RADIOLOGY REPORT*  Clinical Data: Chest pain.  Bilateral arm numbness.  Diaphoresis.  CHEST - 2 VIEW  Comparison: 11/14/2008 and 05/28/2003  Findings: The heart size and vascularity are normal and the lungs are clear.  No acute osseous abnormality.  Slight mid thoracic scoliosis.  IMPRESSION: No acute abnormalities.  Original Report Authenticated By: Gwynn Burly, M.D.    EKG: NSR 73bpm TWI avL grossly unchanged from prior Telemetry: episode of ventricular trigeminy yesterday  Physical Exam: Blood pressure 107/67, pulse 57, temperature 98 F (36.7 C), temperature source Oral, resp. rate 18, height 6' (1.829 m), weight 199 lb 9.6 oz (90.538 kg), SpO2 95.00%. General: Well developed, well nourished WM in no acute distress. Head: Normocephalic, atraumatic, sclera non-icteric, no xanthomas, nares are without discharge.  Neck: Negative for carotid bruits. JVD not elevated. Lungs: Clear bilaterally to auscultation without wheezes, rales, or rhonchi. Breathing is unlabored. Heart: Distant heart sounds, RRR with S1 S2. No murmurs, rubs, or gallops appreciated. Abdomen: Soft, non-tender, non-distended with normoactive bowel sounds. No hepatomegaly. No rebound/guarding. No obvious abdominal masses. Msk:  Strength and tone appear normal for age. Extremities: No clubbing or cyanosis. No edema.  Distal pedal pulses are 1+ and equal bilaterally. Neuro: Alert and oriented X 3. Moves all extremities spontaneously. Psych:  Responds to questions appropriately with a normal affect.    Assessment and Plan:   1. Chest pain concerning for [place] - with typical and atypical features in the setting of  known PVD/tobacco use/HTN - [name]'s chest pain sounds concerning for unstable angina, especially in the setting of known carotid disease, longstanding tobacco, and HTN. EKG only nonspecific TWI, no acute ST elevation. We will discontinue his DVT ppx Lovenox in preparation for left heart catheterization today. Continue ASA, BB. See below re: statin.  2. Hx of carotid artery disease - encouraged continued outpatient f/u with Dr. Edilia Bo.  3. HTN - BP currently controlled. Was elevated when he came in; will need to trend.  4. Longstanding tobacco use, counseled regarding cessation  5. Former alcohol abuse - congratulated pt on quitting, encouraged continued abstinence.  6. Hyperlipidemia  - ,Will switch Zocor to Lipitor in light of concurrent amlodipine.    Signed, Dayna Dunn PA-C 03/19/2011, 6:55 AM  Agree with above assessment.  Physical exam unremarkable except for old right carotid endarterectomy scar. Good pedal pulses and good radial pulses with right radial stronger than left. Agree cardiac cath appropriate next step.  Will proceed this am. The risks and benefits of a cardiac catheterization including, but not limited to, death, stroke, MI, kidney damage and bleeding were discussed with the patient who indicates understanding and agrees to proceed.

## 2011-03-19 NOTE — Progress Notes (Signed)
  Echocardiogram 2D Echocardiogram has been performed.  Juanita Laster Kevona Lupinacci 03/19/2011, 5:15 PM

## 2011-03-19 NOTE — Interval H&P Note (Signed)
History and Physical Interval Note:  03/19/2011 10:24 AM  [name]  has presented today for surgery, with the diagnosis of chest pain  The various methods of treatment have been discussed with the patient and family. After consideration of risks, benefits and other options for treatment, the patient has consented to  Procedure(s) (LRB): LEFT HEART CATHETERIZATION WITH CORONARY ANGIOGRAM (N/A) as a surgical intervention .  The patients' history has been reviewed, patient examined, no change in status, stable for surgery.  I have reviewed the patients' chart and labs.  Questions were answered to the patient's satisfaction.    Talked with patient about cath.  He presents with several episodes of CP.  Will proceed with cath.  Elyn Aquas.

## 2011-03-20 ENCOUNTER — Observation Stay (HOSPITAL_COMMUNITY): Payer: Medicare PPO

## 2011-03-20 DIAGNOSIS — R079 Chest pain, unspecified: Secondary | ICD-10-CM

## 2011-03-20 DIAGNOSIS — Z01810 Encounter for preprocedural cardiovascular examination: Secondary | ICD-10-CM

## 2011-03-20 MED ORDER — ALPRAZOLAM 0.25 MG PO TABS: 0.2500 mg | ORAL_TABLET | ORAL | Status: DC | PRN

## 2011-03-20 NOTE — Progress Notes (Signed)
1 Day Post-Op Procedure(s) (LRB): LEFT HEART CATHETERIZATION WITH CORONARY ANGIOGRAM (N/A) Subjective: Feels well this afternoon. Had an episode of dizziness and disorientation during PFTs this AM Denies CP  Objective: Vital signs in last 24 hours: Temp:  [98.1 F (36.7 C)-99.6 F (37.6 C)] 98.9 F (37.2 C) (03/08 0516) Pulse Rate:  [74-87] 74  (03/08 0943) Cardiac Rhythm:  [-] Normal sinus rhythm (03/08 1057) Resp:  [18-20] 18  (03/08 0516) BP: (117-155)/(67-91) 152/91 mmHg (03/08 0516) SpO2:  [96 %-97 %] 96 % (03/08 0516) Weight:  [195 lb 3.2 oz (88.542 kg)] 195 lb 3.2 oz (88.542 kg) (03/08 0516)  Hemodynamic parameters for last 24 hours:    Intake/Output from previous day: 03/07 0701 - 03/08 0700 In: 1227.1 [P.O.:600; I.V.:627.1] Out: 2025 [Urine:2025] Intake/Output this shift: Total I/O In: 450 [P.O.:450] Out: 375 [Urine:375]  General appearance: alert and no distress Neurologic: intact Heart: regular rate and rhythm  Lab Results:  Basename 03/19/11 0557 03/18/11 2200  WBC 7.5 8.2  HGB 13.3 14.7  HCT 39.3 42.7  PLT 184 181   BMET:  Basename 03/19/11 0557 03/18/11 2200 03/18/11 1749  NA 144 -- 139  K 4.3 -- 4.1  CL 111 -- 106  CO2 26 -- 23  GLUCOSE 92 -- 88  BUN 13 -- 11  CREATININE 1.31 1.24 --  CALCIUM 9.1 -- 9.8    PT/INR:  Basename 03/19/11 0840  LABPROT 14.6  INR 1.12   ABG No results found for this basename: phart, pco2, po2, hco3, tco2, acidbasedef, o2sat   CBG (last 3)  No results found for this basename: GLUCAP:3 in the last 72 hours  Assessment/Plan: S/P Procedure(s) (LRB): LEFT HEART CATHETERIZATION WITH CORONARY ANGIOGRAM (N/A) For CABG Monday 2nd case All questions answered   LOS: 2 days    [name] C 03/20/2011   

## 2011-03-20 NOTE — Progress Notes (Signed)
   SUBJECTIVE:  Chest pain today while at PFTs.  Pain free now.   PHYSICAL EXAM Filed Vitals:   03/19/11 2100 03/19/11 2313 03/20/11 0516 03/20/11 0943  BP: 155/74 117/67 152/91   Pulse: 87 80 76 74  Temp: 99.6 F (37.6 C) 98.1 F (36.7 C) 98.9 F (37.2 C)   TempSrc: Oral Oral Oral   Resp: 18 20 18    Height:      Weight:   88.542 kg (195 lb 3.2 oz)   SpO2: 97% 96% 96%    General:  No distress Lungs:  Clear Heart:  RRR, no rub, no murmur Abdomen:  Positive bowel sounds, no rebound no guarding. Extremities:  Right groin without hematoma or bruit.  No pulsatile mass.  LABS: Lab Results  Component Value Date   CKTOTAL 46 03/19/2011   CKMB 1.5 03/19/2011   TROPONINI <0.30 03/19/2011   No results found for this or any previous visit (from the past 24 hour(s)).  Intake/Output Summary (Last 24 hours) at 03/20/11 1751 Last data filed at 03/20/11 1733  Gross per 24 hour  Intake   1050 ml  Output   2150 ml  Net  -1100 ml   ASSESSMENT AND PLAN:   1)  Chest pain:  Cath yesterday with high grade LAD stenosis not favorable for PCI.  Plan CABG Monday.  I have reviewed the cath and agree with the plan.   2)  TOBACCO ABUSE:  Educated   3)  HYPERTENSION:  Continue current meds.   4)  Dyslipidemia:  LDL 70 on current meds.  Continue.   5)  Carotid Doppler:  Follow up Dopplers pending.       Rollene Rotunda 03/20/2011 5:51 PM

## 2011-03-20 NOTE — Progress Notes (Signed)
Nursing Note: Pt went down for PFT's. Pt had a episode, pt complained of dizziness, sob, and lightheadedness. Nurse made aware and went to transfer pt from PFT lab back to room 4733-01. Pt is now in bed oxygen saturation 97% on room air and no complaints of dizziness or lightheadedness. Pt educated to call nurse when need to ambulate, call bell within reach. Pt alert and oriented and verbalizes understanding. Will continue to monitor pt. Perlie Stene Scientist, clinical (histocompatibility and immunogenetics).

## 2011-03-20 NOTE — Progress Notes (Signed)
Pre-op Cardiac Surgery  Carotid Findings:  Patent right endarterectomy.  No significant ICA stenosis noted on left.  Upper Extremity Right Left  Brachial Pressures 132  triphasic 146  triphasic  Radial Waveforms triphasic triphasic  Ulnar Waveforms triphasic triphasic  Palmar Arch (Allen's Test) Normal with radial compression and decreases greater that 50 % with ulnar compression Normal with radial and ulnar compression   Findings:      Lower  Extremity Right Left  Dorsalis Pedis    Anterior Tibial    Posterior Tibial    Ankle/Brachial Indices      Findings:     Palpable pedal pulses X 4  Vanna Scotland 03/20/2011 8:16 PM

## 2011-03-20 NOTE — Progress Notes (Signed)
Patient ID: [name]  male  LKG:[id number]    DOB: 02-Feb-1940    DOA: 03/18/2011  PCP: Willow Ora, MD, MD  Subjective: No complaints  Objective: Weight change: -1.542 kg (-3 lb 6.4 oz)  Intake/Output Summary (Last 24 hours) at 03/20/11 1238 Last data filed at 03/20/11 1000  Gross per 24 hour  Intake 1153.75 ml  Output   2300 ml  Net -1146.25 ml   Blood pressure 152/91, pulse 74, temperature 98.9 F (37.2 C), temperature source Oral, resp. rate 18, height 6' (1.829 m), weight 88.542 kg (195 lb 3.2 oz), SpO2 96.00%.  Physical Exam: General: Alert and awake, oriented x3, not in any acute distress. HEENT: anicteric sclera, pupils reactive to light and accommodation, EOMI CVS: S1-S2 clear, no murmur rubs or gallops Chest: clear to auscultation bilaterally, no wheezing, rales or rhonchi Abdomen: soft nontender, nondistended, normal bowel sounds, no organomegaly Extremities: no cyanosis, clubbing or edema noted bilaterally Neuro: Cranial nerves II-XII intact, no focal neurological deficits  Lab Results: Basic Metabolic Panel:  Lab 03/19/11 6644 03/18/11 2200 03/18/11 1749  NA 144 -- 139  K 4.3 -- 4.1  CL 111 -- 106  CO2 26 -- 23  GLUCOSE 92 -- 88  BUN 13 -- 11  CREATININE 1.31 1.24 --  CALCIUM 9.1 -- 9.8  MG -- -- --  PHOS -- -- --   Liver Function Tests:  Lab 03/19/11 0557 03/18/11 1749  AST 15 18  ALT 14 19  ALKPHOS 82 102  BILITOT 0.2* 0.5  PROT 6.1 7.5  ALBUMIN 3.1* 3.8    Lab 03/18/11 1749  LIPASE 31  AMYLASE --   CBC:  Lab 03/19/11 0557 03/18/11 2200 03/18/11 1749  WBC 7.5 8.2 --  NEUTROABS -- -- 4.9  HGB 13.3 14.7 --  HCT 39.3 42.7 --  MCV 96.3 96.4 --  PLT 184 181 --   Cardiac Enzymes:  Lab 03/19/11 1410 03/19/11 0557 03/18/11 2200  CKTOTAL 46 35 36  CKMB 1.5 1.4 1.3  CKMBINDEX -- -- --  TROPONINI <0.30 <0.30 <0.30     Micro Results: Recent Results (from the past 240 hour(s))  URINE CULTURE     Status: Normal   Collection Time   03/18/11  6:24 PM      Component Value Range Status Comment   Specimen Description URINE, CLEAN CATCH   Final    Special Requests NONE   Final    Culture  Setup Time [id number]   Final    Colony Count NO GROWTH   Final    Culture NO GROWTH   Final    Report Status 03/19/2011 FINAL   Final     Studies/Results: Dg Chest 2 View  03/18/2011  *RADIOLOGY REPORT*  Clinical Data: Chest pain.  Bilateral arm numbness.  Diaphoresis.  CHEST - 2 VIEW  Comparison: 11/14/2008 and 05/28/2003  Findings: The heart size and vascularity are normal and the lungs are clear.  No acute osseous abnormality.  Slight mid thoracic scoliosis.  IMPRESSION: No acute abnormalities.  Original Report Authenticated By: Gwynn Burly, M.D.    Medications: Scheduled Meds:   . amLODipine  10 mg Oral Daily  . aspirin  325 mg Oral Daily  . atorvastatin  40 mg Oral q1800  . LORazepam  0.5 mg Oral BID  . metoprolol tartrate  37.5 mg Oral BID  . nitroGLYCERIN  0.4 mg Transdermal Daily  . pantoprazole  40 mg Oral Q1200  . sodium chloride  3  mL Intravenous Q12H   Continuous Infusions:   . sodium chloride 125 mL/hr at 03/19/11 1215     Assessment/Plan: Principal Problem:  *Chest pain: Status post cardiac cath, showed severe three-vessel native coronary artery disease - Cardiothoracic surgery consulted, patient seen by Dr. Dorris Fetch, recommended CABG next week - Continue aspirin, beta blocker, statins  Active Problems:  TOBACCO ABUSE: Counseled strongly on tobacco cessation   HYPERTENSION: Controlled, continue present medications   CAROTID ARTERY DISEASE: History of carotid endarterectomy  DVT Prophylaxis: SCDs  Code Status: Full code  Disposition: Awaiting CT surgery   LOS: 2 days   Kynslei Art M.D. [facility] 03/20/2011, 12:38 PM Pager: [phone]

## 2011-03-21 DIAGNOSIS — R079 Chest pain, unspecified: Secondary | ICD-10-CM

## 2011-03-21 NOTE — Progress Notes (Signed)
Subjective: Patient seen and examined, denies any chest pain or shortness of breath.  Objective: Vital signs in last 24 hours: Temp:  [97.9 F (36.6 C)-99 F (37.2 C)] 97.9 F (36.6 C) (03/09 0653) Pulse Rate:  [64-96] 96  (03/09 0653) Resp:  [18-19] 18  (03/09 0653) BP: (124-147)/(64-83) 147/83 mmHg (03/09 0653) SpO2:  [92 %-97 %] 97 % (03/09 0653) Weight:  [90.175 kg (198 lb 12.8 oz)] 90.175 kg (198 lb 12.8 oz) (03/09 0653) Weight change: 1.633 kg (3 lb 9.6 oz) Last BM Date: 03/19/11  Intake/Output from previous day: 03/08 0701 - 03/09 0700 In: 840 [P.O.:840] Out: 1500 [Urine:1500] Total I/O In: -  Out: 375 [Urine:375]   Physical Exam: General: Alert, awake, oriented x3, in no acute distress.Marland Kitchen Heart: Regular rate and rhythm, without murmurs, rubs, gallops. Lungs: Clear to auscultation bilaterally. Abdomen: Soft, nontender, nondistended, positive bowel sounds. Extremities: No clubbing cyanosis or edema with positive pedal pulses. Neuro: Grossly intact, nonfocal.    Lab Results: No results found for this or any previous visit (from the past 24 hour(s)).  Studies/Results: No results found.  Medications:    . amLODipine  10 mg Oral Daily  . aspirin  325 mg Oral Daily  . atorvastatin  40 mg Oral q1800  . LORazepam  0.5 mg Oral BID  . metoprolol tartrate  37.5 mg Oral BID  . nitroGLYCERIN  0.4 mg Transdermal Daily  . pantoprazole  40 mg Oral Q1200  . sodium chloride  3 mL Intravenous Q12H    acetaminophen, acetaminophen, ALPRAZolam, ondansetron (ZOFRAN) IV, ondansetron     Assessment/Plan:  Principal Problem:  *Chest pain: Status post cardiac cath, showed severe three-vessel native coronary artery disease  - Scheduled for CABG Monday - Continue aspirin, beta blocker, statins  Active Problems:  TOBACCO ABUSE: Counseled strongly on tobacco cessation  HYPERTENSION: Controlled, continue present medications  CAROTID ARTERY DISEASE: History of carotid  endarterectomy , doppler pending DVT Prophylaxis: SCDs  Code Status: Full code    LOS: 3 days   [name] 03/21/2011, 9:15 AM

## 2011-03-21 NOTE — Progress Notes (Signed)
Patient ID: [name], male   DOB: 21-Jun-1940, 71 y.o.   MRN: [id number]   SUBJECTIVE:  No chest pain    PHYSICAL EXAM Filed Vitals:   03/20/11 0943 03/20/11 2228 03/21/11 0146 03/21/11 0653  BP:  126/68 124/64 147/83  Pulse: 74 75 64 96  Temp:  99 F (37.2 C) 98.8 F (37.1 C) 97.9 F (36.6 C)  TempSrc:  Oral Oral Oral  Resp:  18 19 18   Height:      Weight:    90.175 kg (198 lb 12.8 oz)  SpO2:  92% 94% 97%   General:  No distress Lungs:  Clear with no wheezes. Heart:  RRR, no rub, no murmur Abdomen:  Positive bowel sounds, no rebound no guarding. Extremities:  Right groin without hematoma or bruit.  No pulsatile mass.  LABS: Lab Results  Component Value Date   CKTOTAL 46 03/19/2011   CKMB 1.5 03/19/2011   TROPONINI <0.30 03/19/2011   No results found for this or any previous visit (from the past 24 hour(s)).  Intake/Output Summary (Last 24 hours) at 03/21/11 0817 Last data filed at 03/21/11 0754  Gross per 24 hour  Intake    840 ml  Output   1600 ml  Net   -760 ml   ASSESSMENT AND PLAN:   1)  Chest pain:  Cath 3/7 with high grade LAD stenosis not favorable for PCI.  Plan CABG Monday 3/11.  I have reviewed the cath and agree with the plan.   2)  TOBACCO ABUSE:  Educated   3)  HYPERTENSION:  Continue current meds.   4)  Dyslipidemia:  LDL 70 on current meds.  Continue.   5)  Carotid Doppler:  Follow up Dopplers pending.       Lewayne Bunting 03/21/2011 8:17 AM

## 2011-03-22 ENCOUNTER — Inpatient Hospital Stay (HOSPITAL_COMMUNITY): Payer: Medicare PPO

## 2011-03-22 MED ORDER — METOPROLOL TARTRATE 12.5 MG HALF TABLET
12.5000 mg | ORAL_TABLET | Freq: Once | ORAL | Status: DC
  Filled 2011-03-22: qty 1

## 2011-03-22 MED ORDER — VERAPAMIL HCL 2.5 MG/ML IV SOLN
INTRAVENOUS | Status: AC
  Administered 2011-03-23: 15:00:00
  Filled 2011-03-22 (×2): qty 2.5

## 2011-03-22 MED ORDER — DIAZEPAM 5 MG PO TABS
5.0000 mg | ORAL_TABLET | ORAL | Status: AC
  Administered 2011-03-23: 5 mg via ORAL
  Filled 2011-03-22: qty 1

## 2011-03-22 MED ORDER — CHLORHEXIDINE GLUCONATE 4 % EX LIQD
60.0000 mL | Freq: Once | CUTANEOUS | Status: AC
  Administered 2011-03-22: 4 via TOPICAL
  Filled 2011-03-22: qty 60

## 2011-03-22 MED ORDER — SODIUM CHLORIDE 0.9 % IV SOLN
INTRAVENOUS | Status: AC
  Administered 2011-03-23: .8 [IU]/h via INTRAVENOUS
  Filled 2011-03-22 (×2): qty 1

## 2011-03-22 MED ORDER — DIAZEPAM 5 MG PO TABS: 5.0000 mg | ORAL_TABLET | Freq: Once | ORAL | Status: DC

## 2011-03-22 MED ORDER — DEXMEDETOMIDINE HCL 100 MCG/ML IV SOLN
0.1000 ug/kg/h | INTRAVENOUS | Status: AC
  Administered 2011-03-23: .2 ug/kg/h via INTRAVENOUS
  Filled 2011-03-22 (×2): qty 4

## 2011-03-22 MED ORDER — POTASSIUM CHLORIDE 2 MEQ/ML IV SOLN
80.0000 meq | INTRAVENOUS | Status: DC
  Filled 2011-03-22 (×2): qty 40

## 2011-03-22 MED ORDER — VANCOMYCIN HCL 1000 MG IV SOLR
1500.0000 mg | INTRAVENOUS | Status: AC
  Administered 2011-03-23: 1500 mg via INTRAVENOUS
  Filled 2011-03-22 (×2): qty 1500

## 2011-03-22 MED ORDER — CHLORHEXIDINE GLUCONATE 4 % EX LIQD
60.0000 mL | Freq: Once | CUTANEOUS | Status: DC
  Filled 2011-03-22: qty 60

## 2011-03-22 MED ORDER — NITROGLYCERIN IN D5W 200-5 MCG/ML-% IV SOLN
2.0000 ug/min | INTRAVENOUS | Status: AC
  Administered 2011-03-23: 5 ug/min via INTRAVENOUS
  Filled 2011-03-22: qty 250

## 2011-03-22 MED ORDER — DEXTROSE 5 % IV SOLN
30.0000 ug/min | INTRAVENOUS | Status: AC
  Administered 2011-03-23: 15 ug/min via INTRAVENOUS
  Filled 2011-03-22 (×2): qty 2

## 2011-03-22 MED ORDER — MAGNESIUM SULFATE 50 % IJ SOLN
40.0000 meq | INTRAMUSCULAR | Status: DC
  Filled 2011-03-22 (×2): qty 10

## 2011-03-22 MED ORDER — DEXTROSE 5 % IV SOLN
750.0000 mg | INTRAVENOUS | Status: DC
  Filled 2011-03-22 (×2): qty 750

## 2011-03-22 MED ORDER — SODIUM CHLORIDE 0.9 % IV SOLN
INTRAVENOUS | Status: DC
  Filled 2011-03-22 (×2): qty 40

## 2011-03-22 MED ORDER — CHLORHEXIDINE GLUCONATE 4 % EX LIQD
60.0000 mL | Freq: Once | CUTANEOUS | Status: AC
  Filled 2011-03-22: qty 60

## 2011-03-22 MED ORDER — TEMAZEPAM 15 MG PO CAPS: 15.0000 mg | ORAL_CAPSULE | Freq: Once | ORAL | Status: AC | PRN

## 2011-03-22 MED ORDER — METOPROLOL TARTRATE 12.5 MG HALF TABLET
12.5000 mg | ORAL_TABLET | Freq: Once | ORAL | Status: AC
  Administered 2011-03-23: 12.5 mg via ORAL
  Filled 2011-03-22 (×3): qty 1

## 2011-03-22 MED ORDER — CHLORHEXIDINE GLUCONATE 4 % EX LIQD
60.0000 mL | Freq: Once | CUTANEOUS | Status: DC
  Filled 2011-03-22 (×2): qty 60

## 2011-03-22 MED ORDER — DEXTROSE 5 % IV SOLN
1.5000 g | INTRAVENOUS | Status: AC
  Administered 2011-03-23: .75 g via INTRAVENOUS
  Administered 2011-03-23: 1.5 g via INTRAVENOUS
  Filled 2011-03-22 (×2): qty 1.5

## 2011-03-22 MED ORDER — BISACODYL 5 MG PO TBEC
5.0000 mg | DELAYED_RELEASE_TABLET | Freq: Once | ORAL | Status: AC
  Administered 2011-03-22: 5 mg via ORAL
  Filled 2011-03-22: qty 1

## 2011-03-22 MED ORDER — EPINEPHRINE HCL 1 MG/ML IJ SOLN
0.5000 ug/min | INTRAMUSCULAR | Status: DC
  Filled 2011-03-22 (×2): qty 4

## 2011-03-22 MED ORDER — DOPAMINE-DEXTROSE 3.2-5 MG/ML-% IV SOLN
2.0000 ug/kg/min | INTRAVENOUS | Status: DC
  Filled 2011-03-22 (×2): qty 250

## 2011-03-22 NOTE — Progress Notes (Signed)
Subjective: Patient seen and examined, denies any complaints, however apparently had a transient episode of chest pain and shortness of breath this morning.  Objective: Vital signs in last 24 hours: Temp:  [98.5 F (36.9 C)-98.9 F (37.2 C)] 98.5 F (36.9 C) (03/10 0426) Pulse Rate:  [62-67] 66  (03/10 0426) Resp:  [18] 18  (03/10 0426) BP: (116-132)/(69-71) 116/69 mmHg (03/10 0426) SpO2:  [95 %-98 %] 95 % (03/10 0426) Weight:  [90.3 kg (199 lb 1.2 oz)] 90.3 kg (199 lb 1.2 oz) (03/10 0426) Weight change: 0.125 kg (4.4 oz) Last BM Date: 03/19/11  Intake/Output from previous day: 03/09 0701 - 03/10 0700 In: 660 [P.O.:660] Out: 1950 [Urine:1950] Total I/O In: 240 [P.O.:240] Out: 675 [Urine:675]   Physical Exam: General: Alert, awake, oriented x3, in no acute distress. Heart: Regular rate and rhythm, without murmurs, rubs, gallops. Lungs: Clear to auscultation bilaterally. Abdomen: Soft, nontender, nondistended, positive bowel sounds. Extremities: No clubbing cyanosis or edema with positive pedal pulses. Neuro: Grossly intact, nonfocal.    Lab Results: No results found for this or any previous visit (from the past 24 hour(s)).  Studies/Results: No results found.  Medications:    . amLODipine  10 mg Oral Daily  . aspirin  325 mg Oral Daily  . atorvastatin  40 mg Oral q1800  . LORazepam  0.5 mg Oral BID  . metoprolol tartrate  37.5 mg Oral BID  . nitroGLYCERIN  0.4 mg Transdermal Daily  . pantoprazole  40 mg Oral Q1200  . sodium chloride  3 mL Intravenous Q12H    acetaminophen, acetaminophen, ALPRAZolam, ondansetron (ZOFRAN) IV, ondansetron     Assessment/Plan:  Principal Problem:  *Chest pain: Status post cardiac cath, showed severe three-vessel native coronary artery disease  - Scheduled for CABG tomorrow - Continue nitroglycerin when necessary, aspirin, beta blocker, statins  Active Problems:  TOBACCO ABUSE: Counseled strongly on tobacco cessation    HYPERTENSION: Controlled, continue present medications  CAROTID ARTERY DISEASE: History of carotid endarterectomy  DVT Prophylaxis: SCDs  Code Status: Full code       LOS: 4 days   Lucindy Borel 03/22/2011, 12:27 PM

## 2011-03-22 NOTE — Progress Notes (Signed)
[name]   One transient episode of CP which resolved spontaneously Exam benign Labs okay For CABG tomorrow  [name] H 03/22/2011 1:32 PM

## 2011-03-22 NOTE — Progress Notes (Signed)
Patient ID: [name], male   DOB: 1940-06-15, 71 y.o.   MRN: [id number] Subjective:  One episode of chest pain this morning, resolved spontaneously  Objective:  Vital Signs in the last 24 hours: Temp:  [98.5 F (36.9 C)-98.9 F (37.2 C)] 98.5 F (36.9 C) (03/10 0426) Pulse Rate:  [62-67] 66  (03/10 0426) Resp:  [18] 18  (03/10 0426) BP: (116-132)/(69-71) 116/69 mmHg (03/10 0426) SpO2:  [95 %-98 %] 95 % (03/10 0426) Weight:  [90.3 kg (199 lb 1.2 oz)] 90.3 kg (199 lb 1.2 oz) (03/10 0426)  Intake/Output from previous day: 03/09 0701 - 03/10 0700 In: 660 [P.O.:660] Out: 1950 [Urine:1950] Intake/Output from this shift: Total I/O In: -  Out: 325 [Urine:325]  Physical Exam: Well appearing NAD HEENT: Unremarkable Neck:  No JVD, no thyromegally Lymphatics:  No adenopathy Back:  No CVA tenderness Lungs:  Clear with no wheezes. HEART:  Regular rate rhythm, no murmurs, no rubs, no clicks Abd:  Flat, positive bowel sounds, no organomegally, no rebound, no guarding Ext:  2 plus pulses, no edema, no cyanosis, no clubbing Skin:  No rashes no nodules Neuro:  CN II through XII intact, motor grossly intact  Lab Results: No results found for this basename: WBC:2,HGB:2,PLT:2 in the last 72 hours No results found for this basename: NA:2,K:2,CL:2,CO2:2,GLUCOSE:2,BUN:2,CREATININE:2 in the last 72 hours  Basename 03/19/11 1410  TROPONINI <0.30   Hepatic Function Panel No results found for this basename: PROT,ALBUMIN,AST,ALT,ALKPHOS,BILITOT,BILIDIR,IBILI in the last 72 hours No results found for this basename: CHOL in the last 72 hours No results found for this basename: PROTIME in the last 72 hours  Imaging: No results found.  Cardiac Studies: Tele - NSR Assessment/Plan:  1. CAD/USA - plan CABG in a.m. He will call for recurrent pain and will adjust meds as needed.   LOS: 4 days    Lewayne Bunting 03/22/2011, 8:56 AM

## 2011-03-23 ENCOUNTER — Encounter (HOSPITAL_COMMUNITY)
Admission: EM | Disposition: A | Discharge status: Skilled Nursing Facility | Payer: Self-pay | Source: Ambulatory Visit | Attending: Thoracic Surgery (Cardiothoracic Vascular Surgery)

## 2011-03-23 ENCOUNTER — Other Ambulatory Visit: Payer: Self-pay

## 2011-03-23 ENCOUNTER — Inpatient Hospital Stay (HOSPITAL_COMMUNITY): Payer: Medicare PPO | Admitting: Anesthesiology

## 2011-03-23 ENCOUNTER — Encounter (HOSPITAL_COMMUNITY): Payer: Self-pay | Admitting: Anesthesiology

## 2011-03-23 ENCOUNTER — Inpatient Hospital Stay (HOSPITAL_COMMUNITY): Payer: Medicare PPO

## 2011-03-23 DIAGNOSIS — I2510 Atherosclerotic heart disease of native coronary artery without angina pectoris: Secondary | ICD-10-CM

## 2011-03-23 HISTORY — PX: CORONARY ARTERY BYPASS GRAFT: SHX141

## 2011-03-23 LAB — POCT I-STAT 3, ART BLOOD GAS (G3+)
Acid-base deficit: 2 mmol/L (ref 0.0–2.0)
Acid-base deficit: 5 mmol/L — ABNORMAL HIGH (ref 0.0–2.0)
Bicarbonate: 23.8 mEq/L (ref 20.0–24.0)
Bicarbonate: 22.4 mEq/L (ref 20.0–24.0)
Bicarbonate: 20.7 mEq/L (ref 20.0–24.0)
O2 Saturation: 100 %
O2 Saturation: 100 %
O2 Saturation: 96 %
Patient temperature: 35.8
TCO2: 22 mmol/L (ref 0–100)
pCO2 arterial: 44.9 mmHg (ref 35.0–45.0)
pO2, Arterial: 327 mmHg — ABNORMAL HIGH (ref 80.0–100.0)

## 2011-03-23 LAB — CBC
HCT: 39 % (ref 39.0–52.0)
HCT: 39.3 % (ref 39.0–52.0)
Hemoglobin: 11.7 g/dL — ABNORMAL LOW (ref 13.0–17.0)
Hemoglobin: 13.4 g/dL (ref 13.0–17.0)
Hemoglobin: 13.5 g/dL (ref 13.0–17.0)
MCH: 32.7 pg (ref 26.0–34.0)
MCH: 32.7 pg (ref 26.0–34.0)
MCHC: 34.3 g/dL (ref 30.0–36.0)
MCHC: 34.4 g/dL (ref 30.0–36.0)
MCHC: 34.4 g/dL (ref 30.0–36.0)
MCV: 95.1 fL (ref 78.0–100.0)
MCV: 95.3 fL (ref 78.0–100.0)

## 2011-03-23 LAB — HEMOGLOBIN A1C: Mean Plasma Glucose: 111 mg/dL (ref <117)

## 2011-03-23 LAB — BLOOD GAS, ARTERIAL
Drawn by: 308601
FIO2: 0.21 %
pCO2 arterial: 33.1 mmHg — ABNORMAL LOW (ref 35.0–45.0)
pH, Arterial: 7.447 (ref 7.350–7.450)

## 2011-03-23 LAB — POCT I-STAT 4, (NA,K, GLUC, HGB,HCT)
Glucose, Bld: 89 mg/dL (ref 70–99)
Glucose, Bld: 95 mg/dL (ref 70–99)
HCT: 34 % — ABNORMAL LOW (ref 39.0–52.0)
HCT: 25 % — ABNORMAL LOW (ref 39.0–52.0)
HCT: 27 % — ABNORMAL LOW (ref 39.0–52.0)
Hemoglobin: 11.6 g/dL — ABNORMAL LOW (ref 13.0–17.0)
Hemoglobin: 8.5 g/dL — ABNORMAL LOW (ref 13.0–17.0)
Hemoglobin: 11.2 g/dL — ABNORMAL LOW (ref 13.0–17.0)
Potassium: 4.4 mEq/L (ref 3.5–5.1)
Potassium: 4.4 mEq/L (ref 3.5–5.1)
Potassium: 5.5 mEq/L — ABNORMAL HIGH (ref 3.5–5.1)
Potassium: 4.1 mEq/L (ref 3.5–5.1)
Sodium: 140 mEq/L (ref 135–145)
Sodium: 142 mEq/L (ref 135–145)
Sodium: 138 mEq/L (ref 135–145)
Sodium: 138 mEq/L (ref 135–145)
Sodium: 137 mEq/L (ref 135–145)
Sodium: 139 mEq/L (ref 135–145)

## 2011-03-23 LAB — BASIC METABOLIC PANEL
BUN: 14 mg/dL (ref 6–23)
BUN: 15 mg/dL (ref 6–23)
Calcium: 9 mg/dL (ref 8.4–10.5)
Chloride: 105 mEq/L (ref 96–112)
Creatinine, Ser: 1.17 mg/dL (ref 0.50–1.35)
GFR calc non Af Amer: 55 mL/min — ABNORMAL LOW (ref >90)
GFR calc non Af Amer: 62 mL/min — ABNORMAL LOW (ref >90)
Glucose, Bld: 93 mg/dL (ref 70–99)
Glucose, Bld: 97 mg/dL (ref 70–99)
Potassium: 4 mEq/L (ref 3.5–5.1)
Potassium: 4 mEq/L (ref 3.5–5.1)

## 2011-03-23 LAB — PROTIME-INR: Prothrombin Time: 18 seconds — ABNORMAL HIGH (ref 11.6–15.2)

## 2011-03-23 LAB — PLATELET COUNT: Platelets: 116 10*3/uL — ABNORMAL LOW (ref 150–400)

## 2011-03-23 LAB — TYPE AND SCREEN

## 2011-03-23 SURGERY — CORONARY ARTERY BYPASS GRAFTING (CABG)
Anesthesia: General | Site: Chest

## 2011-03-23 MED ORDER — BISACODYL 10 MG RE SUPP: 10.0000 mg | Freq: Every day | RECTAL | Status: DC

## 2011-03-23 MED ORDER — ALBUMIN HUMAN 5 % IV SOLN
250.0000 mL | INTRAVENOUS | Status: AC | PRN
  Administered 2011-03-23: 250 mL via INTRAVENOUS

## 2011-03-23 MED ORDER — FAMOTIDINE IN NACL 20-0.9 MG/50ML-% IV SOLN
20.0000 mg | Freq: Two times a day (BID) | INTRAVENOUS | Status: AC
  Administered 2011-03-23 – 2011-03-24 (×2): 20 mg via INTRAVENOUS
  Filled 2011-03-23: qty 50

## 2011-03-23 MED ORDER — MAGNESIUM SULFATE 40 MG/ML IJ SOLN
INTRAMUSCULAR | Status: AC
  Administered 2011-03-23: 4 g via INTRAVENOUS
  Filled 2011-03-23: qty 100

## 2011-03-23 MED ORDER — PROTAMINE SULFATE 10 MG/ML IV SOLN
INTRAVENOUS | Status: DC | PRN
  Administered 2011-03-23: 15 mg via INTRAVENOUS
  Administered 2011-03-23: 5 mg via INTRAVENOUS
  Administered 2011-03-23: 7 mg via INTRAVENOUS

## 2011-03-23 MED ORDER — SODIUM CHLORIDE 0.9 % IV SOLN: INTRAVENOUS | Status: DC

## 2011-03-23 MED ORDER — 0.9 % SODIUM CHLORIDE (POUR BTL) OPTIME
TOPICAL | Status: DC | PRN
  Administered 2011-03-23: 1000 mL

## 2011-03-23 MED ORDER — OXYCODONE HCL 5 MG PO TABS
5.0000 mg | ORAL_TABLET | ORAL | Status: DC | PRN
  Administered 2011-03-24: 10 mg via ORAL
  Administered 2011-03-24 (×2): 5 mg via ORAL
  Filled 2011-03-23 (×2): qty 2

## 2011-03-23 MED ORDER — MIDAZOLAM HCL 2 MG/2ML IJ SOLN: 2.0000 mg | INTRAMUSCULAR | Status: DC | PRN

## 2011-03-23 MED ORDER — ALBUMIN HUMAN 5 % IV SOLN
INTRAVENOUS | Status: DC | PRN
  Administered 2011-03-23 (×2): via INTRAVENOUS

## 2011-03-23 MED ORDER — METOPROLOL TARTRATE 12.5 MG HALF TABLET
12.5000 mg | ORAL_TABLET | Freq: Two times a day (BID) | ORAL | Status: DC
  Administered 2011-03-24 – 2011-03-29 (×11): 12.5 mg via ORAL
  Filled 2011-03-23 (×13): qty 1

## 2011-03-23 MED ORDER — VECURONIUM BROMIDE 10 MG IV SOLR
INTRAVENOUS | Status: DC | PRN
  Administered 2011-03-23: 5 mg via INTRAVENOUS
  Administered 2011-03-23: 3 mg via INTRAVENOUS
  Administered 2011-03-23: 10 mg via INTRAVENOUS
  Administered 2011-03-23: 5 mg via INTRAVENOUS

## 2011-03-23 MED ORDER — NITROGLYCERIN IN D5W 200-5 MCG/ML-% IV SOLN: 0.0000 ug/min | INTRAVENOUS | Status: DC

## 2011-03-23 MED ORDER — 0.9 % SODIUM CHLORIDE (POUR BTL) OPTIME
TOPICAL | Status: DC | PRN
  Administered 2011-03-23: 6000 mL

## 2011-03-23 MED ORDER — ASPIRIN EC 325 MG PO TBEC
325.0000 mg | DELAYED_RELEASE_TABLET | Freq: Every day | ORAL | Status: DC
  Administered 2011-03-24 – 2011-04-01 (×9): 325 mg via ORAL
  Filled 2011-03-23 (×9): qty 1

## 2011-03-23 MED ORDER — ACETAMINOPHEN 650 MG RE SUPP
650.0000 mg | RECTAL | Status: AC
  Administered 2011-03-23: 650 mg via RECTAL

## 2011-03-23 MED ORDER — MAGNESIUM SULFATE 40 MG/ML IJ SOLN
4.0000 g | Freq: Once | INTRAMUSCULAR | Status: AC
  Administered 2011-03-23: 4 g via INTRAVENOUS

## 2011-03-23 MED ORDER — PROPOFOL 10 MG/ML IV EMUL
INTRAVENOUS | Status: DC | PRN
  Administered 2011-03-23: 100 mg via INTRAVENOUS

## 2011-03-23 MED ORDER — VANCOMYCIN HCL 1000 MG IV SOLR
1000.0000 mg | Freq: Once | INTRAVENOUS | Status: AC
  Administered 2011-03-24: 1000 mg via INTRAVENOUS
  Filled 2011-03-23: qty 1000

## 2011-03-23 MED ORDER — SODIUM CHLORIDE 0.45 % IV SOLN: INTRAVENOUS | Status: DC

## 2011-03-23 MED ORDER — DEXTROSE 5 % IV SOLN
1.5000 g | Freq: Two times a day (BID) | INTRAVENOUS | Status: AC
  Administered 2011-03-23 – 2011-03-25 (×4): 1.5 g via INTRAVENOUS
  Filled 2011-03-23 (×4): qty 1.5

## 2011-03-23 MED ORDER — HEMOSTATIC AGENTS (NO CHARGE) OPTIME
TOPICAL | Status: DC | PRN
  Administered 2011-03-23: 3 via TOPICAL

## 2011-03-23 MED ORDER — MORPHINE SULFATE 2 MG/ML IJ SOLN
1.0000 mg | INTRAMUSCULAR | Status: AC | PRN
  Administered 2011-03-24 (×2): 2 mg via INTRAVENOUS
  Filled 2011-03-23 (×2): qty 1

## 2011-03-23 MED ORDER — METOPROLOL TARTRATE 1 MG/ML IV SOLN: 2.5000 mg | INTRAVENOUS | Status: DC | PRN

## 2011-03-23 MED ORDER — LACTATED RINGERS IV SOLN: 500.0000 mL | Freq: Once | INTRAVENOUS | Status: AC | PRN

## 2011-03-23 MED ORDER — LACTATED RINGERS IV SOLN
INTRAVENOUS | Status: DC | PRN
  Administered 2011-03-23 (×2): via INTRAVENOUS

## 2011-03-23 MED ORDER — PANTOPRAZOLE SODIUM 40 MG PO TBEC
40.0000 mg | DELAYED_RELEASE_TABLET | Freq: Every day | ORAL | Status: DC
  Administered 2011-03-25 – 2011-04-01 (×8): 40 mg via ORAL
  Filled 2011-03-23 (×8): qty 1

## 2011-03-23 MED ORDER — LACTATED RINGERS IV SOLN
INTRAVENOUS | Status: DC
  Administered 2011-03-23: 13:00:00 via INTRAVENOUS

## 2011-03-23 MED ORDER — DOCUSATE SODIUM 100 MG PO CAPS
200.0000 mg | ORAL_CAPSULE | Freq: Every day | ORAL | Status: DC
  Administered 2011-03-24 – 2011-03-31 (×8): 200 mg via ORAL
  Filled 2011-03-23 (×8): qty 2

## 2011-03-23 MED ORDER — SODIUM CHLORIDE 0.9 % IJ SOLN: 3.0000 mL | INTRAMUSCULAR | Status: DC | PRN

## 2011-03-23 MED ORDER — MIDAZOLAM HCL 5 MG/5ML IJ SOLN
INTRAMUSCULAR | Status: DC | PRN
  Administered 2011-03-23: 2 mg via INTRAVENOUS
  Administered 2011-03-23: 3 mg via INTRAVENOUS
  Administered 2011-03-23: 2 mg via INTRAVENOUS
  Administered 2011-03-23: 1 mg via INTRAVENOUS
  Administered 2011-03-23 (×2): 2 mg via INTRAVENOUS
  Administered 2011-03-23: 1 mg via INTRAVENOUS

## 2011-03-23 MED ORDER — ACETAMINOPHEN 160 MG/5ML PO SOLN: 975.0000 mg | Freq: Four times a day (QID) | ORAL | Status: DC

## 2011-03-23 MED ORDER — FENTANYL CITRATE 0.05 MG/ML IJ SOLN
INTRAMUSCULAR | Status: DC | PRN
  Administered 2011-03-23 (×3): 100 ug via INTRAVENOUS
  Administered 2011-03-23: 150 ug via INTRAVENOUS
  Administered 2011-03-23: 50 ug via INTRAVENOUS
  Administered 2011-03-23: 150 ug via INTRAVENOUS
  Administered 2011-03-23 (×3): 250 ug via INTRAVENOUS

## 2011-03-23 MED ORDER — ACETAMINOPHEN 160 MG/5ML PO SOLN: 650.0000 mg | ORAL | Status: AC

## 2011-03-23 MED ORDER — POTASSIUM CHLORIDE 10 MEQ/50ML IV SOLN: 10.0000 meq | INTRAVENOUS | Status: AC

## 2011-03-23 MED ORDER — ASPIRIN 81 MG PO CHEW: 324.0000 mg | CHEWABLE_TABLET | Freq: Every day | ORAL | Status: DC

## 2011-03-23 MED ORDER — ONDANSETRON HCL 4 MG/2ML IJ SOLN: 4.0000 mg | Freq: Four times a day (QID) | INTRAMUSCULAR | Status: DC | PRN

## 2011-03-23 MED ORDER — LACTATED RINGERS IV SOLN
INTRAVENOUS | Status: DC | PRN
  Administered 2011-03-23: 14:00:00 via INTRAVENOUS

## 2011-03-23 MED ORDER — SODIUM CHLORIDE 0.9 % IV SOLN
0.1000 ug/kg/h | INTRAVENOUS | Status: DC
  Filled 2011-03-23: qty 2

## 2011-03-23 MED ORDER — HEMOSTATIC AGENTS (NO CHARGE) OPTIME
TOPICAL | Status: DC | PRN
  Administered 2011-03-23: 1 via TOPICAL

## 2011-03-23 MED ORDER — ACETAMINOPHEN 500 MG PO TABS
1000.0000 mg | ORAL_TABLET | Freq: Four times a day (QID) | ORAL | Status: DC
  Filled 2011-03-23 (×4): qty 2

## 2011-03-23 MED ORDER — INSULIN REGULAR BOLUS VIA INFUSION
0.0000 [IU] | Freq: Three times a day (TID) | INTRAVENOUS | Status: DC
  Filled 2011-03-23: qty 10

## 2011-03-23 MED ORDER — SODIUM CHLORIDE 0.9 % IV SOLN
INTRAVENOUS | Status: DC
  Filled 2011-03-23: qty 1

## 2011-03-23 MED ORDER — SODIUM CHLORIDE 0.9 % IJ SOLN
3.0000 mL | Freq: Two times a day (BID) | INTRAMUSCULAR | Status: DC
  Administered 2011-03-24 – 2011-03-25 (×3): 3 mL via INTRAVENOUS

## 2011-03-23 MED ORDER — SODIUM CHLORIDE 0.9 % IV SOLN
250.0000 mL | INTRAVENOUS | Status: DC
  Administered 2011-03-24 (×2): 250 mL via INTRAVENOUS

## 2011-03-23 MED ORDER — METOPROLOL TARTRATE 25 MG/10 ML ORAL SUSPENSION
12.5000 mg | Freq: Two times a day (BID) | ORAL | Status: DC
  Filled 2011-03-23 (×11): qty 5

## 2011-03-23 MED ORDER — ROCURONIUM BROMIDE 100 MG/10ML IV SOLN
INTRAVENOUS | Status: DC | PRN
  Administered 2011-03-23: 50 mg via INTRAVENOUS

## 2011-03-23 MED ORDER — PHENYLEPHRINE HCL 10 MG/ML IJ SOLN
0.0000 ug/min | INTRAMUSCULAR | Status: DC
  Filled 2011-03-23: qty 2

## 2011-03-23 MED ORDER — LACTATED RINGERS IV SOLN: INTRAVENOUS | Status: DC

## 2011-03-23 MED ORDER — BISACODYL 5 MG PO TBEC
10.0000 mg | DELAYED_RELEASE_TABLET | Freq: Every day | ORAL | Status: DC
  Administered 2011-03-24 – 2011-03-30 (×5): 10 mg via ORAL
  Filled 2011-03-23 (×4): qty 2

## 2011-03-23 MED ORDER — MORPHINE SULFATE 4 MG/ML IJ SOLN: 2.0000 mg | INTRAMUSCULAR | Status: DC | PRN

## 2011-03-23 MED ORDER — SODIUM CHLORIDE 0.9 % IV SOLN
10.0000 g | INTRAVENOUS | Status: DC | PRN
  Administered 2011-03-23: 5 g/h via INTRAVENOUS

## 2011-03-23 MED ORDER — HEPARIN SODIUM (PORCINE) 1000 UNIT/ML IJ SOLN
INTRAMUSCULAR | Status: DC | PRN
  Administered 2011-03-23: 27000 [IU] via INTRAVENOUS
  Administered 2011-03-23: 2000 [IU] via INTRAVENOUS

## 2011-03-23 SURGICAL SUPPLY — 105 items
ADAPTER CARDIO PERF ANTE/RETRO (ADAPTER) IMPLANT
ADH SKN CLS APL DERMABOND .7 (GAUZE/BANDAGES/DRESSINGS) ×3
ADPR PRFSN 84XANTGRD RTRGD (ADAPTER)
APPLIER CLIP 9.375 SM OPEN (CLIP)
APR CLP SM 9.3 20 MLT OPN (CLIP)
ATTRACTOMAT 16X20 MAGNETIC DRP (DRAPES) ×2 IMPLANT
BAG DECANTER FOR FLEXI CONT (MISCELLANEOUS) ×2 IMPLANT
BANDAGE ACE 4 STERILE (GAUZE/BANDAGES/DRESSINGS) ×2 IMPLANT
BANDAGE ELASTIC 6 VELCRO ST LF (GAUZE/BANDAGES/DRESSINGS) ×2 IMPLANT
BANDAGE GAUZE ELAST BULKY 4 IN (GAUZE/BANDAGES/DRESSINGS) ×2 IMPLANT
BASKET HEART (ORDER IN 25'S) (MISCELLANEOUS) ×1
BASKET HEART (ORDER IN 25S) (MISCELLANEOUS) ×1 IMPLANT
BLADE SAW STERNAL (BLADE) ×2 IMPLANT
BLADE SURG 11 STRL SS (BLADE) IMPLANT
CANISTER SUCTION 2500CC (MISCELLANEOUS) ×2 IMPLANT
CANN PRFSN .5XCNCT 15X34-48 (MISCELLANEOUS) ×1
CANNULA GUNDRY RCSP 15FR (MISCELLANEOUS) IMPLANT
CANNULA PRFSN .5XCNCT 15X34-48 (MISCELLANEOUS) ×1 IMPLANT
CANNULA VEN 2 STAGE (MISCELLANEOUS) ×2
CANNULA VESSEL W/WING (CANNULA) ×1 IMPLANT
CANNULA VESSEL W/WING W/VALVE (CANNULA) ×1 IMPLANT
CATH CPB KIT HENDRICKSON (MISCELLANEOUS) ×2 IMPLANT
CATH ROBINSON RED A/P 18FR (CATHETERS) ×4 IMPLANT
CATH THORACIC 36FR (CATHETERS) ×1 IMPLANT
CATH THORACIC 36FR RT ANG (CATHETERS) ×1 IMPLANT
CLIP APPLIE 9.375 SM OPEN (CLIP) IMPLANT
CLIP FOGARTY SPRING 6M (CLIP) ×1 IMPLANT
CLIP TI MEDIUM 24 (CLIP) IMPLANT
CLIP TI WIDE RED SMALL 24 (CLIP) ×3 IMPLANT
CLOTH BEACON ORANGE TIMEOUT ST (SAFETY) ×2 IMPLANT
CONN Y 3/8X3/8X3/8  BEN (MISCELLANEOUS)
CONN Y 3/8X3/8X3/8 BEN (MISCELLANEOUS) IMPLANT
COVER SURGICAL LIGHT HANDLE (MISCELLANEOUS) ×4 IMPLANT
CRADLE DONUT ADULT HEAD (MISCELLANEOUS) ×2 IMPLANT
DERMABOND ADVANCED (GAUZE/BANDAGES/DRESSINGS) ×3
DERMABOND ADVANCED .7 DNX12 (GAUZE/BANDAGES/DRESSINGS) IMPLANT
DRAPE CARDIOVASCULAR INCISE (DRAPES) ×2
DRAPE SLUSH/WARMER DISC (DRAPES) ×1 IMPLANT
DRAPE SRG 135X102X78XABS (DRAPES) ×1 IMPLANT
DRSG COVADERM 4X14 (GAUZE/BANDAGES/DRESSINGS) ×2 IMPLANT
ELECT PAD GROUND ADT 9 (MISCELLANEOUS) IMPLANT
ELECT REM PT RETURN 9FT ADLT (ELECTROSURGICAL) ×4
ELECTRODE REM PT RTRN 9FT ADLT (ELECTROSURGICAL) ×2 IMPLANT
GLOVE BIO SURGEON STRL SZ 6 (GLOVE) IMPLANT
GLOVE BIO SURGEON STRL SZ 6.5 (GLOVE) IMPLANT
GLOVE BIO SURGEON STRL SZ7.5 (GLOVE) ×2 IMPLANT
GLOVE BIOGEL PI IND STRL 6 (GLOVE) IMPLANT
GLOVE BIOGEL PI IND STRL 6.5 (GLOVE) IMPLANT
GLOVE BIOGEL PI IND STRL 7.0 (GLOVE) IMPLANT
GLOVE BIOGEL PI INDICATOR 6 (GLOVE) ×4
GLOVE BIOGEL PI INDICATOR 6.5 (GLOVE) ×3
GLOVE BIOGEL PI INDICATOR 7.0 (GLOVE) ×3
GLOVE EUDERMIC 7 POWDERFREE (GLOVE) ×6 IMPLANT
GOWN PREVENTION PLUS XLARGE (GOWN DISPOSABLE) ×8 IMPLANT
GOWN STRL NON-REIN LRG LVL3 (GOWN DISPOSABLE) ×12 IMPLANT
HEMOSTAT POWDER SURGIFOAM 1G (HEMOSTASIS) ×6 IMPLANT
HEMOSTAT SURGICEL 2X14 (HEMOSTASIS) ×2 IMPLANT
INSERT FOGARTY XLG (MISCELLANEOUS) IMPLANT
KIT BASIN OR (CUSTOM PROCEDURE TRAY) ×2 IMPLANT
KIT PAIN CUSTOM (MISCELLANEOUS) IMPLANT
KIT ROOM TURNOVER OR (KITS) ×2 IMPLANT
KIT SUCTION CATH 14FR (SUCTIONS) ×4 IMPLANT
KIT VASOVIEW 6 PRO VH 2400 (KITS) ×2 IMPLANT
MARKER GRAFT CORONARY BYPASS (MISCELLANEOUS) ×6 IMPLANT
NS IRRIG 1000ML POUR BTL (IV SOLUTION) ×10 IMPLANT
PACK OPEN HEART (CUSTOM PROCEDURE TRAY) ×2 IMPLANT
PAD ARMBOARD 7.5X6 YLW CONV (MISCELLANEOUS) ×4 IMPLANT
PENCIL BUTTON HOLSTER BLD 10FT (ELECTRODE) ×2 IMPLANT
PUNCH AORTIC ROTATE 4.0MM (MISCELLANEOUS) IMPLANT
PUNCH AORTIC ROTATE 4.5MM 8IN (MISCELLANEOUS) ×1 IMPLANT
PUNCH AORTIC ROTATE 5MM 8IN (MISCELLANEOUS) IMPLANT
SET CARDIOPLEGIA MPS 5001102 (MISCELLANEOUS) ×1 IMPLANT
SPONGE GAUZE 4X4 12PLY (GAUZE/BANDAGES/DRESSINGS) ×2 IMPLANT
SUT BONE WAX W31G (SUTURE) ×1 IMPLANT
SUT MNCRL AB 4-0 PS2 18 (SUTURE) IMPLANT
SUT PROLENE 3 0 SH DA (SUTURE) ×2 IMPLANT
SUT PROLENE 4 0 RB 1 (SUTURE)
SUT PROLENE 4 0 SH DA (SUTURE) IMPLANT
SUT PROLENE 4-0 RB1 .5 CRCL 36 (SUTURE) IMPLANT
SUT PROLENE 6 0 C 1 30 (SUTURE) ×6 IMPLANT
SUT PROLENE 7 0 BV1 MDA (SUTURE) ×5 IMPLANT
SUT PROLENE 8 0 BV175 6 (SUTURE) IMPLANT
SUT SILK  1 MH (SUTURE) ×1
SUT SILK 1 MH (SUTURE) ×1 IMPLANT
SUT STEEL 6MS V (SUTURE) ×1 IMPLANT
SUT STEEL STERNAL CCS#1 18IN (SUTURE) ×1 IMPLANT
SUT STEEL SZ 6 DBL 3X14 BALL (SUTURE) ×1 IMPLANT
SUT VIC AB 1 CTX 36 (SUTURE) ×4
SUT VIC AB 1 CTX36XBRD ANBCTR (SUTURE) ×2 IMPLANT
SUT VIC AB 2-0 CT1 27 (SUTURE) ×4
SUT VIC AB 2-0 CT1 TAPERPNT 27 (SUTURE) IMPLANT
SUT VIC AB 2-0 CTX 27 (SUTURE) IMPLANT
SUT VIC AB 3-0 SH 27 (SUTURE)
SUT VIC AB 3-0 SH 27X BRD (SUTURE) IMPLANT
SUT VIC AB 3-0 X1 27 (SUTURE) ×2 IMPLANT
SUT VICRYL 4-0 PS2 18IN ABS (SUTURE) IMPLANT
SUTURE E-PAK OPEN HEART (SUTURE) ×2 IMPLANT
SYSTEM SAHARA CHEST DRAIN ATS (WOUND CARE) ×2 IMPLANT
TAPE CLOTH SURG 4X10 WHT LF (GAUZE/BANDAGES/DRESSINGS) ×2 IMPLANT
TOWEL OR 17X24 6PK STRL BLUE (TOWEL DISPOSABLE) ×4 IMPLANT
TOWEL OR 17X26 10 PK STRL BLUE (TOWEL DISPOSABLE) ×6 IMPLANT
TRAY FOLEY IC TEMP SENS 14FR (CATHETERS) ×2 IMPLANT
TUBING INSUFFLATION 10FT LAP (TUBING) ×2 IMPLANT
UNDERPAD 30X30 INCONTINENT (UNDERPADS AND DIAPERS) ×2 IMPLANT
WATER STERILE IRR 1000ML POUR (IV SOLUTION) ×4 IMPLANT

## 2011-03-23 NOTE — Brief Op Note (Addendum)
                   [address]            [place] [address]          [phone]    03/18/2011 - 03/23/2011  5:58 PM  PATIENT:  Nicole Kindred  71 y.o. male  PRE-OPERATIVE DIAGNOSIS:  CAD  POST-OPERATIVE DIAGNOSIS:  CAD  PROCEDURE:  Procedure(s): CORONARY ARTERY BYPASS GRAFTING (CABG)x4 (LIMA-LAD; SVG-AM; SVG-OM3; SVG-D1) EVH RIGHT LEG, LEFT THIGH SURGEON:  Surgeon(s): Loreli Slot, MD  PHYSICIAN ASSISTANT: WAYNE GOLD PA-C  ANESTHESIA:   general  PATIENT CONDITION:  ICU - intubated and hemodynamically stable.  PRE-OPERATIVE WEIGHT: 90kg  COMPLICATIONS- NO KNOWN   XC 67 min  CPB 101 min  Off pump with no inotropes. Transient hypotension with protamine, no change in PA pressures

## 2011-03-23 NOTE — H&P (View-Only) (Signed)
1 Day Post-Op Procedure(s) (LRB): LEFT HEART CATHETERIZATION WITH CORONARY ANGIOGRAM (N/A) Subjective: Feels well this afternoon. Had an episode of dizziness and disorientation during PFTs this AM Denies CP  Objective: Vital signs in last 24 hours: Temp:  [98.1 F (36.7 C)-99.6 F (37.6 C)] 98.9 F (37.2 C) (03/08 0516) Pulse Rate:  [74-87] 74  (03/08 0943) Cardiac Rhythm:  [-] Normal sinus rhythm (03/08 1057) Resp:  [18-20] 18  (03/08 0516) BP: (117-155)/(67-91) 152/91 mmHg (03/08 0516) SpO2:  [96 %-97 %] 96 % (03/08 0516) Weight:  [195 lb 3.2 oz (88.542 kg)] 195 lb 3.2 oz (88.542 kg) (03/08 0516)  Hemodynamic parameters for last 24 hours:    Intake/Output from previous day: 03/07 0701 - 03/08 0700 In: 1227.1 [P.O.:600; I.V.:627.1] Out: 2025 [Urine:2025] Intake/Output this shift: Total I/O In: 450 [P.O.:450] Out: 375 [Urine:375]  General appearance: alert and no distress Neurologic: intact Heart: regular rate and rhythm  Lab Results:  Basename 03/19/11 0557 03/18/11 2200  WBC 7.5 8.2  HGB 13.3 14.7  HCT 39.3 42.7  PLT 184 181   BMET:  Basename 03/19/11 0557 03/18/11 2200 03/18/11 1749  NA 144 -- 139  K 4.3 -- 4.1  CL 111 -- 106  CO2 26 -- 23  GLUCOSE 92 -- 88  BUN 13 -- 11  CREATININE 1.31 1.24 --  CALCIUM 9.1 -- 9.8    PT/INR:  Basename 03/19/11 0840  LABPROT 14.6  INR 1.12   ABG No results found for this basename: phart, pco2, po2, hco3, tco2, acidbasedef, o2sat   CBG (last 3)  No results found for this basename: GLUCAP:3 in the last 72 hours  Assessment/Plan: S/P Procedure(s) (LRB): LEFT HEART CATHETERIZATION WITH CORONARY ANGIOGRAM (N/A) For CABG Monday 2nd case All questions answered   LOS: 2 days    [name] C 03/20/2011

## 2011-03-23 NOTE — Progress Notes (Signed)
Subjective: Patient was  seen and examined this morning, awaiting surgery. He denies any chest pain or shortness of breath.  Objective: Vital signs in last 24 hours: Temp:  [98 F (36.7 C)-98.9 F (37.2 C)] 98.9 F (37.2 C) (03/11 0529) Pulse Rate:  [55-67] 59  (03/11 0529) Resp:  [17-18] 17  (03/11 0529) BP: (107-120)/(59-69) 115/69 mmHg (03/11 0529) SpO2:  [96 %-98 %] 96 % (03/11 0529) Weight:  [89.8 kg (197 lb 15.6 oz)] 89.8 kg (197 lb 15.6 oz) (03/11 0529) Weight change: -0.5 kg (-1 lb 1.6 oz) Last BM Date: 03/19/11  Intake/Output from previous day: 03/10 0701 - 03/11 0700 In: 960 [P.O.:960] Out: 1625 [Urine:1625]     Physical Exam: General: Alert, awake, oriented x3, in no acute distress.  Heart: Regular rate and rhythm, without murmurs, rubs, gallops.  Lungs: Clear to auscultation bilaterally.  Abdomen: Soft, nontender, nondistended, positive bowel sounds.  Extremities: No clubbing cyanosis or edema with positive pedal pulses.  Neuro: Grossly intact, nonfocal.        Lab Results: Results for orders placed during the hospital encounter of 03/18/11 (from the past 24 hour(s))  CBC     Status: Abnormal   Collection Time   03/22/11 11:41 PM      Component Value Range   WBC 8.3  4.0 - 10.5 (K/uL)   RBC 4.13 (*) 4.22 - 5.81 (MIL/uL)   Hemoglobin 13.5  13.0 - 17.0 (g/dL)   HCT 47.8  29.5 - 62.1 (%)   MCV 95.2  78.0 - 100.0 (fL)   MCH 32.7  26.0 - 34.0 (pg)   MCHC 34.4  30.0 - 36.0 (g/dL)   RDW 30.8  65.7 - 84.6 (%)   Platelets 184  150 - 400 (K/uL)  BASIC METABOLIC PANEL     Status: Abnormal   Collection Time   03/22/11 11:41 PM      Component Value Range   Sodium 138  135 - 145 (mEq/L)   Potassium 4.0  3.5 - 5.1 (mEq/L)   Chloride 105  96 - 112 (mEq/L)   CO2 24  19 - 32 (mEq/L)   Glucose, Bld 97  70 - 99 (mg/dL)   BUN 15  6 - 23 (mg/dL)   Creatinine, Ser 9.62  0.50 - 1.35 (mg/dL)   Calcium 8.8  8.4 - 95.2 (mg/dL)   GFR calc non Af Amer 55 (*) >90 (mL/min)     GFR calc Af Amer 64 (*) >90 (mL/min)  TYPE AND SCREEN     Status: Normal   Collection Time   03/22/11 11:49 PM      Component Value Range   ABO/RH(D) B POS     Antibody Screen NEG     Sample Expiration 03/25/2011    BLOOD GAS, ARTERIAL     Status: Abnormal   Collection Time   03/23/11 12:28 AM      Component Value Range   FIO2 0.21     Delivery systems ROOM AIR     pH, Arterial 7.447  7.350 - 7.450    pCO2 arterial 33.1 (*) 35.0 - 45.0 (mmHg)   pO2, Arterial 110.0 (*) 80.0 - 100.0 (mmHg)   Bicarbonate 22.5  20.0 - 24.0 (mEq/L)   TCO2 23.5  0 - 100 (mmol/L)   Acid-base deficit 1.0  0.0 - 2.0 (mmol/L)   O2 Saturation 97.5     Patient temperature 98.6     Collection site LEFT RADIAL     Drawn by [id number]  Sample type ARTERIAL DRAW     Allens test (pass/fail) PASS  PASS   CBC     Status: Abnormal   Collection Time   03/23/11  5:54 AM      Component Value Range   WBC 8.0  4.0 - 10.5 (K/uL)   RBC 4.10 (*) 4.22 - 5.81 (MIL/uL)   Hemoglobin 13.4  13.0 - 17.0 (g/dL)   HCT 16.1  09.6 - 04.5 (%)   MCV 95.1  78.0 - 100.0 (fL)   MCH 32.7  26.0 - 34.0 (pg)   MCHC 34.4  30.0 - 36.0 (g/dL)   RDW 40.9  81.1 - 91.4 (%)   Platelets 176  150 - 400 (K/uL)  BASIC METABOLIC PANEL     Status: Abnormal   Collection Time   03/23/11  5:54 AM      Component Value Range   Sodium 139  135 - 145 (mEq/L)   Potassium 4.0  3.5 - 5.1 (mEq/L)   Chloride 109  96 - 112 (mEq/L)   CO2 22  19 - 32 (mEq/L)   Glucose, Bld 93  70 - 99 (mg/dL)   BUN 14  6 - 23 (mg/dL)   Creatinine, Ser 7.82  0.50 - 1.35 (mg/dL)   Calcium 9.0  8.4 - 95.6 (mg/dL)   GFR calc non Af Amer 62 (*) >90 (mL/min)   GFR calc Af Amer 72 (*) >90 (mL/min)  APTT     Status: Normal   Collection Time   03/23/11  5:54 AM      Component Value Range   aPTT 34  24 - 37 (seconds)    Studies/Results: No results found.  Medications:    . aminocaproic acid (AMICAR) for OHS   Intravenous To OR  . amLODipine  10 mg Oral Daily  . aspirin   325 mg Oral Daily  . atorvastatin  40 mg Oral q1800  . bisacodyl  5 mg Oral Once  . cefUROXime (ZINACEF)  IV  1.5 g Intravenous To OR  . cefUROXime (ZINACEF)  IV  750 mg Intravenous To OR  . chlorhexidine  60 mL Topical Once  . chlorhexidine  60 mL Topical Once  . chlorhexidine  60 mL Topical Once  . dexmedetomidine (PRECEDEX) IV infusion for high rates  0.1-0.7 mcg/kg/hr Intravenous To OR  . diazepam  5 mg Oral On Call  . DOPamine  2-20 mcg/kg/min Intravenous To OR  . epinephrine  0.5-20 mcg/min Intravenous To OR  . insulin (NOVOLIN-R) infusion   Intravenous To OR  . LORazepam  0.5 mg Oral BID  . magnesium sulfate  40 mEq Other To OR  . metoprolol tartrate  12.5 mg Oral Once  . metoprolol tartrate  37.5 mg Oral BID  . nitroGLYCERIN  0.4 mg Transdermal Daily  . nitroGLYCERIN  2-200 mcg/min Intravenous To OR  . nitroglycerin/verapamil/heparin/sodium bicarbonate solution irrigation for artery spasm   Irrigation To OR  . pantoprazole  40 mg Oral Q1200  . phenylephrine (NEO-SYNEPHRINE) Adult infusion  30-200 mcg/min Intravenous To OR  . potassium chloride  80 mEq Other To OR  . sodium chloride  3 mL Intravenous Q12H  . vancomycin  1,500 mg Intravenous To OR  . DISCONTD: chlorhexidine  60 mL Topical Once  . DISCONTD: chlorhexidine  60 mL Topical Once  . DISCONTD: chlorhexidine  60 mL Topical Once  . DISCONTD: diazepam  5 mg Oral Once  . DISCONTD: metoprolol tartrate  12.5 mg Oral Once  acetaminophen, acetaminophen, ALPRAZolam, ondansetron (ZOFRAN) IV, ondansetron, temazepam     Assessment/Plan:  Principal Problem:  *Chest pain: Status post cardiac cath, showed severe three-vessel native coronary artery disease  -  for CABG today  Active Problems:  TOBACCO ABUSE: Counseled strongly on tobacco cessation  HYPERTENSION: Controlled, continue present medications  CAROTID ARTERY DISEASE: History of carotid endarterectomy  DVT Prophylaxis: SCDs  Code Status: Full code    Disposition: Will discuss transfer of patient care after CABG  to CT surgery service with Dr Cornelius Moras .    LOS: 5 days   [name] 03/23/2011, 7:20 AM

## 2011-03-23 NOTE — Transfer of Care (Signed)
Immediate Anesthesia Transfer of Care Note  Patient: [name]  Procedure(s) Performed: Procedure(s) (LRB): CORONARY ARTERY BYPASS GRAFTING (CABG) (N/A)  Patient Location: SICU  Anesthesia Type: General  Level of Consciousness: Patient remains intubated per anesthesia plan  Airway & Oxygen Therapy: Patient remains intubated per anesthesia plan  Post-op Assessment: Report given to PACU RN  Post vital signs: Reviewed and stable  Complications: No apparent anesthesia complications

## 2011-03-23 NOTE — Interval H&P Note (Signed)
History and Physical Interval Note:  03/23/2011 3:53 PM  [name]  has presented today for surgery, with the diagnosis of CAD  The various methods of treatment have been discussed with the patient and family. After consideration of risks, benefits and other options for treatment, the patient has consented to  Procedure(s) (LRB): CORONARY ARTERY BYPASS GRAFTING (CABG) (N/A) as a surgical intervention .  The patients' history has been reviewed, patient examined, no change in status, stable for surgery.  I have reviewed the patients' chart and labs.  Questions were answered to the patient's satisfaction.     Alden Feagan C  Refer to comprehensive consultation note the computer will not allow me to link this update to that note

## 2011-03-23 NOTE — Anesthesia Postprocedure Evaluation (Signed)
  Anesthesia Post-op Note  Patient: [name]  Procedure(s) Performed: Procedure(s) (LRB): CORONARY ARTERY BYPASS GRAFTING (CABG) (N/A)  Patient Location: SICU  Anesthesia Type: General  Level of Consciousness: Patient remains intubated per anesthesia plan  Airway and Oxygen Therapy: Patient remains intubated per anesthesia plan  Post-op Pain: none  Post-op Assessment: Post-op Vital signs reviewed, Patient's Cardiovascular Status Stable and Respiratory Function Stable  Post-op Vital Signs: Reviewed and stable  Complications: No apparent anesthesia complications

## 2011-03-23 NOTE — Preoperative (Signed)
Beta Blockers   Reason not to administer Beta Blockers:Not Applicable 

## 2011-03-23 NOTE — Progress Notes (Signed)
Utilization Review Completed.[name] T3/11/2011   

## 2011-03-23 NOTE — Anesthesia Preprocedure Evaluation (Addendum)
Anesthesia Evaluation  Patient identified by MRN, date of birth, ID band Patient awake    Reviewed: Allergy & Precautions, H&P , NPO status , Patient's Chart, lab work & pertinent test results  History of Anesthesia Complications Negative for: history of anesthetic complications  Airway Mallampati: II  Neck ROM: Full    Dental  (+) Poor Dentition   Pulmonary  + rhonchi         Cardiovascular hypertension, Pt. on home beta blockers + angina + CAD Rhythm:Regular Rate:Normal     Neuro/Psych PSYCHIATRIC DISORDERS Anxiety Depression    GI/Hepatic GERD-  ,  Endo/Other    Renal/GU      Musculoskeletal   Abdominal (+) + obese,   Peds  Hematology   Anesthesia Other Findings   Reproductive/Obstetrics                          Anesthesia Physical Anesthesia Plan  ASA: III  Anesthesia Plan: General   Post-op Pain Management:    Induction: Intravenous  Airway Management Planned: Oral ETT  Additional Equipment: Arterial line, CVP and PA Cath  Intra-op Plan:   Post-operative Plan: Post-operative intubation/ventilation  Informed Consent: I have reviewed the patients History and Physical, chart, labs and discussed the procedure including the risks, benefits and alternatives for the proposed anesthesia with the patient or authorized representative who has indicated his/her understanding and acceptance.   Dental advisory given  Plan Discussed with: CRNA, Anesthesiologist and Surgeon  Anesthesia Plan Comments:         Anesthesia Quick Evaluation

## 2011-03-24 ENCOUNTER — Inpatient Hospital Stay (HOSPITAL_COMMUNITY): Payer: Medicare PPO

## 2011-03-24 LAB — POCT I-STAT, CHEM 8
Chloride: 106 mEq/L (ref 96–112)
Glucose, Bld: 113 mg/dL — ABNORMAL HIGH (ref 70–99)
HCT: 30 % — ABNORMAL LOW (ref 39.0–52.0)
Hemoglobin: 10.2 g/dL — ABNORMAL LOW (ref 13.0–17.0)
Potassium: 4.8 mEq/L (ref 3.5–5.1)
Sodium: 141 mEq/L (ref 135–145)

## 2011-03-24 LAB — CBC
HCT: 32.7 % — ABNORMAL LOW (ref 39.0–52.0)
HCT: 31 % — ABNORMAL LOW (ref 39.0–52.0)
Hemoglobin: 11.2 g/dL — ABNORMAL LOW (ref 13.0–17.0)
Hemoglobin: 10.4 g/dL — ABNORMAL LOW (ref 13.0–17.0)
MCH: 32.4 pg (ref 26.0–34.0)
MCH: 32.3 pg (ref 26.0–34.0)
MCHC: 33.5 g/dL (ref 30.0–36.0)
MCV: 94.5 fL (ref 78.0–100.0)
MCV: 96.3 fL (ref 78.0–100.0)
Platelets: 110 10*3/uL — ABNORMAL LOW (ref 150–400)
RBC: 3.46 MIL/uL — ABNORMAL LOW (ref 4.22–5.81)

## 2011-03-24 LAB — BASIC METABOLIC PANEL
BUN: 12 mg/dL (ref 6–23)
CO2: 19 mEq/L (ref 19–32)
Calcium: 8.1 mg/dL — ABNORMAL LOW (ref 8.4–10.5)
Chloride: 108 mEq/L (ref 96–112)
Creatinine, Ser: 0.94 mg/dL (ref 0.50–1.35)
Glucose, Bld: 138 mg/dL — ABNORMAL HIGH (ref 70–99)

## 2011-03-24 LAB — POCT I-STAT 3, ART BLOOD GAS (G3+)
Bicarbonate: 18.3 mEq/L — ABNORMAL LOW (ref 20.0–24.0)
pCO2 arterial: 34.7 mmHg — ABNORMAL LOW (ref 35.0–45.0)
pCO2 arterial: 36.1 mmHg (ref 35.0–45.0)
pH, Arterial: 7.332 — ABNORMAL LOW (ref 7.350–7.450)
pH, Arterial: 7.326 — ABNORMAL LOW (ref 7.350–7.450)
pO2, Arterial: 145 mmHg — ABNORMAL HIGH (ref 80.0–100.0)
pO2, Arterial: 110 mmHg — ABNORMAL HIGH (ref 80.0–100.0)

## 2011-03-24 LAB — CREATININE, SERUM: GFR calc Af Amer: 63 mL/min — ABNORMAL LOW (ref >90)

## 2011-03-24 LAB — GLUCOSE, CAPILLARY
Glucose-Capillary: 104 mg/dL — ABNORMAL HIGH (ref 70–99)
Glucose-Capillary: 109 mg/dL — ABNORMAL HIGH (ref 70–99)
Glucose-Capillary: 119 mg/dL — ABNORMAL HIGH (ref 70–99)
Glucose-Capillary: 122 mg/dL — ABNORMAL HIGH (ref 70–99)
Glucose-Capillary: 132 mg/dL — ABNORMAL HIGH (ref 70–99)
Glucose-Capillary: 95 mg/dL (ref 70–99)

## 2011-03-24 MED ORDER — POTASSIUM CHLORIDE 10 MEQ/50ML IV SOLN
10.0000 meq | INTRAVENOUS | Status: AC
  Administered 2011-03-24 (×2): 10 meq via INTRAVENOUS

## 2011-03-24 MED ORDER — FUROSEMIDE 10 MG/ML IJ SOLN
40.0000 mg | Freq: Once | INTRAMUSCULAR | Status: AC
  Administered 2011-03-24: 40 mg via INTRAVENOUS
  Filled 2011-03-24: qty 4

## 2011-03-24 MED ORDER — TRAMADOL HCL 50 MG PO TABS
50.0000 mg | ORAL_TABLET | Freq: Four times a day (QID) | ORAL | Status: DC | PRN
  Administered 2011-03-25 – 2011-03-30 (×14): 50 mg via ORAL
  Filled 2011-03-24 (×14): qty 1

## 2011-03-24 MED ORDER — LORAZEPAM 0.5 MG PO TABS
0.5000 mg | ORAL_TABLET | Freq: Two times a day (BID) | ORAL | Status: DC | PRN
  Administered 2011-03-24 – 2011-03-31 (×6): 0.5 mg via ORAL
  Filled 2011-03-24 (×6): qty 1

## 2011-03-24 MED ORDER — SIMVASTATIN 20 MG PO TABS
20.0000 mg | ORAL_TABLET | Freq: Every day | ORAL | Status: DC
  Administered 2011-03-24 – 2011-03-31 (×8): 20 mg via ORAL
  Filled 2011-03-24 (×9): qty 1

## 2011-03-24 MED ORDER — LACTATED RINGERS IV SOLN: INTRAVENOUS | Status: DC

## 2011-03-24 MED ORDER — INSULIN GLARGINE 100 UNIT/ML ~~LOC~~ SOLN
25.0000 [IU] | Freq: Every day | SUBCUTANEOUS | Status: DC
  Administered 2011-03-24 – 2011-03-25 (×2): 25 [IU] via SUBCUTANEOUS

## 2011-03-24 MED ORDER — INSULIN ASPART 100 UNIT/ML ~~LOC~~ SOLN: 0.0000 [IU] | SUBCUTANEOUS | Status: DC

## 2011-03-24 MED ORDER — INSULIN ASPART 100 UNIT/ML ~~LOC~~ SOLN
0.0000 [IU] | SUBCUTANEOUS | Status: DC
  Administered 2011-03-24 – 2011-03-25 (×4): 2 [IU] via SUBCUTANEOUS

## 2011-03-24 MED ORDER — SODIUM CHLORIDE 0.9 % IJ SOLN: 10.0000 mL | INTRAMUSCULAR | Status: DC | PRN

## 2011-03-24 MED ORDER — ALBUMIN HUMAN 5 % IV SOLN
12.5000 g | Freq: Once | INTRAVENOUS | Status: AC
  Administered 2011-03-24: 12.5 g via INTRAVENOUS

## 2011-03-24 MED ORDER — ACETAMINOPHEN 160 MG/5ML PO SOLN
975.0000 mg | Freq: Four times a day (QID) | ORAL | Status: AC
  Filled 2011-03-24: qty 40.6

## 2011-03-24 MED ORDER — INSULIN ASPART 100 UNIT/ML ~~LOC~~ SOLN
0.0000 [IU] | SUBCUTANEOUS | Status: AC
  Administered 2011-03-24 (×3): 2 [IU] via SUBCUTANEOUS

## 2011-03-24 MED ORDER — ACETAMINOPHEN 500 MG PO TABS
1000.0000 mg | ORAL_TABLET | Freq: Four times a day (QID) | ORAL | Status: AC
  Administered 2011-03-24 – 2011-03-28 (×18): 1000 mg via ORAL
  Filled 2011-03-24 (×17): qty 2

## 2011-03-24 MED FILL — Potassium Chloride Inj 2 mEq/ML: INTRAVENOUS | Qty: 40 | Status: AC

## 2011-03-24 MED FILL — Verapamil HCl IV Soln 2.5 MG/ML: INTRAVENOUS | Qty: 4 | Status: AC

## 2011-03-24 MED FILL — Magnesium Sulfate Inj 50%: INTRAMUSCULAR | Qty: 10 | Status: AC

## 2011-03-24 MED FILL — Heparin Sodium (Porcine) Inj 1000 Unit/ML: INTRAMUSCULAR | Qty: 10 | Status: AC

## 2011-03-24 MED FILL — Nitroglycerin IV Soln 5 MG/ML: INTRAVENOUS | Qty: 10 | Status: AC

## 2011-03-24 MED FILL — Lactated Ringer's Solution: INTRAVENOUS | Qty: 500 | Status: AC

## 2011-03-24 NOTE — Procedures (Signed)
Extubation Procedure Note  Patient Details:   Name: [name] DOB: 1940/10/16 MRN: [id number]   Airway Documentation:  Airway 8 mm (Active)  Secured at (cm) 22 cm 03/24/2011 12:03 AM  Measured From Lips 03/24/2011 12:03 AM  Secured Location Right 03/24/2011 12:03 AM  Secured By Caron Presume Tape 03/24/2011 12:03 AM    Evaluation  O2 sats: stable throughout Complications: No apparent complications Patient did tolerate procedure well. Bilateral Breath Sounds: Clear;Diminished   Yes  Pt extubated and placed on 4L [place]. Prior to extubation NIF -30, VC 1L. Pt tolerating well, RT will monitor.   Driscilla Grammes 03/24/2011, 2:20 AM

## 2011-03-24 NOTE — Progress Notes (Signed)
1 Day Post-Op Procedure(s) (LRB): CORONARY ARTERY BYPASS GRAFTING (CABG) (N/A) Subjective: C/o hiccups and mild incisional pain  Objective: Vital signs in last 24 hours: Temp:  [96.4 F (35.8 C)-100.4 F (38 C)] 100.4 F (38 C) (03/12 0800) Pulse Rate:  [57-89] 80  (03/12 0800) Cardiac Rhythm:  [-] Atrial paced (03/12 0800) Resp:  [12-25] 21  (03/12 0800) BP: (98-116)/(51-73) 99/53 mmHg (03/12 0800) SpO2:  [98 %-100 %] 99 % (03/12 0800) Arterial Line BP: (77-131)/(56-74) 77/66 mmHg (03/12 0800) FiO2 (%):  [39.7 %-50.3 %] 40.2 % (03/12 0200) Weight:  [198 lb 3.1 oz (89.9 kg)-207 lb 0.2 oz (93.9 kg)] 207 lb 0.2 oz (93.9 kg) (03/12 0500)  Hemodynamic parameters for last 24 hours: PAP: (23-36)/(4-17) 33/15 mmHg CO:  [3.3 L/min-5.6 L/min] 5.6 L/min CI:  [1.5 L/min/m2-2.6 L/min/m2] 2.6 L/min/m2  Intake/Output from previous day: 03/11 0701 - 03/12 0700 In: 4910.1 [P.O.:120; I.V.:3320.1; Blood:390; NG/GT:30; IV Piggyback:1050] Out: 3619 [Urine:3085; Emesis/NG output:250; Chest Tube:284] Intake/Output this shift: Total I/O In: 40 [I.V.:40] Out: 125 [Urine:125]  General appearance: alert and no distress Neurologic: intact Heart: regular rate and rhythm Lungs: diminished breath sounds bibasilar  Lab Results:  Basename 03/24/11 0320 03/23/11 2005 03/23/11 2000  WBC 10.0 -- 9.6  HGB 11.2* 11.2* --  HCT 32.7* 33.0* --  PLT 110* -- 104*   BMET:  Basename 03/24/11 0320 03/23/11 2005 03/23/11 0554  NA 138 139 --  K 4.3 4.1 --  CL 108 -- 109  CO2 19 -- 22  GLUCOSE 138* 129* --  BUN 12 -- 14  CREATININE 0.94 -- 1.17  CALCIUM 8.1* -- 9.0    PT/INR:  Basename 03/23/11 2000  LABPROT 18.0*  INR 1.46   ABG    Component Value Date/Time   PHART 7.326* 03/24/2011 0320   HCO3 18.8* 03/24/2011 0320   TCO2 20 03/24/2011 0320   ACIDBASEDEF 6.0* 03/24/2011 0320   O2SAT 98.0 03/24/2011 0320   CBG (last 3)   Basename 03/24/11 0741 03/24/11 0620 03/24/11 0157  GLUCAP 132* 135* 123*     Assessment/Plan: S/P Procedure(s) (LRB): CORONARY ARTERY BYPASS GRAFTING (CABG) (N/A) -POD # 1 CABG x 4 CV- stable, d/c swan' RESP- pulmonary toilet RENAL- lytes, creatinine OK Anemia- secondary to ABL, mild, follow CBGs well controlled D/c CT mobilize   LOS: 6 days    Telena Peyser C 03/24/2011

## 2011-03-24 NOTE — Progress Notes (Signed)
The patient is postop day 1 multivessel bypass grafting. The patient is hemodynamically stable but slightly uncomfortable. Due to some altered mental status with narcotics we'll start oral tramadol. Patient is currently oriented x3. Oxygen saturation normal. Evening labs satisfactory. Hematocrit 31 potassium 4.8 BUN 13 creatinine 1.3 glucose 113

## 2011-03-24 NOTE — Op Note (Signed)
NAMEYIDEL, TEUSCHER NO.:  [id number]  MEDICAL RECORD NO.:  [id number]  LOCATION:  2313                         FACILITY:  [facility]  PHYSICIAN:  Salvatore Decent. 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  PROCEDURE:  Median sternotomy, extracorporeal circulation, coronary artery bypass grafting x4 (left internal mammary artery to left anterior descending artery, saphenous vein graft to first diagonal, saphenous vein graft to acute marginal, saphenous vein graft to obtuse marginal 3), endoscopic vein harvest, right leg and left thigh.  SURGEON:  Salvatore Decent. Dorris Fetch, MD  ASSISTANT:  Rowe Clack, PA-C  ANESTHESIA:  General.  FINDINGS:  Vein and mammary artery good quality, good-quality targets. Long segment of saphenous vein from lower thigh and upper calf on right leg unusable; therefore, additional vein harvested from the left thigh.  Transient hypotension associated with protamine administration, responded to volume administration.  No change in pulmonary arterial pressures.  CLINICAL INDICATION:  Mr. Wisehart is a 71 year old gentleman who presents with a 4-week history of chest pain, this has had a crescendo- type pattern.  He was admitted and had cardiac catheterization where he was found to have severe three-vessel coronary artery disease.  He was advised to undergo coronary artery bypass grafting.  The indications, risks, benefits, and alternatives were discussed in detail with the patient.  He understood and accepted the risks and agreed to proceed.  OPERATIVE NOTE:  Mr. Mcnellis was brought to the preop holding area on March 23, 2011, there, the Anesthesia Service placed a  Swan-Ganz catheter and arterial blood pressure monitoring line, and intravenous antibiotics were administered.  He was taken to the operating room, anesthetized, and intubated.  A Foley catheter was placed.  The chest, abdomen, and legs were prepped and draped in usual sterile fashion.  An incision was made in the medial aspect of the right leg at the level of the knee.  The greater saphenous vein was identified and was harvested endoscopically from groin to midcalf.  A 2000 units of heparin was administered during the vessel harvest.  Simultaneously with the vein harvest, a median sternotomy was performed and the left internal mammary artery was harvested using standard technique.  It was a good- quality conduit.  The patient was fully heparinized.  At this point, the vein was out of the leg and could be inspected. There was a long segment of the vein which was not suitable for use as a bypass graft.  Therefore, additional vein was harvested from the left thigh, this was of better quality and was suitable for use as was one segment from the upper right thigh, all vein that was utilized was good quality. The pericardium was opened after confirming adequate anticoagulation with ACT measurement.  The aorta was cannulated via concentric 2-0 Ethibond pledgeted pursestring sutures.  A dual-stage venous cannula was placed via pursestring suture in the right atrial appendage. Cardiopulmonary bypass was instituted and the patient was cooled to 32 degrees Celsius.  The  coronary arteries were inspected and anastomotic sites were chosen.  The distal right coronary artery was very small and the acute marginal, which was in communication with the distal right coronary artery, it was a much more suitable target for grafting. Therefore, it was elected to place the vein to the acute marginal.  The remaining targets were good quality.  The conduits were inspected and cut to length.  A foam pad was placed  in the pericardium to insulate the heart and protect the left phrenic nerve.  A temperature probe was placed in myocardial septum and a cardioplegic cannula was placed in the ascending aorta.  The aorta was crossclamped.  The left ventricle was emptied via the aortic root vent.  Cardiac arrest then was achieved with a combination of cold antegrade blood cardioplegia and topical iced saline.  One liter of cardioplegia was administered.  The myocardial septal temperature fell to 9 degrees Celsius.  The following distal anastomoses were performed.  First, a reversed saphenous vein graft was placed end-to-side to the acute marginal branch of the right coronary artery, this was a 1.5-mm good-quality target.  The vein was of good quality.  All anastomoses were probed proximally and distally at their completion to ensure patency.  Cardioplegia was administered down each vein graft at its completion to assess flow and hemostasis.  Next, a reversed saphenous vein graft was placed end-to-side to the first diagonal branch of the LAD.  This was a 1.5-mm target vessel, was diffusely diseased with 60-70% stenoses proximally, was free of disease beyond the distal.  The anastomosis was again performed with a running 7- 0 Prolene suture.  Next, a reversed saphenous vein graft was placed end-to-side to obtuse marginal 3.  This vessel had a 99% ostial stenosis.  It was thick walled, but still good quality and 1.5-mm diameter.  At the site of the anastomosis, the vein graft again was of good quality.  Next, the left internal mammary artery was brought through a window in the pericardium.  The distal end was beveled.  It was a 2-mm good quality conduit, it was anastomosed end-to-side to the LAD.  The LAD was a 2-mm target at the site of the anastomosis.  There was some posterior plaquing throughout the vessel, but a probe passed easily proximally and distally from the anastomosis.  The mammary to LAD  anastomosis was done with a running 8-0 Prolene suture.  At the completion of the anastomosis, the bulldog clamp was briefly removed from the mammary artery to inspect for hemostasis.  Immediate rapid septal rewarming was noted.  The bulldog clamp was replaced and the mammary pedicle was tacked to the epicardial surface of the heart with 6-0 Prolene sutures.  Additional cardioplegia was administered.  The vein grafts were cut to length.  The proximal vein graft anastomoses were performed to 4.5-mm punch aortotomies with running 6-0 Prolene sutures.  At the completion of the final proximal anastomosis, the patient was placed in Trendelenburg position.  Lidocaine was administered.  The bulldog clamp was removed from the left mammary artery.  The aortic root was de-aired and the aortic crossclamp was removed.  The total crossclamp time was 67 minutes.  The patient's spontaneously resumed sinus rhythm did not require defibrillation.  While rewarming was completed, all proximal and distal anastomoses were inspected for hemostasis.  Epicardial pacing wires were placed on the right ventricle and right atrium.  When the patient had rewarmed to a core temperature of 37 degrees Celsius, he was  weaned from cardiopulmonary bypass on the first attempt without difficulty.  The total bypass time was 101 minute.  The initial cardiac index was greater than 2 liter/minute/meter squared.  A test dose of protamine was administered, and initially, it was well tolerated.  Protamine administration continued.  The patient did develop systemic vasodilatation with hypotension.  Cardiac function appeared to remain stable and pulmonary artery pressures did not change.  The patient responded to volume administration and otherwise was stable. There was a second set transient drop in blood pressure as the protamine was completed.  The atrial and aortic cannulae were removed.  The remainder of protamine was  administered.  The chest was irrigated with warm saline.  Hemostasis was achieved.  The pericardium was reapproximated with interrupted 3-0 silk sutures, it came together without tension or without kinking underlying grafts.  The patient tolerated this well hemodynamically.  The left pleural and single mediastinal chest tubes were placed as a separate subcostal incisions. The sternum was closed with interrupted single and double heavy gauge stainless steel wires.  The pectoralis fascia, subcutaneous tissue, and skin were closed in standard fashion.  All sponge, needle, and instrument counts were correct at the end of the procedure.  The patient was taken from the operating room to the surgical intensive care unit in good condition.     Salvatore Decent Dorris Fetch, M.D.     SCH/MEDQ  D:  03/23/2011  T:  03/24/2011  Job:  [id number]

## 2011-03-24 NOTE — Progress Notes (Signed)
Mr. [name] is displaying some confusion, visual hallucinations (mice on the ceiling and being in a bowling alley),  and high anxiety in regards to his surgery and his 68 year old mother. Phone call made to his significant other, Marchelle Folks to have her talk to him and reassure him--was somewhat reassured. Is experiencing an increase in pain, likely due to his anxiety. Takes Ativan 0.5 mg at home twice a a day, which was restarted earlier but a dose was not given yet. Dr. Donata Clay made aware of his mental status and pain--Ultram ordered. Will give dose of Ativan now for anxiety and continue to assess for changes.

## 2011-03-24 NOTE — Progress Notes (Signed)
Extremely difficult getting [name] back to bed from the chair this evening due to limited baseline mobility and his pain. Was able to stand with maximal assist with 2 RN's, however was not able to stand upright nor move his feet. Had to place back into the chair so we could then pivot him, with great difficulty, to the bed. He states that he "walked well with a walker at home" before his surgery. Was unable to ambulate in the room or the hallway today.

## 2011-03-24 NOTE — Progress Notes (Signed)
[name] is starting to wake up from anesthesia. Does follow some simple commands, however falls back asleep quickly. Cannot lift head of pillow. Will continue to assess for readiness to wean.

## 2011-03-25 ENCOUNTER — Inpatient Hospital Stay (HOSPITAL_COMMUNITY): Payer: Medicare PPO

## 2011-03-25 ENCOUNTER — Encounter (HOSPITAL_COMMUNITY): Payer: Self-pay | Admitting: Thoracic Surgery (Cardiothoracic Vascular Surgery)

## 2011-03-25 LAB — CBC
HCT: 32.3 % — ABNORMAL LOW (ref 39.0–52.0)
Hemoglobin: 10.9 g/dL — ABNORMAL LOW (ref 13.0–17.0)
MCV: 97.6 fL (ref 78.0–100.0)
RBC: 3.31 MIL/uL — ABNORMAL LOW (ref 4.22–5.81)
RDW: 12.8 % (ref 11.5–15.5)
WBC: 11.9 10*3/uL — ABNORMAL HIGH (ref 4.0–10.5)

## 2011-03-25 LAB — BASIC METABOLIC PANEL
CO2: 24 mEq/L (ref 19–32)
Chloride: 104 mEq/L (ref 96–112)
Creatinine, Ser: 1.22 mg/dL (ref 0.50–1.35)
GFR calc Af Amer: 69 mL/min — ABNORMAL LOW (ref >90)
Potassium: 4.2 mEq/L (ref 3.5–5.1)
Sodium: 136 mEq/L (ref 135–145)

## 2011-03-25 LAB — GLUCOSE, CAPILLARY
Glucose-Capillary: 81 mg/dL (ref 70–99)
Glucose-Capillary: 114 mg/dL — ABNORMAL HIGH (ref 70–99)
Glucose-Capillary: 141 mg/dL — ABNORMAL HIGH (ref 70–99)

## 2011-03-25 MED ORDER — ENOXAPARIN SODIUM 40 MG/0.4ML ~~LOC~~ SOLN
40.0000 mg | Freq: Every day | SUBCUTANEOUS | Status: DC
  Administered 2011-03-25 – 2011-03-27 (×3): 40 mg via SUBCUTANEOUS
  Filled 2011-03-25 (×4): qty 0.4

## 2011-03-25 MED ORDER — FUROSEMIDE 40 MG PO TABS
40.0000 mg | ORAL_TABLET | Freq: Every day | ORAL | Status: DC
  Administered 2011-03-25 – 2011-04-01 (×8): 40 mg via ORAL
  Filled 2011-03-25 (×8): qty 1

## 2011-03-25 MED ORDER — POTASSIUM CHLORIDE CRYS ER 20 MEQ PO TBCR
20.0000 meq | EXTENDED_RELEASE_TABLET | Freq: Two times a day (BID) | ORAL | Status: DC
  Administered 2011-03-25 – 2011-04-01 (×15): 20 meq via ORAL
  Filled 2011-03-25 (×17): qty 1

## 2011-03-25 MED ORDER — POLYETHYLENE GLYCOL 3350 17 G PO PACK
17.0000 g | PACK | Freq: Every day | ORAL | Status: DC
  Administered 2011-03-25: 17 g via ORAL
  Filled 2011-03-25 (×2): qty 1

## 2011-03-25 NOTE — Progress Notes (Signed)
Patient still showing some evidence of hallucinations, however are not nearly as frequent as the prior night. Claims to see birds at times in the room. He is still confused as to where he is and what time it is. "Feels as if it is somewhere around Christmas time" and he claims that he is not in the same room as before. Attempts to reorient are easier and he does know he is "mixed up." Call made to his significant other, Mandy to allow her to provide some verbal reassurance to him. PRN dose of Ativan given, which he claims helps him not to see the "birds and bugs" in his room. Bed alarm on and door open for observation and patient safety.

## 2011-03-25 NOTE — Progress Notes (Signed)
While attempting to draw blood back from sleeve for AM lab work, sleeve has become dysfunctional and will not flush or draw back. Will pull sleeve out and start a new PIV. Lab notified of need to come to room to do lab work.

## 2011-03-25 NOTE — Progress Notes (Signed)
[facility] BRIEF SICU PROGRESS NOTE  2 Days Post-Op  S/P Procedure(s) (LRB): CORONARY ARTERY BYPASS GRAFTING (CABG) (N/A)   Stable day  Plan: Continue current plan  Nevah Dalal H 03/25/2011 6:45 PM

## 2011-03-25 NOTE — Progress Notes (Signed)
   CARE MANAGEMENT NOTE 03/25/2011  Patient:  [name], [name]   Account Number:  [id number]  Date Initiated:  03/19/2011  Documentation initiated by:  Junius Creamer  Subjective/Objective Assessment:   adm w ch pain, pos card cath for cvts consult     Action/Plan:   lives w fam, pcp dr Drue Novel   Anticipated DC Date:  03/30/2011   Anticipated DC Plan:  SKILLED NURSING FACILITY  In-house referral  Clinical Social Worker      DC Planning Services  CM consult      Choice offered to / List presented to:             Status of service:  In process, will continue to follow Medicare Important Message given?   (If response is "NO", the following Medicare IM given date fields will be blank) Date Medicare IM given:   Date Additional Medicare IM given:    Discharge Disposition:    Per UR Regulation:    If discussed at Long Length of Stay Meetings, dates discussed:    Comments:  03/25/11 Meribeth Vitug,RN,BSN 1533 S/P CABG X 4 ON 03/24/11.  PTA, PT INDEPENDENT, LIVES WITH 91 YO MOTHER ON THE SECOND FLOOR OF HOME.  PMH OF RECENT HIP REPLACEMENT, PER RN.    PT AND OT CONSULT DONE TODAY, RECOMMENDATION IS FOR SNF.  WILL CONSULT CSW TO FACILITATE DC TO SNF FOR SHORT TERM REHAB WHEN MEDICALLY STABLE FOR DISCHARGE. Phone #[phone]   3/7 debbie dowell rn,bsn [phone]

## 2011-03-25 NOTE — Progress Notes (Signed)
2 Days Post-Op Procedure(s) (LRB): CORONARY ARTERY BYPASS GRAFTING (CABG) (N/A) Subjective: C/o incisional pain and constipation  Objective: Vital signs in last 24 hours: Temp:  [98.5 F (36.9 C)-100.4 F (38 C)] 99.2 F (37.3 C) (03/13 0400) Pulse Rate:  [79-94] 88  (03/13 0700) Cardiac Rhythm:  [-] Normal sinus rhythm (03/13 0500) Resp:  [18-26] 26  (03/13 0700) BP: (90-140)/(44-76) 140/68 mmHg (03/13 0700) SpO2:  [91 %-99 %] 95 % (03/13 0700) Arterial Line BP: (77-79)/(59-66) 79/59 mmHg (03/12 0900) Weight:  [206 lb 2.1 oz (93.5 kg)] 206 lb 2.1 oz (93.5 kg) (03/13 0500)  Hemodynamic parameters for last 24 hours: PAP: (31-33)/(15) 31/15 mmHg CO:  [5.6 L/min] 5.6 L/min CI:  [2.6 L/min/m2] 2.6 L/min/m2  Intake/Output from previous day: 03/12 0701 - 03/13 0700 In: 1393 [P.O.:720; I.V.:473; IV Piggyback:200] Out: 1615 [Urine:1565; Chest Tube:50] Intake/Output this shift:    General appearance: alert and no distress Heart: regular rate and rhythm Lungs: diminished breath sounds bibasilar Abdomen: abnormal findings:  soft, nontender, minimal BS  Lab Results:  Basename 03/25/11 0513 03/24/11 1623  WBC 11.9* 12.3*  HGB 10.9* 10.4*10.2*  HCT 32.3* 31.0*30.0*  PLT 125* 126*   BMET:  Basename 03/25/11 0513 03/24/11 1623 03/24/11 0320  NA 136 141 --  K 4.2 4.8 --  CL 104 106 --  CO2 24 -- 19  GLUCOSE 104* 113* --  BUN 14 13 --  CREATININE 1.22 1.311.30 --  CALCIUM 8.8 -- 8.1*    PT/INR:  Basename 03/23/11 2000  LABPROT 18.0*  INR 1.46   ABG    Component Value Date/Time   PHART 7.326* 03/24/2011 0320   HCO3 18.8* 03/24/2011 0320   TCO2 20 03/24/2011 1623   ACIDBASEDEF 6.0* 03/24/2011 0320   O2SAT 98.0 03/24/2011 0320   CBG (last 3)   Basename 03/25/11 0013 03/24/11 2015 03/24/11 1601  GLUCAP 111* 103* 122*    Assessment/Plan: S/P Procedure(s) (LRB): CORONARY ARTERY BYPASS GRAFTING (CABG) (N/A) - CV- stable RESP- pulmonary toilet RENAL- lytes,  creatinine OK- diurese Mobility issues- date to previous hip surgery, uses walker at home, will consult PT Neuro- some mild hallucinations last night, no issues this AM   LOS: 7 days    Shayanna Thatch C 03/25/2011

## 2011-03-25 NOTE — Evaluation (Signed)
Physical Therapy Evaluation Patient Details Name: [name] MRN: [id number] DOB: Feb 26, 1940 Today's Date: 03/25/2011  Problem List:  Patient Active Problem List  Diagnoses  . PANIC ATTACK  . ERECTILE DYSFUNCTION  . ALCOHOL ABUSE  . TOBACCO ABUSE  . DEPRESSION  . HYPERTENSION  . CAROTID ARTERY DISEASE  . GERD  . BARRETTS ESOPHAGUS  . OSTEOARTHRITIS  . FATIGUE  . GAIT DISTURBANCE  . COLONIC POLYPS, BENIGN, HX OF  . BENIGN PROSTATIC HYPERTROPHY, HX OF  . Hyperlipidemia  . General medical examination  . Chest pain    Past Medical History:  Past Medical History  Diagnosis Date  . Carotid arterial disease     s/p RCEA 11/2008 (Note: hx of negative cardiac nuc 03/2009)  . Hypertension   . Colonic polyp     cscopes 08/18/05 tics,polyps.Marland Kitchenall hyperplastic  . GERD (gastroesophageal reflux disease)     Barrets Esop. (per bx 08/2005) and HH f/u Dr.Schooler  . Anal warts   . Anal fissure   . Hemorrhoids   . 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 Panic attack   . Osteoarthritis   . Depression   . H/O ETOH abuse     Former. Quit ~2012   Past Surgical History:  Past Surgical History  Procedure Date  . Right carotid endartectomy with dacron patch angioplasty 11/2008  . Appendectomy   . Knee surgery   . Foot surgery     fx  . Total hip arthroplasty 2005    R due to OA  . Skin tags excised   . Transurethral resection of prostate   . Cataract extraction     wears contact lens L, good vision  . 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 Mcqueary is s/p CABG POD #2. Right hip replacement approx 6 mo ago but was ambulating  well (per pt with RW at home). Has had significant difficulty mobilizing with nursing staff so PT was consulted. PT eval complete and pt demonstrating difficulty with motor control/planning especially in standing pt with almost freezing episodes, difficulty advancing lower extremities reciprocally for ambulation needing significant facilitation to ambulate 4 steps today. This in addition to the below problem list will be addressed during physical therapy while in the acute setting. Rec SNF for follow up therapies.  PT Recommendation/Assessment: Patient will need skilled PT in the acute care venue PT Problem List: Decreased activity tolerance;Decreased balance;Decreased mobility;Decreased coordination;Decreased safety awareness;Pain;Decreased cognition Barriers to Discharge: Inaccessible home environment;Decreased caregiver support PT Therapy Diagnosis : Difficulty walking;Abnormality of gait;Acute pain;Altered mental status PT Plan PT Frequency: Min 3X/week PT Treatment/Interventions: DME instruction;Gait training;Functional mobility training;Therapeutic exercise;Therapeutic activities;Balance training;Neuromuscular re-education;Cognitive remediation;Patient/family education PT Recommendation Recommendations for Other Services: OT consult Follow Up Recommendations: Skilled nursing facility Equipment Recommended: Defer to next venue PT Goals  Acute Rehab PT Goals PT Goal Formulation: With patient Time For Goal Achievement: 2 weeks Pt will Roll Supine to Right Side: with supervision PT Goal: Rolling Supine to Right Side - Progress: Goal set today Pt will Roll Supine to Left Side: with supervision PT Goal: Rolling Supine to Left Side - Progress: Goal set today Pt will go Supine/Side to Sit: with supervision  PT Goal: Supine/Side to Sit - Progress: Goal set today Pt will Sit at [facility] of Bed: with modified independence PT Goal: Sit at [facility] Of Bed - Progress: Goal set today Pt will go Sit to  Supine/Side: with supervision PT Goal: Sit to Supine/Side - Progress: Goal set today Pt will go Sit to Stand: with min assist PT Goal: Sit to Stand - Progress: Goal set today Pt will go Stand to Sit: with min assist PT Goal: Stand to Sit - Progress: Goal set today Pt will Transfer Bed to Chair/Chair to Bed: with min assist PT Transfer Goal: Bed to Chair/Chair to Bed - Progress: Goal set today Pt will Stand: 3 - 5 min;with bilateral upper extremity support;with modified independence PT Goal: Stand - Progress: Goal set today Pt will Ambulate: 16 - 50 feet;with min assist;with least restrictive assistive device PT Goal: Ambulate - Progress: Goal set today Pt will Perform Home Exercise Program: Independently PT Goal: Perform Home Exercise Program - Progress: Goal set today  PT Evaluation Precautions/Restrictions  Precautions Precautions: Fall;Sternal Precaution Comments: 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 precautions Prior Functioning  Home Living Lives With: Family (50 y/o mother) Dolores Lory Help From: Friend(s) (couldn't get an idea of who this friend was) Type of Home: House (town house) Home Layout: Two level;Bed/bath upstairs Alternate Level Stairs-Number of Steps: full flight but patient reports chair lift for the stairs Home Access: Stairs to enter Entrance Stairs-Rails: Right Entrance Stairs-Number of Steps: 7-8 steps Home Adaptive Equipment: Walker - rolling;Bedside commode/3-in-1 Prior Function Level of Independence: Independent with basic ADLs;Requires assistive device for independence Driving: No Comments: Cares for his 60 y/o mother at home; she is currently being taken care of by live in nursing staff? Cognition Cognition Arousal/Alertness: Awake/alert Overall Cognitive Status: Impaired Attention: Impaired Current Attention Level: Selective Orientation Level: Oriented to person;Oriented to place;Oriented to  situation Following Commands: Follows one step commands consistently;Follows multi-step commands inconsistently Safety/Judgement: Decreased awareness of safety precautions Decreased Safety/Judgement: Decreased awareness of need for assistance;Impulsive Safety/Judgement - Other Comments: pt attempting to get out of the recliner alone despite having just transferred to chair needing +2total assistance for safety  Awareness of Deficits: Decreased awareness of deficits Problem Solving: Requires assistance for problem solving Cognition - Other Comments: Pt hallucinating multiple times during session swearing that he sees ants moving on the floor despite reassurance that they were not there; assist to problem solve ambulation (weight shift and advancing limbs reciprocally), slow to process especially with concerns to verbal commands for movement Sensation/Coordination Sensation Light Touch: Appears Intact Coordination Gross Motor Movements are Fluid and Coordinated: No Fine Motor Movements are Fluid and Coordinated: No Coordination and Movement Description: pt tends to freeze with mobility, very stiff and rigidly extended in standing with difficulty separating knee and hip flexion for ambulation; very stiff trunk posture as well, decreased trunk rotation during gait Extremity Assessment RUE Assessment RUE Assessment: Within Functional Limits LUE Assessment LUE Assessment: Within Functional Limits RLE Assessment RLE Assessment:  (strong at least 4/5 but difficulty with motor planning?) LLE Assessment LLE Assessment:  (see RLE) Mobility (including Balance) Bed Mobility Bed Mobility: Yes Rolling Left: 3: Mod assist Rolling Left Details (indicate cue type and reason): cues for sequencing (initiating knee flexion) use of pad to bring pt into rotation and pt able to contine with modA and faciliation with pad Left Sidelying to Sit: 1: +2 Total assist Left Sidelying to Sit Details (indicate cue type  and reason): verbal cues for legs to come off the bed, pt slow to process but able to initiate this, minA to completely get legs off bed; +2totalpt40% to bring trunk upright  Sitting - Scoot to Edge of Bed: 3: Mod assist Sitting - Scoot to Edge of Bed Details (indicate cue type and reason): use of pad to scoot right hip anteriorly Transfers Transfers: Yes Sit to Stand: 1: +2 Total assist;Without upper extremity assist;From bed;From chair/3-in-1 (50%) Sit to Stand Details (indicate cue type and reason): cues for safe hand placement; facilitation and v/c's for sequencing especially placement of feet and anterior translation of trunk over BOS Stand to Sit: 1: +2 Total assist (20%) Stand to Sit Details: max facilitation for hip/knee flexion and to slowly lower body to chair; pt just extends and falls posteriorly with no righting reaction Stand Pivot Transfers: 1: +2 Total assist (50%) Stand Pivot Transfer Details (indicate cue type and reason): attempted SPT but pt unable to actually pivot feet (difficulty weight shifting for foot placement) so just tried ambulating anteriorly Ambulation/Gait Ambulation/Gait: Yes Ambulation/Gait Assistance: 1: +2 Total assist (60%) Ambulation/Gait Assistance Details (indicate cue type and reason): pt standing OK statically, slight posterior lean but with great difficulty isolating hip and knee flexion to advance leg for step; initially pt basically waddling foward but with mod-max manual faciliation at pt's hips we were able to get some hip/trunk rotation in combination with weight shifting for slight step advancement; it is almost parkinsonian in a way as the pt was frozen weight shifting laterally but unable to advance forward; in an effort to isolate hip/knee flexion attempted marching but pt just waddled and then attempted mini-squat which pt was able to do Ambulation Distance (Feet): 4 Feet Assistive device: Rolling walker Gait Pattern: Decreased hip/knee flexion -  left;Decreased hip/knee flexion - right;Decreased stride length Stairs: No  Static Sitting Balance Static Sitting - Balance Support: Bilateral upper extremity supported;Feet unsupported Static Sitting - Level of Assistance: 4: Min assist Static Sitting - Comment/# of Minutes: initially pt falling posteriorly but once seated for a few minutes pt able to sit mingaurdA-minA but pt needing cues to extend hip to place feet on the floor as he tends to hold in a flexed position especially on the right Static Standing Balance Static Standing - Balance Support: Bilateral upper extremity supported Static Standing - Level of Assistance: 3: Mod assist Static Standing - Comment/# of Minutes: pt with posterior lean needing mod faciliation at sacrum to keep weight forward Exercise  General Exercises - Lower Extremity Mini-Sqauts: AAROM;Both;10 reps;Standing (hands supported by RW) End of Session PT - End of Session Equipment Utilized During Treatment: Gait belt Activity Tolerance: Patient tolerated treatment well;Patient limited by fatigue Patient left: in chair;with call bell in reach (RN with pt; sitting on bed pan in recliner) Nurse Communication: Mobility status for transfers;Mobility status for ambulation General Behavior During Session: [facility] for tasks performed Cognition: Impaired  WHITLOW,Kayton Dunaj HELEN 03/25/2011, 4:06 PM

## 2011-03-25 NOTE — Progress Notes (Signed)
UR Completed.  [place] Jane [phone] 03/25/2011  

## 2011-03-26 LAB — GLUCOSE, CAPILLARY
Glucose-Capillary: 97 mg/dL (ref 70–99)
Glucose-Capillary: 88 mg/dL (ref 70–99)
Glucose-Capillary: 116 mg/dL — ABNORMAL HIGH (ref 70–99)

## 2011-03-26 LAB — BASIC METABOLIC PANEL
BUN: 14 mg/dL (ref 6–23)
Chloride: 106 mEq/L (ref 96–112)
GFR calc Af Amer: 81 mL/min — ABNORMAL LOW (ref >90)
Potassium: 4.5 mEq/L (ref 3.5–5.1)
Sodium: 137 mEq/L (ref 135–145)

## 2011-03-26 LAB — CBC
HCT: 32.5 % — ABNORMAL LOW (ref 39.0–52.0)
Hemoglobin: 11 g/dL — ABNORMAL LOW (ref 13.0–17.0)
RDW: 12.8 % (ref 11.5–15.5)
WBC: 10.8 10*3/uL — ABNORMAL HIGH (ref 4.0–10.5)

## 2011-03-26 MED ORDER — ALUM & MAG HYDROXIDE-SIMETH 200-200-20 MG/5ML PO SUSP: 15.0000 mL | ORAL | Status: DC | PRN

## 2011-03-26 MED ORDER — GUAIFENESIN-DM 100-10 MG/5ML PO SYRP: 15.0000 mL | ORAL_SOLUTION | ORAL | Status: DC | PRN

## 2011-03-26 MED ORDER — SODIUM CHLORIDE 0.9 % IV SOLN
12.5000 mg | Freq: Three times a day (TID) | INTRAVENOUS | Status: DC | PRN
  Filled 2011-03-26: qty 0.5

## 2011-03-26 MED ORDER — SODIUM CHLORIDE 0.9 % IJ SOLN
3.0000 mL | Freq: Two times a day (BID) | INTRAMUSCULAR | Status: DC
  Administered 2011-03-26 – 2011-03-31 (×12): 3 mL via INTRAVENOUS

## 2011-03-26 MED ORDER — MOVING RIGHT ALONG BOOK
Freq: Once | Status: AC
  Administered 2011-03-26: 10:00:00
  Filled 2011-03-26: qty 1

## 2011-03-26 MED ORDER — INSULIN ASPART 100 UNIT/ML ~~LOC~~ SOLN
0.0000 [IU] | Freq: Three times a day (TID) | SUBCUTANEOUS | Status: DC
  Administered 2011-03-27: 2 [IU] via SUBCUTANEOUS

## 2011-03-26 MED ORDER — IPRATROPIUM-ALBUTEROL 18-103 MCG/ACT IN AERO
2.0000 | INHALATION_SPRAY | Freq: Four times a day (QID) | RESPIRATORY_TRACT | Status: DC | PRN
  Filled 2011-03-26: qty 14.7

## 2011-03-26 MED ORDER — SODIUM CHLORIDE 0.9 % IJ SOLN: 3.0000 mL | INTRAMUSCULAR | Status: DC | PRN

## 2011-03-26 MED ORDER — SODIUM CHLORIDE 0.9 % IV SOLN: 250.0000 mL | INTRAVENOUS | Status: DC | PRN

## 2011-03-26 MED FILL — Heparin Sodium (Porcine) Inj 1000 Unit/ML: INTRAMUSCULAR | Qty: 10 | Status: AC

## 2011-03-26 MED FILL — Electrolyte-R (PH 7.4) Solution: INTRAVENOUS | Qty: 4000 | Status: AC

## 2011-03-26 MED FILL — Mannitol IV Soln 20%: INTRAVENOUS | Qty: 500 | Status: AC

## 2011-03-26 MED FILL — Heparin Sodium (Porcine) Inj 1000 Unit/ML: INTRAMUSCULAR | Qty: 30 | Status: AC

## 2011-03-26 MED FILL — Sodium Bicarbonate IV Soln 8.4%: INTRAVENOUS | Qty: 50 | Status: AC

## 2011-03-26 MED FILL — Sodium Chloride Irrigation Soln 0.9%: Qty: 3000 | Status: AC

## 2011-03-26 MED FILL — Lidocaine HCl IV Inj 20 MG/ML: INTRAVENOUS | Qty: 5 | Status: AC

## 2011-03-26 MED FILL — Sodium Chloride IV Soln 0.9%: INTRAVENOUS | Qty: 1000 | Status: AC

## 2011-03-26 NOTE — Progress Notes (Signed)
3 Days Post-Op Procedure(s) (LRB): CORONARY ARTERY BYPASS GRAFTING (CABG) (N/A) Subjective: C/o hiccups Only hurts when i cough  Objective: Vital signs in last 24 hours: Temp:  [98.4 F (36.9 C)-99.4 F (37.4 C)] 99.3 F (37.4 C) (03/14 0824) Pulse Rate:  [75-89] 85  (03/14 0800) Cardiac Rhythm:  [-] Normal sinus rhythm (03/14 0800) Resp:  [16-25] 19  (03/14 0800) BP: (96-140)/(53-86) 121/81 mmHg (03/14 0800) SpO2:  [92 %-99 %] 98 % (03/14 0800) Weight:  [208 lb 5.4 oz (94.5 kg)] 208 lb 5.4 oz (94.5 kg) (03/14 0500)  Hemodynamic parameters for last 24 hours:    Intake/Output from previous day: 03/13 0701 - 03/14 0700 In: 1140 [P.O.:820; I.V.:320] Out: 1280 [Urine:1280] Intake/Output this shift: Total I/O In: 30 [P.O.:30] Out: -   Neurologic: intact and some confusion, but oriented now Heart: regular rate and rhythm Lungs: diminished breath sounds bibasilar Abdomen: normal findings: soft, non-tender  Lab Results:  Basename 03/26/11 0350 03/25/11 0513  WBC 10.8* 11.9*  HGB 11.0* 10.9*  HCT 32.5* 32.3*  PLT 130* 125*   BMET:  Basename 03/26/11 0350 03/25/11 0513  NA 137 136  K 4.5 4.2  CL 106 104  CO2 25 24  GLUCOSE 99 104*  BUN 14 14  CREATININE 1.06 1.22  CALCIUM 8.7 8.8    PT/INR:  Basename 03/23/11 2000  LABPROT 18.0*  INR 1.46   ABG    Component Value Date/Time   PHART 7.326* 03/24/2011 0320   HCO3 18.8* 03/24/2011 0320   TCO2 20 03/24/2011 1623   ACIDBASEDEF 6.0* 03/24/2011 0320   O2SAT 98.0 03/24/2011 0320   CBG (last 3)   Basename 03/26/11 0444 03/25/11 2000 03/25/11 1555  GLUCAP 88 141* 114*    Assessment/Plan: S/P Procedure(s) (LRB): CORONARY ARTERY BYPASS GRAFTING (CABG) (N/A) Plan for transfer to step-down: see transfer orders POD #2 CABG CV stable Respiratory- pulmonary toilet, combivent PNR Renal- lytes, creatinine OK, diurese Confusion- responds to ativan Hiccups- will add PRN thorazine Deconditioning- PT/OT    LOS: 8 days     Nixxon Faria C 03/26/2011

## 2011-03-26 NOTE — Progress Notes (Signed)
Physical Therapy Treatment Patient Details Name: [name] MRN: [id number] DOB: 09/25/1940 Today's Date: 03/26/2011  PT Assessment/Plan  PT - Assessment/Plan Comments on Treatment Session: Much improved from yesterday although still demonstrating a very odd stiff gait pattern requiring bilateral facilitation for ricprocal gait pattern. Will continue to progress. Pt understands he is not safe to d/c home until after short term rehab.  PT Plan: Discharge plan remains appropriate;Frequency remains appropriate Follow Up Recommendations: Skilled nursing facility Equipment Recommended: Defer to next venue PT Goals  Acute Rehab PT Goals PT Goal: Rolling Supine to Left Side - Progress: Progressing toward goal PT Goal: Supine/Side to Sit - Progress: Progressing toward goal PT Goal: Sit at Edge Of Bed - Progress: Progressing toward goal PT Goal: Sit to Stand - Progress: Progressing toward goal PT Goal: Stand to Sit - Progress: Progressing toward goal PT Transfer Goal: Bed to Chair/Chair to Bed - Progress: Progressing toward goal PT Goal: Stand - Progress: Progressing toward goal PT Goal: Ambulate - Progress: Progressing toward goal PT Goal: Perform Home Exercise Program - Progress: Progressing toward goal  PT Treatment Precautions/Restrictions  Precautions Precautions: Fall;Sternal Precaution Comments:   Mobility (including Balance) Bed Mobility Rolling Left: 1: +2 Total assist (50%) Rolling Left Details (indicate cue type and reason): sequencing cues especially for knee flexion and initation of trunk rotation Left Sidelying to Sit: 1: +2 Total assist (50%) Left Sidelying to Sit Details (indicate cue type and reason): cues for sternal precautions and faciliation for trunk upright, modA to kick legs off bed Sitting - Scoot to Edge of Bed: 3: Mod assist Sitting - Scoot to Edge of Bed Details (indicate cue type and reason): use of pad for scooting reciprocally Transfers Sit to Stand:  1: +2 Total assist (80%) Sit to Stand Details (indicate cue type and reason): sit<->stand today x3 today; pt needing sequencing cues for anterior translation of trunk over BOS as well as to maintain this in standing as pt tends to fall posteriorly on heels with no righting reaction noted Stand to Sit: 1: +2 Total assist (80%) Stand to Sit Details: better control with sit today; slow but controlled still needing cues for flexion and safe hand placement Ambulation/Gait Ambulation/Gait Assistance Details (indicate cue type and reason): again pt very stiff/rigid in standing; max cueing/facilitation at bilateral hips for weight shift and trunk rotation in assist for LE advancement reciprocally; pt also responding well to max simple v/c's for reciprocal stepping; pt maintains downward gaze throughout gait overusing upper traps needing cues for relaxation; ambulated 20 ft and sat to rest and then another 20 ft after seated rest break Ambulation Distance (Feet): 40 Feet (20 ft x2) Assistive device: Rolling walker  Static Sitting Balance Static Sitting - Balance Support: No upper extremity supported Static Sitting - Level of Assistance: 5: Stand by assistance Static Standing Balance Static Standing - Balance Support: Bilateral upper extremity supported Static Standing - Level of Assistance: 4: Min assist;3: Mod assist Static Standing - Comment/# of Minutes: falling posteriorly; mod-min facilitation at sacrum for hip extension and post-ant weight shift Dynamic Standing Balance Dynamic Standing - Comments: in prep for gait pt cued for lateral weight shift as well as marching supported with bilateral hands on RW; modA Exercise  General Exercises - Lower Extremity Ankle Circles/Pumps: AROM;20 reps;Both;Supine Heel Slides: AROM;10 reps;Both;Supine End of Session PT - End of Session Equipment Utilized During Treatment: Gait belt Activity Tolerance: Patient tolerated treatment well Patient left: in  chair Nurse Communication: Mobility status for transfers;Mobility  status for ambulation General Behavior During Session: [facility] for tasks performed Cognition: Impaired Cognitive Impairment: delayed processing; hallucinating again today; good reasoning today for safety  [facility] HELEN 03/26/2011, 4:05 PM

## 2011-03-26 NOTE — Plan of Care (Signed)
Problem: Phase III Progression Outcomes Goal: Time patient transferred to PCTU/Telemetry POD Outcome: Completed/Met Date Met:  03/26/11 Transferred 1600 from 2300

## 2011-03-27 ENCOUNTER — Inpatient Hospital Stay (HOSPITAL_COMMUNITY): Payer: Medicare PPO

## 2011-03-27 LAB — BASIC METABOLIC PANEL
GFR calc Af Amer: 87 mL/min — ABNORMAL LOW (ref >90)
GFR calc non Af Amer: 75 mL/min — ABNORMAL LOW (ref >90)
Glucose, Bld: 107 mg/dL — ABNORMAL HIGH (ref 70–99)
Potassium: 4.2 mEq/L (ref 3.5–5.1)
Sodium: 137 mEq/L (ref 135–145)

## 2011-03-27 LAB — GLUCOSE, CAPILLARY
Glucose-Capillary: 94 mg/dL (ref 70–99)
Glucose-Capillary: 119 mg/dL — ABNORMAL HIGH (ref 70–99)

## 2011-03-27 LAB — CBC
Hemoglobin: 11 g/dL — ABNORMAL LOW (ref 13.0–17.0)
MCHC: 33.6 g/dL (ref 30.0–36.0)
RDW: 12.9 % (ref 11.5–15.5)

## 2011-03-27 NOTE — Evaluation (Signed)
Occupational Therapy Evaluation **late entry for 03/26/11** Patient Details Name: [name] MRN: [id number] DOB: January 20, 1940 Today's Date: 03/27/2011  Problem List:  Patient Active Problem List  Diagnoses  . PANIC ATTACK  . ERECTILE DYSFUNCTION  . ALCOHOL ABUSE  . TOBACCO ABUSE  . DEPRESSION  . HYPERTENSION  . CAROTID ARTERY DISEASE  . GERD  . BARRETTS ESOPHAGUS  . OSTEOARTHRITIS  . FATIGUE  . GAIT DISTURBANCE  . COLONIC POLYPS, BENIGN, HX OF  . BENIGN PROSTATIC HYPERTROPHY, HX OF  . Hyperlipidemia  . General medical examination  . Chest pain    Past Medical History:  Past Medical History  Diagnosis Date  . Carotid arterial disease     s/p RCEA 11/2008 (Note: hx of negative cardiac nuc 03/2009)  . Hypertension   . Colonic polyp     cscopes 08/18/05 tics,polyps.Marland Kitchenall hyperplastic  . GERD (gastroesophageal reflux disease)     Barrets Esop. (per bx 08/2005) and HH f/u Dr.Schooler  . Anal warts   . Anal fissure   . Hemorrhoids   . 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 Panic attack   . Osteoarthritis   . Depression   . H/O ETOH abuse     Former. Quit ~2012   Past Surgical History:  Past Surgical History  Procedure Date  . Right carotid endartectomy with dacron patch angioplasty 11/2008  . Appendectomy   . Knee surgery   . Foot surgery     fx  . Total hip arthroplasty 2005    R due to OA  . Skin tags excised   . Transurethral resection of prostate   . Cataract extraction     wears contact lens L, good vision  . 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  Will benefit from skilled OT in the acute setting to maximize I with ADL and ADL mobility to Mod I-S level upon d/c OT Recommendation/Assessment: Patient will need skilled OT in the acute care venue OT Problem List: Decreased strength;Decreased activity tolerance;Impaired balance (sitting and/or standing);Decreased knowledge of use of DME or AE;Decreased knowledge of precautions;Cardiopulmonary status limiting activity;Pain;Decreased cognition OT Therapy Diagnosis : Generalized weakness;Acute pain;Altered mental status OT Plan OT Frequency: Min 1X/week OT Treatment/Interventions: Self-care/ADL training;Therapeutic exercise;DME and/or AE instruction;Energy conservation;Therapeutic activities;Patient/family education;Balance training OT Recommendation Follow Up Recommendations: Skilled nursing facility Equipment Recommended: Defer to next venue Individuals Consulted Consulted and Agree with Results and Recommendations: Patient OT Goals Acute Rehab OT Goals OT Goal Formulation: With patient Time For Goal Achievement: 2 weeks ADL Goals Pt Will Perform Grooming: with modified independence;Standing at sink ADL Goal: Grooming - Progress: Goal set today Pt Will Perform Lower Body Bathing: with set-up;with supervision;Sit to stand from chair;Sit to stand from bed ADL Goal: Lower Body Bathing - Progress: Goal set today Pt Will Perform Upper Body Dressing: with set-up;with modified independence;Sitting, chair;Sitting, bed ADL Goal: Upper Body Dressing - Progress: Goal set today Pt Will Perform Lower Body Dressing: with supervision;with set-up;with adaptive equipment;Sit to stand from chair;Sit to stand from bed ADL Goal: Lower Body Dressing - Progress: Goal set today Pt Will Transfer to Toilet: with  modified independence;Ambulation;with DME;3-in-1 ADL Goal: Toilet Transfer - Progress: Goal set today Pt Will Perform Toileting - Clothing Manipulation: with supervision;Standing ADL Goal: Toileting -  Clothing Manipulation - Progress: Goal set today Pt Will Perform Tub/Shower Transfer: Tub transfer;with supervision;with DME;Ambulation ADL Goal: Tub/Shower Transfer - Progress: Goal set today  OT Evaluation Precautions/Restrictions  Precautions Precautions: Fall;Sternal Precaution Comments: 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 precautions Prior Functioning Home Living Lives With: Family Receives Help From: Friend(s) Type of Home: House Home Layout: Two level;Bed/bath upstairs Alternate Level Stairs-Number of Steps: full flight but patient reports chair lift for the stairs Home Access: Stairs to enter Entrance Stairs-Rails: Right Entrance Stairs-Number of Steps: 7-8 steps Bathroom Shower/Tub: Engineer, manufacturing systems: Standard Home Adaptive Equipment: Walker - rolling;Bedside commode/3-in-1 Prior Function Level of Independence: Independent with basic ADLs;Requires assistive device for independence Driving: No Comments: Cares for his 50 y/o mother at home; she is currently being taken care of by live in nursing staff? ADL ADL Eating/Feeding: Not assessed Grooming: Performed;Wash/dry face Where Assessed - Grooming: Sitting, bed Upper Body Bathing: Simulated;Moderate assistance Upper Body Bathing Details (indicate cue type and reason): pt with tendency to lean to the right Where Assessed - Upper Body Bathing: Sitting, bed Lower Body Bathing: Simulated;+1 Total assistance Lower Body Bathing Details (indicate cue type and reason): unable to reach to feet Where Assessed - Lower Body Bathing: Sit to stand from bed Upper Body Dressing: Simulated;Moderate assistance Where Assessed - Upper Body Dressing: Sitting, bed Lower Body Dressing: Simulated;+1 Total assistance Lower Body Dressing Details (indicate cue type and reason): unable to reach feet Where Assessed - Lower Body Dressing: Sit to stand from bed Toilet Transfer: Simulated;+2  Total assistance (pt=75%) Toilet Transfer Details (indicate cue type and reason): simulated EOB to chair (walking with RW~40Ft). see ambulation for details. Toilet Transfer Method: Ambulating Toileting - Clothing Manipulation: Not assessed Toileting - Hygiene: Not assessed Tub/Shower Transfer: Not assessed Equipment Used: Rolling walker Ambulation Related to ADLs: Pt with rigid gait and with little/no knee or hip flexion and very little advancement with stepping. Unable to determine if this is baseline. See PT note for more details. Vision/Perception  Vision - History Patient Visual Report: No change from baseline Cognition Cognition Arousal/Alertness: Awake/alert Overall Cognitive Status: Impaired Attention: Impaired Current Attention Level: Selective Following Commands: Follows one step commands consistently;Follows multi-step commands inconsistently Safety/Judgement: Decreased awareness of safety precautions Decreased Safety/Judgement: Decreased awareness of need for assistance;Impulsive Safety/Judgement - Other Comments: pt attempting to get out of the recliner chair despite having just transferred to chair needing +2total assistance for safety  Awareness of Deficits: Decreased awareness of deficits Problem Solving: Requires assistance for problem solving Cognition - Other Comments: Pt hallucinating multiple times during session swearing that he sees ants moving on the floor despite reassurance that they were not there; assist to problem solve ambulation (weight shift and advancing limbs reciprocally) Sensation/Coordination Sensation Light Touch: Appears Intact Extremity Assessment RUE Assessment RUE Assessment: Within Functional Limits LUE Assessment LUE Assessment: Within Functional Limits Mobility  Bed Mobility Rolling Left: 1: +2 Total assist (50%) Rolling Left Details (indicate cue type and reason): sequencing cues especially for knee flexion and initation of trunk  rotation Left Sidelying to Sit: 1: +2 Total assist (50%) Left Sidelying to Sit Details (indicate cue type and reason): cues for sternal precautions and faciliation for trunk upright, modA to kick legs off bed Sitting - Scoot to Edge of Bed: 3: Mod assist Sitting -  Scoot to [place] of Bed Details (indicate cue type and reason): use of pad for scooting reciprocally Transfers Sit to Stand: 1: +2 Total assist (80%) Sit to Stand Details (indicate cue type and reason): sit<->stand today x3 today; pt needing sequencing cues for anterior translation of trunk over BOS as well as to maintain this in standing as pt tends to fall posteriorly on heels with no righting reaction noted Stand to Sit: 1: +2 Total assist (80%) Stand to Sit Details: better control with sit today; slow but controlled still needing cues for flexion and safe hand placement End of Session OT - End of Session Equipment Utilized During Treatment: Gait belt Activity Tolerance: Patient tolerated treatment well Patient left: in chair;with call bell in reach Nurse Communication: Mobility status for transfers;Mobility status for ambulation General Behavior During Session: [facility] for tasks performed   Aragon Scarantino 03/27/2011, 7:47 AM

## 2011-03-27 NOTE — Progress Notes (Signed)
Patient up to chair x1 max 2 person assist to stand and pivot to chair, patient needing cues to move legs and feet to get to bed/chair.  willmonitor patient. Mounir Skipper, Randall An RN

## 2011-03-27 NOTE — Progress Notes (Addendum)
[address]            [place] [address]          [phone]     4 Days Post-Op  Procedure(s) (LRB): CORONARY ARTERY BYPASS GRAFTING (CABG) (N/A) Subjective: C/O Sternal pain  Objective  Telemetry sinus rhythm  Temp:  [98.3 F (36.8 C)-99.3 F (37.4 C)] 98.8 F (37.1 C) (03/15 0548) Pulse Rate:  [70-96] 85  (03/15 0548) Resp:  [18-24] 20  (03/15 0548) BP: (108-148)/(57-87) 122/87 mmHg (03/15 0548) SpO2:  [90 %-100 %] 96 % (03/15 0548) FiO2 (%):  [0 %] 0 % (03/14 0947) Weight:  [203 lb 14.4 oz (92.488 kg)] 203 lb 14.4 oz (92.488 kg) (03/15 0548)   Intake/Output Summary (Last 24 hours) at 03/27/11 0814 Last data filed at 03/27/11 0555  Gross per 24 hour  Intake    700 ml  Output   1725 ml  Net  -1025 ml   Physical Examination: General appearance - alert, NAD Chest - + upper airway wheeze, diminished in bases Heart - S1 and S2 normal Abdomen - + BS, soft, nontender, mild distension Extremities - no edema Skin - incisions healing well, small amount of old blood from cephalad portion of sternal incision   Lab Results:  Basename 03/27/11 0500 03/26/11 0350 03/24/11 1623  NA 137 137 --  K 4.2 4.5 --  CL 104 106 --  CO2 26 25 --  GLUCOSE 107* 99 --  BUN 12 14 --  CREATININE 1.00 1.06 --  CALCIUM 8.6 8.7 --  MG -- -- 2.3  PHOS -- -- --   No results found for this basename: AST:2,ALT:2,ALKPHOS:2,BILITOT:2,PROT:2,ALBUMIN:2 in the last 72 hours No results found for this basename: LIPASE:2,AMYLASE:2 in the last 72 hours  Basename 03/27/11 0500 03/26/11 0350  WBC 8.1 10.8*  NEUTROABS -- --  HGB 11.0* 11.0*  HCT 32.7* 32.5*  MCV 97.0 97.3  PLT 165 130*   No results found for this basename: CKTOTAL:4,CKMB:4,TROPONINI:4 in the last 72 hours No components found with this basename: POCBNP:3 No results found for this basename: DDIMER in the last 72 hours No results found for this basename: HGBA1C in the last 72 hours No results  found for this basename: CHOL,HDL,LDLCALC,TRIG,CHOLHDL in the last 72 hours No results found for this basename: TSH,T4TOTAL,FREET3,T3FREE,THYROIDAB in the last 72 hours No results found for this basename: VITAMINB12,FOLATE,FERRITIN,TIBC,IRON,RETICCTPCT in the last 72 hours  Medications: Scheduled    . acetaminophen  1,000 mg Oral Q6H   Or  . acetaminophen (TYLENOL) oral liquid 160 mg/5 mL  975 mg Per Tube Q6H  . aspirin EC  325 mg Oral Daily   Or  . aspirin  324 mg Per Tube Daily  . bisacodyl  10 mg Oral Daily   Or  . bisacodyl  10 mg Rectal Daily  . docusate sodium  200 mg Oral Daily  . enoxaparin (LOVENOX) injection  40 mg Subcutaneous Daily  . furosemide  40 mg Oral Daily  . insulin aspart  0-24 Units Subcutaneous TID AC & HS  . metoprolol tartrate  12.5 mg Oral BID   Or  . metoprolol tartrate  12.5 mg Per Tube BID  . moving right along book   Does not apply Once  . pantoprazole  40 mg Oral Q1200  . potassium chloride  20 mEq Oral BID  . simvastatin  20 mg  Oral q1800  . sodium chloride  3 mL Intravenous Q12H  . DISCONTD: insulin aspart  0-24 Units Subcutaneous Q4H  . DISCONTD: insulin glargine  25 Units Subcutaneous Daily  . DISCONTD: polyethylene glycol  17 g Oral Daily  . DISCONTD: sodium chloride  3 mL Intravenous Q12H     Radiology/Studies:  No results found.  INR: Will add last result for INR, ABG once components are confirmed Will add last 4 CBG results once components are confirmed  Assessment/Plan: S/P Procedure(s) (LRB): CORONARY ARTERY BYPASS GRAFTING (CABG) (N/A)  1. Overall, making good progress 2. Needs to be pushed some with rehab as able 3. Confusion improving, but "see spiders"-hallucination at times 4. Labs -stable 5. Cont inhalers, pulm toilet 6. Gentle diuresis 7. Will need ST rehab/SNF per PT for safety 7. cbg well controlled  LOS: 9 days    [name],[name] 3/15/20138:14 AM   patient seen and examined. Agree with above

## 2011-03-28 MED ORDER — LACTULOSE 10 GM/15ML PO SOLN
20.0000 g | Freq: Once | ORAL | Status: AC
  Administered 2011-03-28: 20 g via ORAL
  Filled 2011-03-28: qty 30

## 2011-03-28 MED ORDER — ENOXAPARIN SODIUM 40 MG/0.4ML ~~LOC~~ SOLN
40.0000 mg | Freq: Every day | SUBCUTANEOUS | Status: DC
  Administered 2011-03-29 – 2011-04-01 (×4): 40 mg via SUBCUTANEOUS
  Filled 2011-03-28 (×4): qty 0.4

## 2011-03-28 MED ORDER — FLEET ENEMA 7-19 GM/118ML RE ENEM
1.0000 | ENEMA | Freq: Once | RECTAL | Status: DC
  Filled 2011-03-28: qty 1

## 2011-03-28 NOTE — Progress Notes (Addendum)
5 Days Post-Op Procedure(s) (LRB): CORONARY ARTERY BYPASS GRAFTING (CABG) (N/A)  Subjective: Patient has had intermittent confusion and hallucinations.He is alert and oriented this am. He does have complaints of constipation and a productive cough (clear sputum).  Objective: Vital signs in last 24 hours: Patient Vitals for the past 24 hrs:  BP Temp Temp src Pulse Resp SpO2 Weight  03/28/11 0427 136/79 mmHg 98.7 F (37.1 C) Oral 81  18  96 % 206 lb 1.6 oz (93.486 kg)  03/27/11 1957 147/91 mmHg 98.7 F (37.1 C) Oral 88  20  96 % -  03/27/11 1425 125/72 mmHg 98.6 F (37 C) Oral 77  18  99 % -  03/27/11 1041 142/75 mmHg - - 84  - - -   Pre op weight  90.3 kg Current Weight  03/28/11 206 lb 1.6 oz (93.486 kg)      Intake/Output from previous day: 03/15 0701 - 03/16 0700 In: 1083 [P.O.:1080; I.V.:3] Out: 1050 [Urine:1050]   Physical Exam:  Cardiovascular: RRR, no murmurs, gallops, or rubs. Pulmonary: Clear to auscultation bilaterally; no rales, wheezes, or rhonchi. Abdomen: Soft, non tender, bowel sounds present. Extremities: Mild bilateral lower extremity edema. Wounds: Serosanguinous drainage from proximal portion of sternal wound.No erythema.  Lab Results: CBC: Basename 03/27/11 0500 03/26/11 0350  WBC 8.1 10.8*  HGB 11.0* 11.0*  HCT 32.7* 32.5*  PLT 165 130*   BMET:  Basename 03/27/11 0500 03/26/11 0350  NA 137 137  K 4.2 4.5  CL 104 106  CO2 26 25  GLUCOSE 107* 99  BUN 12 14  CREATININE 1.00 1.06  CALCIUM 8.6 8.7    PT/INR: No results found for this basename: LABPROT,INR in the last 72 hours ABG:  INR: Will add last result for INR, ABG once components are confirmed Will add last 4 CBG results once components are confirmed  Assessment/Plan:  1. CV - SR.Continue Lopressor 12.5 bid. 2.  Pulmonary - Encourage incentive spirometer. 3. Volume Overload - Continue with diuresis. 4. Mild Acute blood loss anemia - Last H/H 11/32.7. 5.Pre op HGA1C 5.5.CBGs  have remained 140 or less. Will stop glucose checks and SS. 6.LOC constipation. 7.Remove EPW.   ZIMMERMAN,DONIELLE MPA-C 03/28/2011  Patient seen and examined. Agree with above. He's still constipated despite our best efforts- will try a Fleet's enema.

## 2011-03-28 NOTE — Progress Notes (Signed)
03/28/2011 1:03 PM Nursing Note Resistance met while attempting to discontinue EPW. Doree Fudge [facility] made aware. Orders received to leave EPW in place in current condition until re-evaluated by PA in am. Patient updated on plan of care.  Frequent vital signs obtained x 1 hr post attempted removal.  Will continue to monitor.  Skylene Deremer, Blanchard Kelch

## 2011-03-28 NOTE — Progress Notes (Signed)
Changed incision dressing at top porion of sternum. Saturated with serosanguineous drainage. New dressing applied. Will continue to monitor.

## 2011-03-28 NOTE — Progress Notes (Signed)
CARDIAC REHAB PHASE I   PRE:  Rate/Rhythm: 82 sinus  BP:  Supine: 140/84  Sitting: 140/80  Standing:    SaO2: 97 RA  MODE:  Ambulation: 48 ft   POST:  Rate/Rhythm: 116  BP:  Supine:   Sitting: 130/80  Standing:    SaO2: 99 RA  Order received and appreciated.  Chart reviewed.  Pt ambulated 48 ft with assist x2 using rolling walker.  Pt tolerated walk fairly well, but tired quickly.   Pt returned to bed after walk with call bell in reach.  Pt will need reinforcement on not using his arms to get up and down and reminders how to do that.  We will f/u on Monday. Fabio Pierce, MA, ACSM RCEP [phone]  Hazle Nordmann

## 2011-03-29 MED ORDER — METOPROLOL TARTRATE 25 MG PO TABS
25.0000 mg | ORAL_TABLET | Freq: Two times a day (BID) | ORAL | Status: DC
  Administered 2011-03-29 – 2011-04-01 (×6): 25 mg via ORAL
  Filled 2011-03-29 (×7): qty 1

## 2011-03-29 MED ORDER — METOPROLOL TARTRATE 25 MG PO TABS: 12.5000 mg | ORAL_TABLET | Freq: Two times a day (BID) | ORAL | Status: DC

## 2011-03-29 MED ORDER — METOPROLOL TARTRATE 25 MG PO TABS: 25.0000 mg | ORAL_TABLET | Freq: Two times a day (BID) | ORAL | Status: DC

## 2011-03-29 MED ORDER — TRAMADOL HCL 50 MG PO TABS: 50.0000 mg | ORAL_TABLET | Freq: Four times a day (QID) | ORAL | Status: AC | PRN

## 2011-03-29 NOTE — Discharge Instructions (Signed)
Activity: 1.May walk up steps                2.No lifting more than ten pounds for four weeks.                 3.No driving for four weeks.                4.Stop any activity that causes chest pain, shortness of breath, dizziness, sweating or excessive weakness.                5.Avoid straining.                6.Continue with your breathing exercises daily.  Diet: Diabetic diet and Low fat, Low salt diet  Wound Care: May shower.  Clean wounds with mild soap and water daily. Contact the office at [phone] if any problems arise.  Coronary Artery Bypass Grafting Care After Refer to this sheet in the next few weeks. These instructions provide you with information on caring for yourself after your procedure. Your caregiver may also give you more specific instructions. Your treatment has been planned according to current medical practices, but problems sometimes occur. Call your caregiver if you have any problems or questions after your procedure.  Recovery from open heart surgery will be different for everyone. Some people feel well after 3 or 4 weeks, while for others it takes longer. After heart surgery, it may be normal to:  Not have an appetite, feel nauseated by the smell of food, or only want to eat a small amount.   Be constipated because of changes in your diet, activity, and medicines. Eat foods high in fiber. Add fresh fruits and vegetables to your diet. Stool softeners may be helpful.   Feel sad or unhappy. You may be frustrated or cranky. You may have good days and bad days. Do not give up. Talk to your caregiver if you do not feel better.   Feel weakness and fatigue. You many need physical therapy or cardiac rehabilitation to get your strength back.   Develop an irregular heartbeat called atrial fibrillation. Symptoms of atrial fibrillation are a fast, irregular heartbeat or feelings of fluttery heartbeats, shortness of breath, low blood pressure, and dizziness. If these symptoms  develop, see your caregiver right away.  MEDICATION  Have a list of all the medicines you will be taking when you leave the hospital. For every medicine, know the following:   Name.   Exact dose.   Time of day to be taken.   How often it should be taken.   Why you are taking it.   Ask which medicines should or should not be taken together. If you take more than one heart medicine, ask if it is okay to take them together. Some heart medicines should not be taken at the same time because they may lower your blood pressure too much.   Narcotic pain medicine can cause constipation. Eat fresh fruits and vegetables. Add fiber to your diet. Stool softener medicine may help relieve constipation.   Keep a copy of your medicines with you at all times.   Do not add or stop taking any medicine until you check with your caregiver.   Medicines can have side effects. Call your caregiver who prescribed the medicine if you:   Start throwing up, have diarrhea, or have stomach pain.   Feel dizzy or lightheaded when you stand up.   Feel your heart is skipping beats or is   beating too fast or too slow.   Develop a rash.   Notice unusual bruising or bleeding.  HOME CARE INSTRUCTIONS  After heart surgery, it is important to learn how to take your pulse. Have your caregiver show you how to take your pulse.   Use your incentive spirometer. Ask your caregiver how long after surgery you need to use it.  Care of your chest incision  Tell your caregiver right away if you notice clicking in your chest (sternum).   Support your chest with a pillow or your arms when you take deep breaths and cough.   Follow your caregiver's instructions about when you can bathe or swim.   Protect your incision from sunlight during the first year to keep the scar from getting dark.   Tell your caregiver if you notice:   Increased tenderness of your incision.   Increased redness or swelling around your incision.     Drainage or pus from your incision.  Care of your leg incision(s)  Avoid crossing your legs.   Avoid sitting for long periods of time. Change positions every half hour.   Elevate your leg(s) when you are sitting.   Check your leg(s) daily for swelling. Check the incisions for redness or drainage.   Wear your elastic stockings as told by your caregiver. Take them off at bedtime.  Diet  Diet is very important to heart health.   Eat plenty of fresh fruits and vegetables. Meats should be lean cut. Avoid canned, processed, and fried foods.   Talk to a dietician. They can teach you how to make healthy food and drink choices.  Weight  Weigh yourself every day. This is important because it helps to know if you are retaining fluid that may make your heart and lungs work harder.   Use the same scale each time.   Weigh yourself every morning at the same time. You should do this after you go to the bathroom, but before you eat breakfast.   Your weight will be more accurate if you do not wear any clothes.   Record your weight.   Tell your caregiver if you have gained 2 pounds or more overnight.  Activity Stop any activity at once if you have chest pain, shortness of breath, irregular heartbeats, or dizziness. Get help right away if you have any of these symptoms.  Bathing.  Avoid soaking in a bath or hot tub until your incisions are healed.   Rest. You need a balance of rest and activity.   Exercise. Exercise per your caregiver's advice. You may need physical therapy or cardiac rehabilitation to help strengthen your muscles and build your endurance.   Climbing stairs. Unless your caregiver tells you not to climb stairs, go up stairs slowly and rest if you tire. Do not pull yourself up by the handrail.   Driving a car. Follow your caregiver's advice on when you may drive. You may ride as a passenger at any time. When traveling for long periods of time in a car, get out of the car and  walk around for a few minutes every 2 hours.   Lifting. Avoid lifting, pushing, or pulling anything heavier than 10 pounds for 6 weeks after surgery or as told by your caregiver.   Returning to work. Check with your caregiver. People heal at different rates. Most people will be able to go back to work 6 to 12 weeks after surgery.   Sexual activity. You may resume sexual relations   as told by your caregiver.  SEEK MEDICAL CARE IF:  Any of your incisions are red, painful, or have any type of drainage coming from them.   You have an oral temperature above 102 F (38.9 C).   You have ankle or leg swelling.   You have pain in your legs.   You have weight gain of 2 or more pounds a day.   You feel dizzy or lightheaded when you stand up.  SEEK IMMEDIATE MEDICAL CARE IF:  You have angina or chest pain that goes to your jaw or arms. Call your local emergency services right away.   You have shortness of breath at rest or with activity.   You have a fast or irregular heartbeat (arrhythmia).   There is a "clicking" in your sternum when you move.   You have numbness or weakness in your arms or legs.  MAKE SURE YOU:  Understand these instructions.   Will watch your condition.   Will get help right away if you are not doing well or get worse.  Document Released: 07/18/2004 Document Revised: 12/18/2010 Document Reviewed: 03/05/2010 [employer] Patient Information 2012 [employer].    

## 2011-03-29 NOTE — Discharge Summary (Addendum)
Physician Discharge Summary  Patient ID: [name] MRN: [id number] DOB/AGE: November 24, 1940 71 y.o.  Admit date: 03/18/2011 Discharge date: 03/29/2011  Admission Diagnoses: 1.CAD 2.History of hypertension 3.History of carotid artery disease (s/p RCEA 10') 4.History of ETOH abuse (quit 2012) 5.History of tobacco abuse 6.History of GERD 7.History of depression and panic attacks  Discharge Diagnoses:  1.CAD 2.History of hypertension 3.History of carotid artery disease (s/p RCEA 10') 4.History of ETOH abuse (quit 2012) 5.History of tobacco abuse 6.History of GERD 7.History of depression and panic attacks 8.Mild ABL anemia  Procedure (s):  1.Cardiac catheterization done by Dr. Elease Hashimoto on 03/19/2011 Left Main: The left main is calcified. No discrete stenoses.  Left anterior Descending: The left anterior descending artery is a fairly large vessel. It was difficult to lay the LAD out so that we could see it nicely. The proximal LAD has a irregular, acentric 80-85% stenosis. This is followed by a small aneurysmal area. Remaining LAD has minor little regular shoes. The first diagonal artery has sequential moderate to severe stenosis between 60 and 70%. The LAD and diagonal system are mildly to moderately calcified.  Left Circumflex: The left circumflex is a very large system and is dominant. It is fairly normal in the proximal segment. Then divides into a large marginal branch. The inferior most branch from this first obtuse marginal has a tight subtotal stenosis and fills with TIMI grade 2 flow.  The continuation circumflex branch is fairly normal and supplies a moderate-sized posterior descending artery.  Right Coronary Artery: Right coronary artery is small to moderate size. It is occluded proximally and has formed a very large collateral branch. The distal right coronary artery fills via this right right lateral branch. It does not supply any significant amount of the left ventricle.  LV  Gram: The left ventriculogram was performed in the 30 RAO position. It reveals normal left ventricle systolic function. Ejection fraction is a proximal and a 60-65%.   2.Median sternotomy, extracorporeal circulation, coronary  artery bypass grafting x4 (left internal mammary artery to left anterior  descending artery, saphenous vein graft to first diagonal, saphenous  vein graft to acute marginal, saphenous vein graft to obtuse marginal  3), endoscopic vein harvest, right leg and left thigh by Dr. Dorris Fetch on 03/24/2011.  History of Presenting Illness: This is 71 yo Caucasian male who presented to [facility] emergency room with complaints of chest pain. He has no prior cardiac history. According to the patient, he has had a 4 week history of chest pain. He describes this as a sharp, burning pain sensation running horizontally across his chest, just below the nipple line. He denied any radiation to arms, back, neck or jaw. He began having these episodes every few days initially;they then progressed to once a day, then several episodes a day. He says they now are becoming longer and more severe, which led to hospitalization.These episodes occur after walking, eating, and even at rest. He does not particularly exert himself so unsure if it's worse with exertion.The night prior to admission, the CP was associated with SOB so he came to the ER. His Troponins are negative x 2. CXR showed no acute disease, normal heart size. EKG is nonacute. He received aspirin, NTG patch with last pain episode while down in the ER. He is not tachycardic or tachypnic and denies any LEE or orthopnea. He underwent cardiac catheterization by Dr. Lourena Simmonds on 03/19/2011. He was found have multivessel coronary artery disease with an EF proximal and 60-65%.  A cardiothoracic consultation patient was obtained with Dr. Dorris Fetch for the consideration of coronary artery bypass grafting surgery. Potential risks, benefits, and complications  of surgery discussed with the patient and he agreed to proceed. Preoperative carotid duplex carotid ultrasound showed patent right carotid endarterectomy and no significant left internal carotid artery stenosis. CABG x4  on 03/24/2011.   Brief Hospital Course:  He was extubated without difficulty early the morning of postoperative day #1. He remained afebrile and hemodynamically stable. His Swan-Ganz, A-line, Foley, and chest tubes were all removed early in his postoperative course. She was found to have moderate she blood loss anemia. Her H&H was low as 10.2 and 30. She did not require postoperative transfusion. Her last H&H was up to 11 and 32.7. She also initially have mild thrombocytopenia. Platelet count went as low as 110,000 however her last platelet count 165,000. Patient did have occasional confusion and hallucinations.Narcotics were stopped and he was given Ativan with improvement in his mental status. He was started on a low-dose beta blocker. He was felt surgically stable for transfer from the intensive care to [facility] for further convalescence on 03/26/2011. He continue to progress with PT and OT. His are then tolerating a diet has had a bowel movement. I removed his epicardial pacing wires this morning. His chest tube sutures will be removed on 03/30/2011. Provided he remains afebrile, hemodynamically stable, and pending morning round evaluation, who be surgically stable for discharge to an SNF on 03/30/2011.  Filed Vitals:   03/29/11 1002  BP: 139/78  Pulse: 85  Temp:   Resp:      Latest Vital Signs: Blood pressure 139/78, pulse 85, temperature 97 F (36.1 C), temperature source Oral, resp. rate 20, height 6' (1.829 m), weight 203 lb 14.8 oz (92.5 kg), SpO2 93.00%.  Physical Exam: Cardiovascular: RRR, no murmurs, gallops, or rubs.  Pulmonary: Clear to auscultation bilaterally; no rales, wheezes, or rhonchi.  Abdomen: Soft, non tender, bowel sounds present.  Extremities: Mild  bilateral lower extremity edema.  Wounds: Serosanguinous drainage from proximal portion of sternal wound.No erythema.   Discharge Condition:Stable  Recent laboratory studies:  Lab Results  Component Value Date   WBC 8.1 03/27/2011   HGB 11.0* 03/27/2011   HCT 32.7* 03/27/2011   MCV 97.0 03/27/2011   PLT 165 03/27/2011   Lab Results  Component Value Date   NA 137 03/27/2011   K 4.2 03/27/2011   CL 104 03/27/2011   CO2 26 03/27/2011   CREATININE 1.00 03/27/2011   GLUCOSE 107* 03/27/2011     Diagnostic Studies: Dg Chest 2 View  03/27/2011  *RADIOLOGY REPORT*  Clinical Data: Post CABG  CHEST - 2 VIEW  Comparison: 03/25/2011  Findings: Lungs are essentially clear. No pleural effusion or pneumothorax.  Cardiomediastinal silhouette is within normal limits. Postsurgical changes related to prior CABG.  Mild degenerative changes of the visualized thoracolumbar spine.  IMPRESSION: No evidence of acute cardiopulmonary disease.  Original Report Authenticated By: Charline Bills, M.D.    Discharge Orders    Future Appointments: Provider: Department: Dept Phone: Center:   06/01/2011 1:30 PM Wanda Plump, MD [facility] [phone] [facility]      Discharge Medications: Medication List  As of 03/29/2011 10:53 AM   STOP taking these medications         amLODipine 10 MG tablet         TAKE these medications         aspirin 325 MG tablet   Take 325 mg by  mouth daily.      docusate sodium 100 MG capsule   Commonly known as: COLACE   Take 100 mg by mouth 2 (two) times daily.      LORazepam 0.5 MG tablet   Commonly known as: ATIVAN   Take 0.5 mg by mouth 2 (two) times daily.      metoprolol tartrate 25 MG tablet   Commonly known as: LOPRESSOR   Take 1 tablet (25 mg total) by mouth 2 (two) times daily.      omeprazole 40 MG capsule   Commonly known as: PRILOSEC   Take 40 mg by mouth daily.      pravastatin 40 MG tablet   Commonly known as: PRAVACHOL   Take 40 mg by mouth daily.       traMADol 50 MG tablet   Commonly known as: ULTRAM   Take 1 tablet (50 mg total) by mouth every 6 (six) hours as needed.            Follow Up Appointments: Follow-up Information    Follow up with Willow Ora, MD. (Call for a follow up appointment)       Follow up with Rollene Rotunda., MD. (Call for a follow up appointment for 2 weeks)    Contact information:   [address] [place] [address] [phone]       Follow up with Loreli Slot, MD. (PA/LAT CXR to be taken 45 minutes prior to office appointment with Dr. Earmon Phoenix will call with appointment date and time)    Contact information:   [address] [place] [address] [phone]          Signed: Doree Fudge MPA-C 03/29/2011, 10:53 AM     ADDENDUM:  The patient was scheduled for discharge to skilled nursing facility on 03/30/2011. He was seen on morning round evaluation and was noted to have a small amount of drainage from the sternal incision which was serosanguineous in nature.   This has been observed closely and at the time of this dictation has resolved. There is no sign of infection . He has otherwise remained stable and there have been no other changes since the previously dictated discharge summary.  At this point he is medically stable and ready for transfer to SNF.  His followup appointments, instructions, and medications are unchanged from the previous discharge summary.  Kiyani Jernigan PA-C 03/31/2011 9:34 AM

## 2011-03-29 NOTE — Progress Notes (Addendum)
6 Days Post-Op Procedure(s) (LRB): CORONARY ARTERY BYPASS GRAFTING (CABG) (N/A)  Subjective: Patient has had intermittent confusion and hallucinations.He is alert and oriented again this am. Had a large bowel movement yesterday.  Objective: Vital signs in last 24 hours: Patient Vitals for the past 24 hrs:  BP Temp Temp src Pulse Resp SpO2 Weight  03/29/11 0538 146/90 mmHg 97 F (36.1 C) Oral 83  18  93 % 203 lb 14.8 oz (92.5 kg)  03/28/11 2003 148/79 mmHg 98.7 F (37.1 C) Oral 84  18  96 % -  03/28/11 1400 128/69 mmHg 98.6 F (37 C) Oral 84  16  94 % -  03/28/11 0947 135/66 mmHg - - 81  - - -   Pre op weight  90.3 kg Current Weight  03/29/11 203 lb 14.8 oz (92.5 kg)      Intake/Output from previous day: 03/16 0701 - 03/17 0700 In: 600 [P.O.:600] Out: 900 [Urine:900]   Physical Exam:  Cardiovascular: RRR, no murmurs, gallops, or rubs. Pulmonary: Clear to auscultation bilaterally; no rales, wheezes, or rhonchi. Abdomen: Soft, non tender, bowel sounds present. Extremities: Mild bilateral lower extremity edema. Wounds: Serosanguinous drainage from proximal portion of sternal wound.No erythema.  Lab Results: CBC:  Basename 03/27/11 0500  WBC 8.1  HGB 11.0*  HCT 32.7*  PLT 165   BMET:   Basename 03/27/11 0500  NA 137  K 4.2  CL 104  CO2 26  GLUCOSE 107*  BUN 12  CREATININE 1.00  CALCIUM 8.6    PT/INR: No results found for this basename: LABPROT,INR in the last 72 hours ABG:  INR: Will add last result for INR, ABG once components are confirmed Will add last 4 CBG results once components are confirmed  Assessment/Plan:  1. CV - SR.Increase Lopressor to 25 bid. 2.  Pulmonary - Encourage incentive spirometer. 3. Volume Overload - Continue with diuresis. 4. Mild Acute blood loss anemia - Last H/H 11/32.7. 5.I will remove EPW this am. 6.Remove chest tube sutures in am. 7.To SNF when ready for discharge.   ZIMMERMAN,DONIELLE MPA-C 03/29/2011  Seen and  examined. Feels better after BM Still needs to increase activities/mbulation. Ready for SNF soon

## 2011-03-30 LAB — CBC
MCV: 97.5 fL (ref 78.0–100.0)
Platelets: 316 10*3/uL (ref 150–400)
RBC: 3.62 MIL/uL — ABNORMAL LOW (ref 4.22–5.81)
WBC: 10.3 10*3/uL (ref 4.0–10.5)

## 2011-03-30 MED ORDER — LEVALBUTEROL HCL 0.63 MG/3ML IN NEBU
0.6300 mg | INHALATION_SOLUTION | Freq: Three times a day (TID) | RESPIRATORY_TRACT | Status: DC
  Administered 2011-03-30 – 2011-04-01 (×6): 0.63 mg via RESPIRATORY_TRACT
  Filled 2011-03-30 (×10): qty 3

## 2011-03-30 NOTE — Progress Notes (Signed)
Clinical Social Work Department BRIEF PSYCHOSOCIAL ASSESSMENT 03/30/2011  Patient:  [name]     Account Number:  [id number]     Admit date:  03/26/2011  Clinical Social Worker:  Mykeria Garman, LCSWA  Date/Time:  03/27/2011 04:00 PM  Referred by:  Care Management  Date Referred:  03/27/2011 Referred for  SNF Placement   Other Referral:   Interview type:  Patient Other interview type:    PSYCHOSOCIAL DATA Living Status:  ALONE Admitted from facility:   Level of care:   Primary support name:   Primary support relationship to patient:   Degree of support available:   minimal, pt has no children, and is divorced. States he has "friends" in the area but no one to provide care.    CURRENT CONCERNS Current Concerns  Post-Acute Placement   Other Concerns:    SOCIAL WORK ASSESSMENT / PLAN Pt states he made arrangements for short term rehab at [facility] as he has no one to provide care at time of d/c. Pt is  s/p CABG and so will need care and supervision for 10 days after d/c, per MD. CSW spoke with Sharon at [facility], who confirms she will have private room available for pt at d/c. CSW sent FL2 and clinicals to facility and will submit appropriate documentation to [employer] for authorization. CSW will follow.   Assessment/plan status:  Other - See comment Other assessment/ plan:   SNF placement at d/c when medically ready.   Information/referral to community resources:    PATIENT'S/FAMILY'S RESPONSE TO PLAN OF CARE: Pt was alert and oriented, in good spirits at POD1. Pt very appreciative of CSW assistance.         

## 2011-03-30 NOTE — Progress Notes (Addendum)
                   [name]            [place] [address]          [phone]     7 Days Post-Op  Procedure(s) (LRB): CORONARY ARTERY BYPASS GRAFTING (CABG) (N/A) Subjective: Feels well  Objective  Telemetry sinus rhythm  Temp:  [98 F (36.7 C)-100 F (37.8 C)] 98 F (36.7 C) (03/18 0458) Pulse Rate:  [83-92] 85  (03/18 0458) Resp:  [18-20] 18  (03/18 0458) BP: (116-152)/(70-93) 152/93 mmHg (03/18 0458) SpO2:  [94 %-96 %] 96 % (03/18 0458)   Intake/Output Summary (Last 24 hours) at 03/30/11 0806 Last data filed at 03/30/11 0700  Gross per 24 hour  Intake    960 ml  Output   1100 ml  Net   -140 ml       General appearance: alert, cooperative and no distress Heart: regular rate and rhythm and S1, S2 normal Lungs: exp wheeze throughout Abdomen: soft, nontender Extremities: no edema Wound: small amount of drainage, serosang , upper pole of sternal incision  Lab Results: No results found for this basename: NA:2,K:2,CL:2,CO2:2,GLUCOSE:2,BUN:2,CREATININE:2,CALCIUM:2,MG:2,PHOS:2 in the last 72 hours No results found for this basename: AST:2,ALT:2,ALKPHOS:2,BILITOT:2,PROT:2,ALBUMIN:2 in the last 72 hours No results found for this basename: LIPASE:2,AMYLASE:2 in the last 72 hours No results found for this basename: WBC:2,NEUTROABS:2,HGB:2,HCT:2,MCV:2,PLT:2 in the last 72 hours No results found for this basename: CKTOTAL:4,CKMB:4,TROPONINI:4 in the last 72 hours No components found with this basename: POCBNP:3 No results found for this basename: DDIMER in the last 72 hours No results found for this basename: HGBA1C in the last 72 hours No results found for this basename: CHOL,HDL,LDLCALC,TRIG,CHOLHDL in the last 72 hours No results found for this basename: TSH,T4TOTAL,FREET3,T3FREE,THYROIDAB in the last 72 hours No results found for this basename: VITAMINB12,FOLATE,FERRITIN,TIBC,IRON,RETICCTPCT in the last 72 hours  Medications: Scheduled    .  aspirin EC  325 mg Oral Daily  . bisacodyl  10 mg Oral Daily   Or  . bisacodyl  10 mg Rectal Daily  . docusate sodium  200 mg Oral Daily  . enoxaparin (LOVENOX) injection  40 mg Subcutaneous Daily  . furosemide  40 mg Oral Daily  . metoprolol tartrate  25 mg Oral BID  . pantoprazole  40 mg Oral Q1200  . potassium chloride  20 mEq Oral BID  . simvastatin  20 mg Oral q1800  . sodium chloride  3 mL Intravenous Q12H  . sodium phosphate  1 enema Rectal Once  . DISCONTD: metoprolol tartrate  12.5 mg Oral BID     Radiology/Studies:  No results found.  INR: Will add last result for INR, ABG once components are confirmed Will add last 4 CBG results once components are confirmed  Assessment/Plan: S/P Procedure(s) (LRB): CORONARY ARTERY BYPASS GRAFTING (CABG) (N/A)  !. Doing well overall 2. Add xopenex for bronchospasm 3. Cont pulm toilet/ rehab 4. Low grade temp. , cont to monitor srernal incision, probably fatty necrosis, check CBC/WBC  LOS: 12 days    [name] 3/18/20138:06 AM    Patient seen and examined. Agree with above  Dressing on sternal wound is dry at present, no erythema or induration, suspect fat necrosis.  Hopefully ready for SNF in AM

## 2011-03-30 NOTE — Progress Notes (Signed)
Pt has selected bed at [facility]. CSW will continue to follow to facilitate d/c to SNF when pt is medically ready.  Baxter Flattery, MSW [phone]

## 2011-03-30 NOTE — Progress Notes (Signed)
Physical Therapy Treatment Patient Details Name: [name] MRN: [id number] DOB: 1940-11-11 Today's Date: 03/30/2011  PT Assessment/Plan  PT - Assessment/Plan Comments on Treatment Session: Better cognition today. Per pt's friend he was not ambulating well prior to surgery and that he only did a few days of therapy following his hip replacement. After he was discharged from physical therapy he reports that he didn't continue with his home exercise program. Likley his right hip is rather weak (pt not flexing hip/knee during gait with right hip in external rotation). I started him on SLR and heel slides to improve hip flexion strength. Pt agreeable for SNF for follow up therapies.  PT Plan: Discharge plan remains appropriate;Frequency remains appropriate Follow Up Recommendations: Skilled nursing facility Equipment Recommended: Defer to next venue PT Goals  Acute Rehab PT Goals PT Goal: Rolling Supine to Right Side - Progress: Progressing toward goal PT Goal: Supine/Side to Sit - Progress: Progressing toward goal PT Goal: Sit to Supine/Side - Progress: Progressing toward goal PT Goal: Sit to Stand - Progress: Progressing toward goal PT Goal: Stand to Sit - Progress: Progressing toward goal PT Transfer Goal: Bed to Chair/Chair to Bed - Progress: Progressing toward goal PT Goal: Stand - Progress: Progressing toward goal PT Goal: Ambulate - Progress: Progressing toward goal PT Goal: Perform Home Exercise Program - Progress: Progressing toward goal  PT Treatment Precautions/Restrictions  Precautions Precautions: Fall Precaution Comments: 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 precautions Mobility (including Balance) Bed Mobility Rolling Right: 3: Mod assist Rolling Right Details (indicate cue type and reason): sequencing cues and mod facilitation to bend knee and initiate trunk rotation as well as facilitation for follow through  Right Sidelying  to Sit: 1: +2 Total assist (50%) Right Sidelying to Sit Details (indicate cue type and reason): pt very stiff, difficulty bringing trunk up without use of upper extremities so +2totalpt50% to bring trunk upright Sit to Sidelying Right: 1: +2 Total assist (60%) Sit to Sidelying Right Details (indicate cue type and reason): assist to lower trunk and reposition as well as to elevate legs to bed; pt then rolled over onto back with modA Transfers Sit to Stand: 1: +2 Total assist;From bed;Without upper extremity assist (80%) Sit to Stand Details (indicate cue type and reason): cues for safe hand placement as well as bed elevated for ease of transfer; facilitation at hips/sacrum for extension and anterior translation of trunk over BOS Stand to Sit: 4: Min assist;To bed;Without upper extremity assist Stand to Sit Details: cues for safe hand placement and assist to lower slowly to bed Ambulation/Gait Ambulation/Gait Assistance Details (indicate cue type and reason): cues/facilitation for upright posture and increased knee/hip flexion on right for improved step length (pt tends to drag RLE with hip externally rotated especially with fatigue); still tends to weight shift laterally with difficulty going forward Ambulation Distance (Feet): 56 Feet Assistive device: Rolling walker Gait Pattern: Decreased hip/knee flexion - right;Decreased stance time - right;Step-to pattern;Decreased step length - right;Decreased step length - left (right hip externally rotated)    Exercise  General Exercises - Lower Extremity Straight Leg Raises: AROM;Right;10 reps;Supine Hip Flexion/Marching: AROM;Left;Right;Standing;5 reps End of Session PT - End of Session Equipment Utilized During Treatment: Gait belt Activity Tolerance: Patient tolerated treatment well Patient left: in bed;with call bell in reach;with family/visitor present Nurse Communication: Mobility status for transfers;Mobility status for  ambulation General Behavior During Session: [facility] for tasks performed Cognition: Impaired Cognitive Impairment: still a  little slower today but not hallucinating  [name],[name] 03/30/2011, 2:13 PM

## 2011-03-30 NOTE — Progress Notes (Signed)
DC'd sutures per MD order per hospital policy. Painted with benzoin and covered with steri strips. Patient tolerated well. Will continue to monitor. Lajuana Matte, RN

## 2011-03-30 NOTE — Progress Notes (Signed)
Clinical Social Work Department CLINICAL SOCIAL WORK PLACEMENT NOTE 03/30/2011  Patient:  DUEL, CONRAD  Account Number:  [id number] Admit date:  03/18/2011  Clinical Social Worker:  Baxter Flattery, LCSWA  Date/time:  03/27/2011 02:00 PM  Clinical Social Work is seeking post-discharge placement for this patient at the following level of care:      (*CSW will update this form in Epic as items are completed)   03/27/2011  Patient/family provided with [facility] Department of Clinical Social Work's list of facilities offering this level of care within the geographic area requested by the patient (or if unable, by the patient's family).  03/27/2011  Patient/family informed of their freedom to choose among providers that offer the needed level of care, that participate in Medicare, Medicaid or managed care program needed by the patient, have an available bed and are willing to accept the patient.  03/27/2011  Patient/family informed of [facility]' ownership interest in [facility], as well as of the fact that they are under no obligation to receive care at this facility.  PASARR submitted to [employer] on 03/27/2011 PASARR number received from [employer] on 03/30/2011  FL2 transmitted to all facilities in geographic area requested by pt/family on  03/27/2011 FL2 transmitted to all facilities within larger geographic area on   Patient informed that his/her managed care company has contracts with or will negotiate with  certain facilities, including the following:   [employer], [place], [facility]     Patient/family informed of bed offers received:  03/30/2011 Patient chooses bed at [facility] [place] Physician recommends and patient chooses bed at    Patient to be transferred to [facility] [place] [facility]   Patient to be transferred to facility by   The following physician request were entered in Epic:   Additional Comments:

## 2011-03-30 NOTE — Progress Notes (Signed)
OT Cancellation Note  Treatment cancelled today due to patient's refusal to participate: pt states he "literally just got back in bed" and requests therapy to return "later or tomorrow." Will do so as schedule allows. Thank you!  [name], OTR/L Pager: [phone] 03/30/2011    [name] 03/30/2011, 4:28 PM

## 2011-03-30 NOTE — Progress Notes (Signed)
CARDIAC REHAB PHASE I   PRE:  Rate/Rhythm: 86SR  BP:  Supine: 140/86  Sitting:   Standing:    SaO2: 97%RA  MODE:  Ambulation: 20  Ft outside of room   POST:  Rate/Rhythem: 97  BP:  Supine:   Sitting: 140/90  Standing:    SaO2: 97%RA [id number] Pt walked 20 ft outside of room with asst x 2 and rolling walker  On RA. Pt has an unsteady gait shuffling back and forth rather than stepping forward. Right foot turns outward and hits walker. Encouraged to stand upright and stay closer to walker. C/o dizziness. To recliner after walk with call bell.  [name]

## 2011-03-31 LAB — GLUCOSE, CAPILLARY
Glucose-Capillary: 119 mg/dL — ABNORMAL HIGH (ref 70–99)
Glucose-Capillary: 103 mg/dL — ABNORMAL HIGH (ref 70–99)
Glucose-Capillary: 126 mg/dL — ABNORMAL HIGH (ref 70–99)

## 2011-03-31 NOTE — Progress Notes (Signed)
[id number] Attempted to get pt to ambulate, he refused stating that he just could not do it. Even after trying to convince him,he refused. Completed discharge education with pt. He declines Outpt CRP due to no transportation.

## 2011-03-31 NOTE — Progress Notes (Signed)
                    [address]            [place] [address]          [phone]     8 Days Post-Op Procedure(s) (LRB): CORONARY ARTERY BYPASS GRAFTING (CABG) (N/A)  Subjective: Feels well, no complaints.  Objective: Vital signs in last 24 hours: Patient Vitals for the past 24 hrs:  BP Temp Temp src Pulse Resp SpO2 Weight  03/31/11 0901 - - - - - 95 % -  03/31/11 0614 151/88 mmHg 98 F (36.7 C) Oral 83  20  94 % 89.948 kg (198 lb 4.8 oz)  03/31/11 0500 - - - - - - 89.812 kg (198 lb)  03/30/11 2152 123/79 mmHg 98.7 F (37.1 C) Oral 92  18  94 % -  03/30/11 1350 114/77 mmHg 98.1 F (36.7 C) Oral 84  20  96 % -   Current Weight  03/31/11 89.948 kg (198 lb 4.8 oz)     Intake/Output from previous day: 03/18 0701 - 03/19 0700 In: 360 [P.O.:360] Out: 650 [Urine:650]    PHYSICAL EXAM:  Heart: RRR Lungs: rare wheezes, generally clear Wound: clean and dry, no sternal drainage Extremities: no edema  Lab Results: CBC: Basename 03/30/11 0920  WBC 10.3  HGB 11.7*  HCT 35.3*  PLT 316   BMET: No results found for this basename: NA:2,K:2,CL:2,CO2:2,GLUCOSE:2,BUN:2,CREATININE:2,CALCIUM:2 in the last 72 hours  PT/INR: No results found for this basename: LABPROT,INR in the last 72 hours   Assessment/Plan: S/P Procedure(s) (LRB): CORONARY ARTERY BYPASS GRAFTING (CABG) (N/A) CV- stable, continue current meds. Vol overload- diurese Sternal drainage resolved. Hopefully to SNF today if bed available.   LOS: 13 days    Kaidynce Pfister H 03/31/2011

## 2011-04-01 LAB — GLUCOSE, CAPILLARY: Glucose-Capillary: 100 mg/dL — ABNORMAL HIGH (ref 70–99)

## 2011-04-01 NOTE — Progress Notes (Signed)
D/c delayed due to FL2 not signed. CSW located MD to sign FL2. Packet is now in [place] and Charity fundraiser advised. [facility] called for transport and CSW signing off.  Baxter Flattery, MSW [phone]

## 2011-04-01 NOTE — Progress Notes (Signed)
                    [address]            [place] [address]          [phone]     9 Days Post-Op Procedure(s) (LRB): CORONARY ARTERY BYPASS GRAFTING (CABG) (N/A)  Subjective: Stable night, no complaints, just tired.  Objective: Vital signs in last 24 hours: Patient Vitals for the past 24 hrs:  BP Temp Temp src Pulse Resp SpO2 Weight  04/01/11 0619 123/81 mmHg 98.6 F (37 C) Oral 82  24  93 % 81.103 kg (178 lb 12.8 oz)  03/31/11 2145 112/72 mmHg 98.8 F (37.1 C) Oral 88  16  95 % -  03/31/11 2131 - - - - - 96 % -  03/31/11 1445 108/76 mmHg 98.6 F (37 C) Oral 78  18  95 % -  03/31/11 0901 - - - - - 95 % -   Current Weight  04/01/11 81.103 kg (178 lb 12.8 oz)     Intake/Output from previous day: 03/19 0701 - 03/20 0700 In: 240 [P.O.:240] Out: 1325 [Urine:1325]    PHYSICAL EXAM:  Heart: RRR Lungs: clear Wound: clean and dry, no sternal drainage noted. No erythema. Extremities: no LE edema  Lab Results: CBC: Basename 03/30/11 0920  WBC 10.3  HGB 11.7*  HCT 35.3*  PLT 316   BMET: No results found for this basename: NA:2,K:2,CL:2,CO2:2,GLUCOSE:2,BUN:2,CREATININE:2,CALCIUM:2 in the last 72 hours  PT/INR: No results found for this basename: LABPROT,INR in the last 72 hours   Assessment/Plan: S/P Procedure(s) (LRB): CORONARY ARTERY BYPASS GRAFTING (CABG) (N/A) For tx SNF when bed ready.   LOS: 14 days    Priyana Mccarey H 04/01/2011

## 2011-04-01 NOTE — Progress Notes (Signed)
Pt discharge instructions sent with EMS to rehab. IV d/c. Site WNL. Discharged to rehab via EMS. [name]

## 2011-04-01 NOTE — Progress Notes (Signed)
   CARE MANAGEMENT NOTE 04/01/2011  Patient:  [name], [name]   Account Number:  [id number]  Date Initiated:  03/19/2011  Documentation initiated by:  Junius Creamer  Subjective/Objective Assessment:   adm w ch pain, pos card cath for cvts consult     Action/Plan:   lives w fam, pcp dr Drue Novel   Anticipated DC Date:  03/30/2011   Anticipated DC Plan:  SKILLED NURSING FACILITY  In-house referral  Clinical Social Worker      DC Planning Services  CM consult      Choice offered to / List presented to:             Status of service:  Completed, signed off Medicare Important Message given?   (If response is "NO", the following Medicare IM given date fields will be blank) Date Medicare IM given:   Date Additional Medicare IM given:    Discharge Disposition:  SKILLED NURSING FACILITY  Per UR Regulation:    If discussed at Long Length of Stay Meetings, dates discussed:    Comments:  04/01/11 Azizah Lisle,RN,BSN PT DISCHARGING TO SNF TODAY, PER CSW ARRANGMENTS. Phone #[phone]   03/25/11 Lucilia Yanni,RN,BSN [phone] S/P CABG X 4 ON 03/24/11.  PTA, PT INDEPENDENT, LIVES WITH 91 YO MOTHER ON THE SECOND FLOOR OF HOME.  PMH OF RECENT HIP REPLACEMENT, PER RN.    PT AND OT CONSULT DONE TODAY, RECOMMENDATION IS FOR SNF.  WILL CONSULT CSW TO FACILITATE DC TO SNF FOR SHORT TERM REHAB WHEN MEDICALLY STABLE FOR DISCHARGE. Phone #[phone]   3/7 debbie dowell rn,bsn [phone]

## 2011-04-01 NOTE — Progress Notes (Signed)
PT Cancellation Note  Spoke with pt who reports he is about to leave for SNF. Will defer further PT to SNF.  Ivonne Andrew PT, DPT  Pager: [phone]

## 2011-04-01 NOTE — Progress Notes (Addendum)
9 Days Post-Op Procedure(s) (LRB): CORONARY ARTERY BYPASS GRAFTING (CABG) (N/A)  Subjective: Patient asleep and awakened. No complaints.  Objective: Vital signs in last 24 hours: Patient Vitals for the past 24 hrs:  BP Temp Temp src Pulse Resp SpO2 Weight  04/01/11 0619 123/81 mmHg 98.6 F (37 C) Oral 82  24  93 % 178 lb 12.8 oz (81.103 kg)  03/31/11 2145 112/72 mmHg 98.8 F (37.1 C) Oral 88  16  95 % -  03/31/11 2131 - - - - - 96 % -  03/31/11 1445 108/76 mmHg 98.6 F (37 C) Oral 78  18  95 % -  03/31/11 0901 - - - - - 95 % -    Current Weight  04/01/11 178 lb 12.8 oz (81.103 kg)      Intake/Output from previous day: 03/19 0701 - 03/20 0700 In: 240 [P.O.:240] Out: 1325 [Urine:1325]   Physical Exam:  Cardiovascular: RRR, no murmurs, gallops, or rubs. Pulmonary: Clear to auscultation bilaterally; no rales, wheezes, or rhonchi. Abdomen: Soft, non tender, bowel sounds present. Extremities: No real lower extremity edema. Wounds: Clean and dry.  No erythema or signs of infection.  Lab Results: CBC: Basename 03/30/11 0920  WBC 10.3  HGB 11.7*  HCT 35.3*  PLT 316   BMET: No results found for this basename: NA:2,K:2,CL:2,CO2:2,GLUCOSE:2,BUN:2,CREATININE:2,CALCIUM:2 in the last 72 hours  PT/INR: No results found for this basename: LABPROT,INR in the last 72 hours ABG:  INR: Will add last result for INR, ABG once components are confirmed Will add last 4 CBG results once components are confirmed  Assessment/Plan:  1. CV - SR.Continue current medications. 2.  Pulmonary - Encourage incentive spirometer. 3. Volume Overload - Continue with diuresis. 4.  Mild acute blood loss anemia - Last H/H 11.7/35.3. 5.Surgically stable for discharge when SNF bed available.   ZIMMERMAN,DONIELLE MPA-C 04/01/2011   Pt seen and examined. No further drainage from sternal wound. Wound is clean dry and intact.  To SNF today

## 2011-04-15 ENCOUNTER — Encounter: Payer: Self-pay | Admitting: Physician Assistant

## 2011-04-15 ENCOUNTER — Ambulatory Visit (INDEPENDENT_AMBULATORY_CARE_PROVIDER_SITE_OTHER): Payer: Medicare PPO | Admitting: Physician Assistant

## 2011-04-15 VITALS — BP 132/60 | HR 66 | Resp 18 | Ht 72.0 in | Wt 198.4 lb

## 2011-04-15 DIAGNOSIS — E785 Hyperlipidemia, unspecified: Secondary | ICD-10-CM

## 2011-04-15 DIAGNOSIS — F17200 Nicotine dependence, unspecified, uncomplicated: Secondary | ICD-10-CM

## 2011-04-15 DIAGNOSIS — R443 Hallucinations, unspecified: Secondary | ICD-10-CM

## 2011-04-15 DIAGNOSIS — I10 Essential (primary) hypertension: Secondary | ICD-10-CM

## 2011-04-15 DIAGNOSIS — I2510 Atherosclerotic heart disease of native coronary artery without angina pectoris: Secondary | ICD-10-CM

## 2011-04-15 NOTE — Patient Instructions (Signed)
Your physician recommends that you schedule a follow-up appointment in: 6 weeks with Dr Antoine Poche Your physician recommends that you return for lab work in: 6 weeks (lipid and liver panel)

## 2011-04-15 NOTE — Progress Notes (Signed)
[address] [place], [place]  [address] Phone: [phone] Fax:  [phone]  Date:  04/15/2011   Name:  [name]       DOB:  December 10, 1940 MRN:  [id number]  PCP:  Dr. Drue Novel  Primary Cardiologist:  Dr. Rollene Rotunda  Primary Electrophysiologist:  None    History of Present Illness: [name] is a 71 y.o. male who returns for post hospital follow up.  He has a h/o HTN, GERD, carotid stenosis, s/p R CEA in 2010, tobacco and ETOH abuse, DJD.  He was admitted 3/6-3/17.  He presented with CP.  LHC 03/19/11 demonstrated multivessel CAD, EF 60-65%.  Echo 03/19/11: EF 55%, grade 1 diast dysfxn, trivial MR, mild LAE, mild RAE, PASP 26.  He underwent CABG by Dr. Dorris Fetch 03/23/11: L-LAD, S-AM, S-OM3, S-D1.  Post op course was uneventful.  He maintained NSR.  Labs: K 4.2, creatinine 1.00, TC 125, TG 157, HDL 24, LDL 70, Hgb 11.7, TSH 1.575.  Doing ok.  Notes hallucinations at night.  No more smoking.  Chest is sore.  Breathing ok.  Working with PT/OT.  No orthopnea, PND, syncope, palpitations or edema.    Past Medical History  Diagnosis Date  . Carotid arterial disease     s/p RCEA 11/2008 (Note: hx of negative cardiac nuc 03/2009)  . Hypertension   . Colonic polyp     cscopes 08/18/05 tics,polyps.Marland Kitchenall hyperplastic  . GERD (gastroesophageal reflux disease)     Barrets Esop. (per bx 08/2005) and HH f/u Dr.Schooler  . Anal warts   . Anal fissure   . Hemorrhoids   . 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 Panic attack   . Osteoarthritis   . Depression   . H/O ETOH abuse     Former. Quit ~2012    Current Outpatient Prescriptions  Medication Sig Dispense Refill  . aspirin 325 MG tablet Take 325 mg by mouth daily.        Marland Kitchen docusate sodium (COLACE) 100 MG capsule Take 100 mg by mouth 2 (two) times daily.      Marland Kitchen LORazepam (ATIVAN) 0.5 MG tablet Take 0.5 mg by mouth 2 (two) times daily.      . metoprolol tartrate  (LOPRESSOR) 25 MG tablet Take 1 tablet (25 mg total) by mouth 2 (two) times daily.  30 tablet  1  . omeprazole (PRILOSEC) 40 MG capsule Take 40 mg by mouth daily.      . pravastatin (PRAVACHOL) 40 MG tablet Take 40 mg by mouth daily.        Allergies: Allergies  Allergen Reactions  . Sertraline Hcl     REACTION: tongue bleed    History  Substance Use Topics  . Smoking status: Former Smoker -- 1.0 packs/day for 52 years    Types: Cigarettes  . Smokeless tobacco: Never Used  . Alcohol Use: No     H/o ETOH abuse (fifth of sherry daily), currently not drinking     PHYSICAL EXAM: VS:  BP 132/60  Pulse 66  Resp 18  Ht 6' (1.829 m)  Wt 198 lb 6.4 oz (89.994 kg)  BMI 26.91 kg/m2 Well nourished, well developed, in no acute distress HEENT: normal Neck: no JVD Cardiac:  normal S1, S2; RRR; no murmur Chest: median sternotomy wound well healed without erythema or discharge Lungs:  Decreased breath sounds bilaterally, no wheezing, rhonchi or rales Abd: soft, nontender,  no hepatomegaly Ext: no edema; right groin without hematoma or bruit  Skin: warm and dry Neuro:  CNs 2-12 intact, no focal abnormalities noted  EKG:  NSR, HR 77, normal axis, NSSTTW changes  ASSESSMENT AND PLAN:  1. Coronary atherosclerosis of native coronary artery  Doing well s/p CABG.  He is still at [facility].  Will see if we can get him into cardiac rehab at next follow up.  Follow up with Dr. Rollene Rotunda in 6 weeks.   2. Hyperlipidemia  Check L/L in 6 weeks.   3. HYPERTENSION  Controlled.  Continue current therapy.    4. TOBACCO ABUSE  Luckily, he has stopped.   5. Hallucinations  I have asked him to follow up with Dr. Donette Larry who is following him at [facility].     Signed, Tereso Newcomer, PA-C  10:36 AM 04/15/2011

## 2011-04-17 ENCOUNTER — Other Ambulatory Visit: Payer: Self-pay | Admitting: Thoracic Surgery (Cardiothoracic Vascular Surgery)

## 2011-04-17 DIAGNOSIS — I2510 Atherosclerotic heart disease of native coronary artery without angina pectoris: Secondary | ICD-10-CM

## 2011-04-23 ENCOUNTER — Ambulatory Visit
Admission: RE | Admit: 2011-04-23 | Discharge: 2011-04-23 | Disposition: A | Discharge status: Home / Self Care | Payer: Medicare PPO | Source: Ambulatory Visit | Attending: Thoracic Surgery (Cardiothoracic Vascular Surgery) | Admitting: Thoracic Surgery (Cardiothoracic Vascular Surgery)

## 2011-04-23 ENCOUNTER — Ambulatory Visit (INDEPENDENT_AMBULATORY_CARE_PROVIDER_SITE_OTHER): Payer: Self-pay | Admitting: Thoracic Surgery (Cardiothoracic Vascular Surgery)

## 2011-04-23 ENCOUNTER — Encounter: Payer: Self-pay | Admitting: Thoracic Surgery (Cardiothoracic Vascular Surgery)

## 2011-04-23 VITALS — BP 117/69 | HR 81 | Resp 18 | Ht 72.0 in | Wt 195.0 lb

## 2011-04-23 DIAGNOSIS — I2510 Atherosclerotic heart disease of native coronary artery without angina pectoris: Secondary | ICD-10-CM

## 2011-04-23 DIAGNOSIS — Z951 Presence of aortocoronary bypass graft: Secondary | ICD-10-CM

## 2011-04-23 NOTE — Progress Notes (Signed)
HPI:  Mr. [name] returns for a scheduled postoperative followup visit. He had coronary bypass grafting x4 on March 11. Postoperatively he did not have any major complications this progress was relatively slow. He was discharged to [facility] on March 20.  He states that his major complaint is he feels tired all the time. He says is not having a lot of incisional pain, although he does state Ultram occasionally. He says he is just now getting where he can do some of the required physical therapy exercises at [facility]  Past Medical History  Diagnosis Date  . Carotid arterial disease     s/p RCEA 11/2008 (Note: hx of negative cardiac nuc 03/2009)  . Hypertension   . Colonic polyp     cscopes 08/18/05 tics,polyps.Marland Kitchenall hyperplastic  . GERD (gastroesophageal reflux disease)     Barrets Esop. (per bx 08/2005) and HH f/u Dr.Schooler  . Anal warts   . Anal fissure   . Hemorrhoids   . 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 Panic attack   . Osteoarthritis   . Depression   . H/O ETOH abuse     Former. Quit ~2012    Current Outpatient Prescriptions  Medication Sig Dispense Refill  . aspirin 325 MG tablet Take 325 mg by mouth daily.        Marland Kitchen docusate sodium (COLACE) 100 MG capsule Take 100 mg by mouth 2 (two) times daily.      Marland Kitchen LORazepam (ATIVAN) 0.5 MG tablet Take 0.5 mg by mouth 2 (two) times daily.      . metoprolol tartrate (LOPRESSOR) 25 MG tablet Take 1 tablet (25 mg total) by mouth 2 (two) times daily.  30 tablet  1  . omeprazole (PRILOSEC) 40 MG capsule Take 40 mg by mouth daily.      . pravastatin (PRAVACHOL) 40 MG tablet Take 40 mg by mouth daily.      . traMADol (ULTRAM) 50 MG tablet Take 50 mg by mouth every 6 (six) hours as needed.        Physical Exam BP 117/69  Pulse 81  Resp 18  Ht 6' (1.829 m)  Wt 195 lb (88.451 kg)  BMI 26.45 kg/m2  SpO2 95% Gen. 71 year old male in no acute distress Lungs clear with equal breath sound  bilaterally Cardiac regular rate and rhythm, normal S1 and S2, no rub Sternum stable, incision clean dry and intact Leg incisions intact, no peripheral edema  Diagnostic Tests:  Chest x-ray shows improving aeration in the setting of baseline COPD  Impression: Mr. [name] is progressing at about the rate would be expected at this point in time. He is still requiring physical therapy, but feels like he may be ready to go home in about a week or so, which seems reasonable to me. He is only intermittently requiring narcotics for pain. His major issue is that he feels tired and fatigued. I explained to him that it is very common to have a lack of energy difficulty sleeping a poor appetite for the first several weeks after the procedure. He now getting to the point where he should start noticing some improvement in his energy level and stamina over the next 2-3 weeks.  He wants to start driving when he leaves [facility]. I advised him that he should not drive as long as he is having to continue to take narcotics. And certainly should not drive until he is feeling alert. He also  is not to lift any objects weight greater than 10 pounds for another 3 weeks.  Plan: We'll plan for him to stay at [facility] for another week.  He will continue to follow with Dr. Antoine Poche for his cardiac care. I will be happy to see him back any time that I can be of any further assistance.

## 2011-05-07 ENCOUNTER — Telehealth: Payer: Self-pay | Admitting: Internal Medicine

## 2011-05-07 NOTE — Telephone Encounter (Signed)
Roe Coombs, occupational therapist, called to let Dr. Drue Novel know that the patient fell yesterday when he stood up from sitting and got dizzy. Roe Coombs states that there was no injury or pain immediately after fall, but pt states he had sharp pain in his chest for a few seconds today. Patient rated his pain at 8/10. Roe Coombs would like Dr. Drue Novel or Lillia Abed to call him back @ [phone]

## 2011-05-07 NOTE — Telephone Encounter (Signed)
CP, dizzines: Needs ER evaluation

## 2011-05-07 NOTE — Telephone Encounter (Signed)
Please advise 

## 2011-05-07 NOTE — Telephone Encounter (Signed)
Left msg on don's vmail with instructions & instructed him to call back if he has any questions.

## 2011-05-13 ENCOUNTER — Ambulatory Visit (INDEPENDENT_AMBULATORY_CARE_PROVIDER_SITE_OTHER): Payer: Medicare PPO | Admitting: Internal Medicine

## 2011-05-13 ENCOUNTER — Encounter: Payer: Self-pay | Admitting: Internal Medicine

## 2011-05-13 VITALS — BP 110/68 | HR 79 | Temp 98.0°F

## 2011-05-13 DIAGNOSIS — F329 Major depressive disorder, single episode, unspecified: Secondary | ICD-10-CM

## 2011-05-13 DIAGNOSIS — I10 Essential (primary) hypertension: Secondary | ICD-10-CM

## 2011-05-13 DIAGNOSIS — I2510 Atherosclerotic heart disease of native coronary artery without angina pectoris: Secondary | ICD-10-CM

## 2011-05-13 DIAGNOSIS — R5383 Other fatigue: Secondary | ICD-10-CM

## 2011-05-13 DIAGNOSIS — R269 Unspecified abnormalities of gait and mobility: Secondary | ICD-10-CM

## 2011-05-13 NOTE — Assessment & Plan Note (Signed)
He seems to have a major problem with his gait, he is shuffling, has a hard time transferring, we are checking a B12 and folic acid today. He'll continue with physical therapy at home, reassess on return to the office.

## 2011-05-13 NOTE — Assessment & Plan Note (Addendum)
s/p heart surgery  03/23/2011. He's not recuperating  as quickly as he would like Chart is reviewed, recent TSH was okay, A1c at that time of the surgery was normal. His BP does not seem to be overcontrol, he is not vol overload and not depressed. Plan: BMP, CBC, B12, folic acid.(h/o ETOH) reassess in 1 month

## 2011-05-13 NOTE — Assessment & Plan Note (Signed)
BP okay today, he does not seem to be overcontrolled

## 2011-05-13 NOTE — Patient Instructions (Signed)
Call anytime if you feel worse Continue working with physical therapy Come back in one month

## 2011-05-13 NOTE — Progress Notes (Signed)
  Subjective:    Patient ID: [name], male    DOB: 07-19-1940, 71 y.o.   MRN: [id number]  HPI Followup from recent hospitalization: Presented to the hospital 03/18/2011 chest pain Had a CABG  03/23/2011. Post op was complicated by anemia and low platelets, did not requiring transfusion. He also was confused. He was eventually D/C  to SNF 03/30/2011, had a physician check in on him there. Saw cardiology 04/15/2011, in general he was doing okay, daily to report hallucinations at night. He is here with his girlfriend for follow up  2)  ! * Nicotine Patches 3)  ! * Zyban  Past History:  Past Medical History: CAD, CABG 03-2011 Carotid Artery Dz, s/p surgery 11-2008 Hypertension ------ COLONIC POLYPS, Cscope 08-18-05: tics, polyps... all hyperplastic GERD, Barretts Esoph. (per Bx 08-2005)  and HH--f/u Dr Bosie Clos H/o Anal warts, anal fissures and hemorrhoids  ------ SKIN Ca :  SQUAMOUS CELL , L side of face; s/p removal aprox 2009 BPH----- f/u w/ Dr Etta Grandchild, s/p procedure 01-30-2008 Hx of PANIC ATTACK  OA Depression  Past Surgical History: CABG  3-13  Right carotid endarterectomy with Dacron patch angioplasty 11-2008 Appendectomy Knee surgery Foot surgery (fx.) Hip Replacement --R-- due to OA  (2005) Skin tags excised   TURP wears a contact lens L, cataract surgery R... good vision  Family History: Father: Lung CA mets to brain (died) Mother: HTN,  arthritis CAD - no HTN - M DM - P. Aunt stroke - PGF colon Ca - no prostate Ca - no  Social History: Retired, divorce, 2 kids, they live in [place], [place] Has a girlfriend , mom lives w/ him,91 y/o bed ridden he is a single child  Quit tobacco 03-2011 + ETOH abuse --reports no ETOH since 03-2011  Drug use-no Regular exercise-no   Review of Systems He left SNF 3 days ago, currently living at home with his 18 year old mother. He quit tobacco and alcohol. He is concerned because he is not getting any better  as far as his energy ---> feels as tired as he did about 3 - 4 weeks ago. He is doing physical therapy at home. He denies specifically fever or chills, no shortness of breath or lower extremity edema. No chest pain other than at the site of heart surgery. No nausea vomiting, had diarrhea one time but it resolved. Appetite is not the best but he is forcing himself to eat. Occasionally he gets sad and down but no overt depression. No suicidal. Denies any myalgias. Has not gained weight since he left the hospital.     Objective:   Physical Exam  General -- alert, well-developed, looks chronically he'll which is his baseline, he does seem to be extremity fatigue.  Neck --no JVD Lungs -- normal respiratory effort, no intercostal retractions, no accessory muscle use, and normal breath sounds.   Heart-- normal rate, regular rhythm, no murmur, and no gallop.   Abdomen--soft, non-tender, no distention. Extremities-- no pretibial edema bilaterally  Neurologic-- alert & oriented X3, has generalize weakness, his gait is very slow and shuffling. Has a difficult time transferring from the chair to the examining table Psych--  not anxious appearing and not depressed appearing.       Assessment & Plan:  Today , I spent more than 45 min with the patient, >50% of the time counseling, and /or reviewing the chart and labs ordered by other providers

## 2011-05-13 NOTE — Assessment & Plan Note (Signed)
CAD, recovering from a CABG3-2013. He quit tobacco. Status post rehabilitation , d/c from [facility] about 3 days ago, reportedly getting cardiac rehabilitation at home. He was complaining of some hallucinations at night, today he reports hallucinations have decreased. I am concerned about the fatigue, see below

## 2011-05-13 NOTE — Assessment & Plan Note (Signed)
No overt depression at the present time, we'll have to ask about depression every time he comes. Even if he is a slightly depressed, I would be hesitant to prescribe SSRI with his history of recent hallucinations.

## 2011-05-19 NOTE — Telephone Encounter (Signed)
[name] from [facility] called to let Dr. Drue Novel know that the pt had another fall yesterday. The pt was standing up from being on the lift chair. Pt has 2 abrasions on his back, but they did not break the skin. Pt denies any dizziness or pain and does not want to go to the Dr. Or ED. Dorinda Hill would like to know if he can get an order for an additional OT session this week-Friday.

## 2011-05-19 NOTE — Telephone Encounter (Signed)
Please advise 

## 2011-05-19 NOTE — Telephone Encounter (Signed)
Phoned Don & did not get an answer.

## 2011-05-19 NOTE — Telephone Encounter (Signed)
Yes please, in fact, if he needs to extend day PT/OT for few more weeks that is okay. If [facility] thinks he needs to go back to a rehabilitation unit, let me know

## 2011-05-20 NOTE — Telephone Encounter (Signed)
Left detailed msg on don's phone.

## 2011-05-21 NOTE — Telephone Encounter (Signed)
Spoke with Roe Coombs, & he states that the physical therapist said the pt has 2 more weeks of therapy scheduled but he states that he is still at high risk of falling & also has memory issues. The physical therapist advises that someone be with him at all times.The pt has 1 more visit of OT.

## 2011-05-21 NOTE — Telephone Encounter (Signed)
I called the patient, I expressed to him my concerns about the frequent falls, seems to me that he needs to be re-admitted to rehabilitation. Patient strongly declined that idea, I then suggested probably having help  24/ 7 but he said that is too expensive. He said also that he left some papers from the [place] for me to fill, I have not seen them yet. Plan: Lillia Abed, please call [facility] and ask for an  evaluation  Also call [facility] and ask for extension of PT/ OT

## 2011-05-22 NOTE — Telephone Encounter (Signed)
Left a msg with [facility] & left detailed msg for don.

## 2011-05-26 DIAGNOSIS — Z0279 Encounter for issue of other medical certificate: Secondary | ICD-10-CM

## 2011-05-28 ENCOUNTER — Other Ambulatory Visit: Payer: Medicare PPO

## 2011-05-28 ENCOUNTER — Telehealth: Payer: Self-pay | Admitting: *Deleted

## 2011-05-28 ENCOUNTER — Telehealth: Payer: Self-pay | Admitting: Internal Medicine

## 2011-05-28 ENCOUNTER — Ambulatory Visit: Payer: Medicare PPO | Admitting: Cardiology

## 2011-05-28 NOTE — Telephone Encounter (Signed)
1. We got a phone call today from a [facility] nurse, pt still has memory issues, elevated BP.  See previous phone notes , he has been reluctant to be readmitted to rehab (which is really what he needs) , cannot afford private help.We already notified [facility]. [facility] also [facility]. Plan: Will call him in AM Will ask HH to check labs

## 2011-05-28 NOTE — Telephone Encounter (Signed)
[name] from [facility] called & stated that she checked on the pt again today & he was still having memory problems, elevated BP, & at risk of falling. She wanted to know if they need to extend his therapy or discharge them. I explained to her that you contacted the pt about his last week. What do you advise?

## 2011-05-28 NOTE — Telephone Encounter (Signed)
Labs ordered 05-13-11 not done , needs to RTC (no fasting)

## 2011-05-28 NOTE — Telephone Encounter (Signed)
Error

## 2011-05-28 NOTE — Telephone Encounter (Signed)
See phone call from today 05-28-11

## 2011-05-29 NOTE — Telephone Encounter (Signed)
LMOVM to return my call  

## 2011-05-29 NOTE — Telephone Encounter (Signed)
The labs will be done on Monday.

## 2011-05-29 NOTE — Telephone Encounter (Signed)
Spoke w/ pt: States that feels better, less weak, "good days and bad days". Again declined readmission, he is  working on getting some help through the [place]. Asked pt to call me if needed Plan: Ask Frances Furbish to extend PT/OT and to get non fasting labs and send them to me: CBC BMP-- dx CAD Folic acid, B12-- dx fatigue JP

## 2011-06-01 ENCOUNTER — Ambulatory Visit: Payer: Medicare PPO | Admitting: Internal Medicine

## 2011-06-01 ENCOUNTER — Other Ambulatory Visit: Payer: Self-pay | Admitting: Internal Medicine

## 2011-06-01 DIAGNOSIS — Z0289 Encounter for other administrative examinations: Secondary | ICD-10-CM

## 2011-06-01 NOTE — Telephone Encounter (Signed)
Last OV 05-13-11, .Please advise

## 2011-06-01 NOTE — Telephone Encounter (Signed)
Ok to refill 

## 2011-06-03 NOTE — Telephone Encounter (Signed)
Per dr. Drue Novel. Refill done.

## 2011-06-15 ENCOUNTER — Ambulatory Visit (INDEPENDENT_AMBULATORY_CARE_PROVIDER_SITE_OTHER): Payer: Medicare PPO | Admitting: Internal Medicine

## 2011-06-15 VITALS — BP 112/70 | HR 70 | Temp 98.0°F

## 2011-06-15 DIAGNOSIS — I2510 Atherosclerotic heart disease of native coronary artery without angina pectoris: Secondary | ICD-10-CM

## 2011-06-15 DIAGNOSIS — F329 Major depressive disorder, single episode, unspecified: Secondary | ICD-10-CM

## 2011-06-15 DIAGNOSIS — R5383 Other fatigue: Secondary | ICD-10-CM

## 2011-06-15 MED ORDER — PRAVASTATIN SODIUM 40 MG PO TABS: 40.0000 mg | ORAL_TABLET | Freq: Every day | ORAL | Status: DC

## 2011-06-15 NOTE — Assessment & Plan Note (Signed)
Seems to be gradually improving. I ordered several labs at the last office visit, to my knowledge there were not done  Although pt reports his blood was checked, refused to let me check his blood today. I encouraged him to let me know who did his labs and I'll get results

## 2011-06-15 NOTE — Assessment & Plan Note (Signed)
Seems to be doing okay

## 2011-06-15 NOTE — Patient Instructions (Signed)
Please come back in 3-4 months for checkup

## 2011-06-15 NOTE — Assessment & Plan Note (Signed)
Slightly depressed, mostly due to finances, denies need to take any medication at this point

## 2011-06-15 NOTE — Progress Notes (Signed)
  Subjective:    Patient ID: [name], male    DOB: July 13, 1940, 71 y.o.   MRN: [id number]  HPI ROV, here with Marchelle Folks, his significantly other. Followup from previous visit. In general feeling better, still has days like today when he does not feel he can walk much. Still having to use a wheelchair.  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 all hyperplastic GERD, Barretts Esoph. (per Bx 08-2005)  and HH--f/u Dr Bosie Clos H/o Anal warts, anal fissures and hemorrhoids   ------ SKIN Ca :  SQUAMOUS CELL , L side of face; s/p removal aprox 2009 BPH----- f/u w/ Dr Etta Grandchild, s/p procedure 01-30-2008 Hx of PANIC ATTACK   OA Depression  Past Surgical History: CABG  3-13   Right carotid endarterectomy with Dacron patch angioplasty 11-2008 Appendectomy Knee surgery Foot surgery (fx.) Hip Replacement --R-- due to OA  (2005) Skin tags excised    TURP wears a contact lens L, cataract surgery R... good vision  Family History: Father: Lung CA mets to brain (died) Mother: HTN,  arthritis CAD - no HTN - M DM - P. Aunt stroke - PGF colon Ca - no prostate Ca - no  Social History: Retired, divorce, 2 kids, they live in [place], [place] Has a girlfriend, Marchelle Folks , mom lives w/ him,91 y/o bed ridden he is a single child   Quit tobacco 03-2011 + ETOH abuse --reports no ETOH since 03-2011   Drug use-no Regular exercise-no    Review of Systems Denies any recent falls Appetite is very good No mental status changes or hallucinations. When asked about depression he admits to be slightly depressed but not suicidal, most of depression comes from his finalcial situation. He has not going back to smoke or drink alcohol.     Objective:   Physical Exam  General -- alert, well-developed. No apparent distress,  Sitting in a wheelchair Lungs -- normal respiratory effort, no  intercostal retractions, no accessory muscle use, and normal breath sounds.   Heart-- normal rate, regular rhythm, no murmur, and no gallop.   Extremities-- no pretibial edema bilaterally  Neurologic-- alert & oriented X3 and strength normal in all extremities. Psych-- Cognition and judgment appear intact. Alert and cooperative with normal attention span and concentration.  not anxious appearing and not depressed appearing.       Assessment & Plan:

## 2011-06-16 ENCOUNTER — Telehealth: Payer: Self-pay | Admitting: Internal Medicine

## 2011-06-16 ENCOUNTER — Encounter: Payer: Self-pay | Admitting: Internal Medicine

## 2011-06-16 NOTE — Telephone Encounter (Signed)
Receive results from 06/02/2011 CBC show hemoglobin of 12.5, potassium 4.0, creatinine 0.9.  B12 and folate acid normal. Please notify patient, labs were okay.

## 2011-06-17 NOTE — Telephone Encounter (Signed)
Discussed with pt

## 2011-06-24 ENCOUNTER — Encounter: Payer: Self-pay | Admitting: Internal Medicine

## 2011-07-02 ENCOUNTER — Ambulatory Visit: Payer: Medicare PPO | Admitting: Cardiology

## 2011-07-02 ENCOUNTER — Other Ambulatory Visit: Payer: Medicare PPO

## 2011-07-03 ENCOUNTER — Ambulatory Visit (INDEPENDENT_AMBULATORY_CARE_PROVIDER_SITE_OTHER): Payer: Medicare PPO | Admitting: Cardiology

## 2011-07-03 ENCOUNTER — Encounter: Payer: Self-pay | Admitting: Cardiology

## 2011-07-03 VITALS — BP 123/74 | HR 67 | Ht 73.0 in | Wt 194.8 lb

## 2011-07-03 DIAGNOSIS — E785 Hyperlipidemia, unspecified: Secondary | ICD-10-CM

## 2011-07-03 DIAGNOSIS — Z48812 Encounter for surgical aftercare following surgery on the circulatory system: Secondary | ICD-10-CM

## 2011-07-03 DIAGNOSIS — I2510 Atherosclerotic heart disease of native coronary artery without angina pectoris: Secondary | ICD-10-CM

## 2011-07-03 DIAGNOSIS — I10 Essential (primary) hypertension: Secondary | ICD-10-CM

## 2011-07-03 DIAGNOSIS — M6281 Muscle weakness (generalized): Secondary | ICD-10-CM

## 2011-07-03 NOTE — Assessment & Plan Note (Signed)
The patient has no new sypmtoms.  No further cardiovascular testing is indicated.  We will continue with aggressive risk reduction and meds as listed.  

## 2011-07-03 NOTE — Assessment & Plan Note (Signed)
His lipids will be assessed for his next followup appointment. He will continue the medications as listed.

## 2011-07-03 NOTE — Assessment & Plan Note (Signed)
Blood pressure is okay. He'll continue the medicines as listed.

## 2011-07-03 NOTE — Patient Instructions (Addendum)
The current medical regimen is effective;  continue present plan and medications.  Follow up in 6 months with Dr Hochrein.  You will receive a letter in the mail 2 months before you are due.  Please call us when you receive this letter to schedule your follow up appointment.  

## 2011-07-03 NOTE — Progress Notes (Signed)
HPI The patient presents for followup after CABG. It's been some time in rehabilitation after this. Thankfully since surgery he has not been drinking or using cigarettes. He thinks his breathing is better. He does have some chest discomfort but this seems to be an incisional and not like his previous angina. He walks with a walker but this has been a chronic problem. He denies any PND or orthopnea. He denies any palpitations, presyncope or syncope. He's not having any new fevers or chills. He's had no palpitations, presyncope or syncope. He has no weight gain or edema.  Allergies  Allergen Reactions  . Sertraline Hcl     REACTION: tongue bleed    Current Outpatient Prescriptions  Medication Sig Dispense Refill  . AMLODIPINE BESYLATE PO Take 10 mg by mouth daily.      Marland Kitchen aspirin 325 MG tablet Take 325 mg by mouth daily.        . Cholecalciferol (VITAMIN D3) 1000 UNITS CAPS Take 1 capsule by mouth daily.      Marland Kitchen docusate sodium (COLACE) 100 MG capsule Take 100 mg by mouth 2 (two) times daily.      . lansoprazole (PREVACID) 30 MG capsule Take 30 mg by mouth daily.      Marland Kitchen LORazepam (ATIVAN) 0.5 MG tablet TAKE 1 TABLET BY MOUTH TWICE DAILY  60 tablet  0  . metoprolol tartrate (LOPRESSOR) 25 MG tablet Take 1 tablet (25 mg total) by mouth 2 (two) times daily.  30 tablet  1  . pravastatin (PRAVACHOL) 40 MG tablet Take 1 tablet (40 mg total) by mouth daily.  90 tablet  2  . traMADol (ULTRAM) 50 MG tablet Take 50 mg by mouth every 6 (six) hours as needed.        Past Medical History  Diagnosis Date  . Carotid arterial disease     s/p RCEA 11/2008 (Note: hx of negative cardiac nuc 03/2009)  . Hypertension   . Colonic polyp     cscopes 08/18/05 tics,polyps.Marland Kitchenall hyperplastic  . GERD (gastroesophageal reflux disease)     Barrets Esop. (per bx 08/2005) and HH f/u Dr.Schooler  . Anal warts   . Anal fissure   . Hemorrhoids   . 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  Panic attack   . Osteoarthritis   . Depression   . H/O ETOH abuse     Former. Quit ~2012    Past Surgical History  Procedure Date  . Right carotid endartectomy with dacron patch angioplasty 11/2008  . Appendectomy   . Knee surgery   . Foot surgery     fx  . Total hip arthroplasty 2005    R due to OA  . Skin tags excised   . Transurethral resection of prostate   . Cataract extraction     wears contact lens L, good vision  . Coronary artery bypass graft 03/23/2011    Procedure: CORONARY ARTERY BYPASS GRAFTING (CABG);  Surgeon: Loreli Slot, MD;  Location: [facility] OR;  Service: Open Heart Surgery;  Laterality: N/A;  CABG x four using bilateral greater saphenous vein, harvested endoscopically    ROS:  As stated in the HPI and negative for all other systems.  PHYSICAL EXAM BP 123/74  Pulse 67  Ht 6\' 1"  (1.854 m)  Wt 194 lb 12.8 oz (88.361 kg)  BMI 25.70 kg/m2 GENERAL:  Chronically ill appearing but in no acute distress HEENT:  Pupils  equal round and reactive, fundi not visualized, oral mucosa unremarkable NECK:  No jugular venous distention, waveform within normal limits, carotid upstroke brisk and symmetric, no bruits, no thyromegaly, CEA scar LYMPHATICS:  No cervical, inguinal adenopathy LUNGS:  Clear to auscultation bilaterally BACK:  No CVA tenderness CHEST:  Well healed sternotomy scar. HEART:  PMI not displaced or sustained,S1 and S2 within normal limits, no S3, no S4, no clicks, no rubs, no murmurs ABD:  Flat, positive bowel sounds normal in frequency in pitch, no bruits, no rebound, no guarding, no midline pulsatile mass, no hepatomegaly, no splenomegaly EXT:  2 plus pulses throughout, no edema, no cyanosis no clubbing SKIN:  No rashes no nodules NEURO:  Cranial nerves II through XII grossly intact, motor grossly intact throughout, diffuse muscle wasting PSYCH:  Cognitively intact, oriented to person place and time   ASSESSMENT AND PLAN

## 2011-07-10 ENCOUNTER — Other Ambulatory Visit: Payer: Self-pay | Admitting: *Deleted

## 2011-07-10 MED ORDER — METOPROLOL TARTRATE 25 MG PO TABS: 25.0000 mg | ORAL_TABLET | Freq: Two times a day (BID) | ORAL | Status: DC

## 2011-07-10 NOTE — Telephone Encounter (Signed)
Last OV 06-15-11,.Please advise ok to fill

## 2011-07-10 NOTE — Telephone Encounter (Signed)
Rx sent 

## 2011-07-10 NOTE — Telephone Encounter (Signed)
Ok metoprolol x 6 months

## 2011-07-17 ENCOUNTER — Other Ambulatory Visit: Payer: Self-pay | Admitting: Internal Medicine

## 2011-07-17 NOTE — Telephone Encounter (Signed)
Refill done.  

## 2011-07-31 ENCOUNTER — Other Ambulatory Visit: Payer: Self-pay | Admitting: Internal Medicine

## 2011-07-31 NOTE — Telephone Encounter (Signed)
Ok #60, 0 RF  

## 2011-07-31 NOTE — Telephone Encounter (Signed)
Refill done.  

## 2011-07-31 NOTE — Telephone Encounter (Signed)
Ok to refill 

## 2011-09-15 ENCOUNTER — Telehealth: Payer: Self-pay | Admitting: *Deleted

## 2011-09-15 ENCOUNTER — Other Ambulatory Visit: Payer: Self-pay | Admitting: Internal Medicine

## 2011-09-15 NOTE — Telephone Encounter (Signed)
I see in his list both Prevacid and omeprazole. He needs to be taking only one of the 2. Whatever he prefers okay to call  #30 and 3 refills

## 2011-09-15 NOTE — Telephone Encounter (Signed)
Refill done.  

## 2011-09-15 NOTE — Telephone Encounter (Signed)
Please advise not on med list. Pt notes that this was given to him while in ED

## 2011-09-16 NOTE — Telephone Encounter (Signed)
Pt state that he is only taking the omeprazole. Pt aware Rx sent.

## 2011-09-27 ENCOUNTER — Other Ambulatory Visit: Payer: Self-pay | Admitting: Internal Medicine

## 2011-09-28 NOTE — Telephone Encounter (Signed)
done

## 2011-09-28 NOTE — Telephone Encounter (Signed)
Last OV 6.3.13. Last filled 7.19.13. OK to refill?

## 2011-10-16 ENCOUNTER — Encounter: Payer: Self-pay | Admitting: Internal Medicine

## 2011-10-16 ENCOUNTER — Ambulatory Visit (INDEPENDENT_AMBULATORY_CARE_PROVIDER_SITE_OTHER): Payer: Medicare PPO | Admitting: Internal Medicine

## 2011-10-16 VITALS — BP 134/80 | HR 82 | Temp 98.4°F | Wt 207.0 lb

## 2011-10-16 DIAGNOSIS — F17200 Nicotine dependence, unspecified, uncomplicated: Secondary | ICD-10-CM

## 2011-10-16 DIAGNOSIS — R269 Unspecified abnormalities of gait and mobility: Secondary | ICD-10-CM

## 2011-10-16 DIAGNOSIS — I2510 Atherosclerotic heart disease of native coronary artery without angina pectoris: Secondary | ICD-10-CM

## 2011-10-16 DIAGNOSIS — I10 Essential (primary) hypertension: Secondary | ICD-10-CM

## 2011-10-16 DIAGNOSIS — C4491 Basal cell carcinoma of skin, unspecified: Secondary | ICD-10-CM

## 2011-10-16 DIAGNOSIS — F1010 Alcohol abuse, uncomplicated: Secondary | ICD-10-CM

## 2011-10-16 MED ORDER — CLOBETASOL PROPIONATE 0.05 % EX CREA: TOPICAL_CREAM | Freq: Two times a day (BID) | CUTANEOUS | Status: DC

## 2011-10-16 NOTE — Assessment & Plan Note (Signed)
H/o BCC L neck, s/p Moh's , Dr Odelia Gage 2009 Recommend to see dermatology from time to time

## 2011-10-16 NOTE — Assessment & Plan Note (Signed)
Denies smoking at this time.

## 2011-10-16 NOTE — Assessment & Plan Note (Addendum)
Well controlled 

## 2011-10-16 NOTE — Assessment & Plan Note (Signed)
Not drinking at this time

## 2011-10-16 NOTE — Progress Notes (Signed)
  Subjective:    Patient ID: [name], male    DOB: 25-Jul-1940, 71 y.o.   MRN: [id number]  HPI Routine office visit, we discussed the following issues The post surgical CABG scar is itching and thick. Alcohol tobacco, denies drinking or smoking. Fatigue is about the same Gait is about the same, uses a walker most of the time, from time to time uses a wheelchair Heart disease, saw cardiology 06/2011, note reviewed was stable.   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 all hyperplastic GERD, Barretts Esoph. (per Bx 08-2005)  and HH--f/u Dr Bosie Clos H/o Anal warts, anal fissures and hemorrhoids   ------ SKIN Ca :  SQUAMOUS CELL , L side of face; s/p removal aprox 2009 BPH----- f/u w/ Dr Etta Grandchild, s/p procedure 01-30-2008 Hx of PANIC ATTACK   OA Depression  Past Surgical History: CABG  3-13   Right carotid endarterectomy with Dacron patch angioplasty 11-2008 Appendectomy Knee surgery Foot surgery (fx.) Hip Replacement --R-- due to OA  (2005) Skin tags excised    TURP wears a contact lens L, cataract surgery R... good vision  Family History: Father: Lung CA mets to brain (died) Mother: HTN,  arthritis CAD - no HTN - M DM - P. Aunt stroke - PGF colon Ca - no prostate Ca - no  Social History: Retired, divorce, 2 kids, they live in [place], [place] Has a girlfriend, Marchelle Folks , mom lives w/ him,91 y/o bed ridden he is a single child   Quit tobacco 03-2011 + ETOH abuse --reports no ETOH since 03-2011   Drug use-no Regular exercise-no  Review of Systems medication list is reviewed, good compliance. Denies dysphasia or odynophagia. Complained of weight gain , he thinks related to quitting tobacco and being inactive however he can not exercise at all.  Occ CP already d/w cards, no SOB    Objective:   Physical Exam General -- alert, well-developed. No apparent  distress.  Lungs -- normal respiratory effort, no intercostal retractions, no accessory muscle use, and normal breath sounds.   Heart-- normal rate, regular rhythm, no murmur, and no gallop.  Chest wall, well-healed postsurgical scar, the distal part of the scar is thick without redness or warmness  extremities-- no pretibial edema bilaterally  Neurologic-- alert & oriented X3 and strength normal in all extremities. Psych-- Cognition and judgment appear intact. Alert and cooperative with normal attention span and concentration.  not anxious appearing and not depressed appearing.       Assessment & Plan:   Keloid scar, without about possible referral for a injection of Kenalog, patient does not desire to do that at this time. Will prescribe topical steroid.

## 2011-10-16 NOTE — Assessment & Plan Note (Signed)
Seems to be doing well, control continue with amlodipine, beta blockers and cholesterol medication.

## 2011-10-16 NOTE — Assessment & Plan Note (Addendum)
This remains his main challenge, this is going on for years, limited mostly because of back and hip pain. In the past he reportedly saw 3 orthopedic doctors in [place]: "nobody knew what I had" I offered a referral to a local orthopedic surgeon today but he declined.

## 2011-11-24 ENCOUNTER — Other Ambulatory Visit: Payer: Self-pay | Admitting: Internal Medicine

## 2011-11-24 NOTE — Telephone Encounter (Signed)
Ok to refill 

## 2011-11-24 NOTE — Telephone Encounter (Signed)
done

## 2011-12-03 ENCOUNTER — Encounter: Payer: Self-pay | Admitting: Vascular Surgery

## 2011-12-12 ENCOUNTER — Other Ambulatory Visit: Payer: Self-pay | Admitting: Internal Medicine

## 2011-12-31 ENCOUNTER — Encounter: Payer: Self-pay | Admitting: Cardiology

## 2011-12-31 ENCOUNTER — Ambulatory Visit (INDEPENDENT_AMBULATORY_CARE_PROVIDER_SITE_OTHER): Payer: Medicare PPO | Admitting: Cardiology

## 2011-12-31 VITALS — BP 130/85 | HR 67 | Ht 73.0 in | Wt 216.8 lb

## 2011-12-31 DIAGNOSIS — I10 Essential (primary) hypertension: Secondary | ICD-10-CM

## 2011-12-31 DIAGNOSIS — I6529 Occlusion and stenosis of unspecified carotid artery: Secondary | ICD-10-CM

## 2011-12-31 DIAGNOSIS — I2510 Atherosclerotic heart disease of native coronary artery without angina pectoris: Secondary | ICD-10-CM

## 2011-12-31 MED ORDER — ISOSORBIDE MONONITRATE ER 30 MG PO TB24: 30.0000 mg | ORAL_TABLET | Freq: Every day | ORAL | Status: DC

## 2011-12-31 MED ORDER — NITROGLYCERIN 0.4 MG SL SUBL: 0.4000 mg | SUBLINGUAL_TABLET | SUBLINGUAL | Status: DC | PRN

## 2011-12-31 MED ORDER — METOPROLOL TARTRATE 50 MG PO TABS: 50.0000 mg | ORAL_TABLET | Freq: Two times a day (BID) | ORAL | Status: DC

## 2011-12-31 NOTE — Patient Instructions (Addendum)
Please increase your Metoprolol to 50 mg twice a day Start Isosorbide 30 mg daily Use SL Nitroglycerin as needed for chest pain.  Follow up in 4 months with Dr Antoine Poche.  Nitroglycerin sublingual tablets What is this medicine? NITROGLYCERIN (nye troe GLI ser in) is a type of vasodilator. It relaxes blood vessels, increasing the blood and oxygen supply to your heart. This medicine is used to relieve chest pain caused by angina. It is also used to prevent chest pain before activities like climbing stairs, going outdoors in cold weather, or sexual activity. This medicine may be used for other purposes; ask your health care provider or pharmacist if you have questions. What should I tell my health care provider before I take this medicine? They need to know if you have any of these conditions: -anemia -head injury, recent stroke, or bleeding in the brain -liver disease -previous heart attack -an unusual or allergic reaction to nitroglycerin, other medicines, foods, dyes, or preservatives -pregnant or trying to get pregnant -breast-feeding How should I use this medicine? Take this medicine by mouth as needed. At the first sign of an angina attack (chest pain or tightness) place one tablet under your tongue. You can also take this medicine 5 to 10 minutes before an event likely to produce chest pain. Follow the directions on the prescription label. Let the tablet dissolve under the tongue. Do not swallow whole. Replace the dose if you accidentally swallow it. It will help if your mouth is not dry. Saliva around the tablet will help it to dissolve more quickly. Do not eat or drink, smoke or chew tobacco while a tablet is dissolving. If you are not better within 5 minutes after taking ONE dose of nitroglycerin, call 9-1-1 immediately to seek emergency medical care. Do not take more than 3 nitroglycerin tablets over 15 minutes. If you take this medicine often to relieve symptoms of angina, your doctor or  health care professional may provide you with different instructions to manage your symptoms. If symptoms do not go away after following these instructions, it is important to call 9-1-1 immediately. Do not take more than 3 nitroglycerin tablets over 15 minutes. Talk to your pediatrician regarding the use of this medicine in children. Special care may be needed. Overdosage: If you think you have taken too much of this medicine contact a poison control center or emergency room at once. NOTE: This medicine is only for you. Do not share this medicine with others. What if I miss a dose? This does not apply. This medicine is only used as needed. What may interact with this medicine? Do not take this medicine with any of the following medications: -certain migraine medicines like ergotamine and dihydroergotamine (DHE) -medicines used to treat erectile dysfunction like sildenafil, tadalafil, and vardenafil This medicine may also interact with the following medications: -alteplase -aspirin -heparin -medicines for high blood pressure -medicines for mental depression -other medicines used to treat angina -phenothiazines like chlorpromazine, mesoridazine, prochlorperazine, thioridazine This list may not describe all possible interactions. Give your health care provider a list of all the medicines, herbs, non-prescription drugs, or dietary supplements you use. Also tell them if you smoke, drink alcohol, or use illegal drugs. Some items may interact with your medicine. What should I watch for while using this medicine? Tell your doctor or health care professional if you feel your medicine is no longer working. Keep this medicine with you at all times. Sit or lie down when you take your medicine to  prevent falling if you feel dizzy or faint after using it. Try to remain calm. This will help you to feel better faster. If you feel dizzy, take several deep breaths and lie down with your feet propped up, or bend  forward with your head resting between your knees. You may get drowsy or dizzy. Do not drive, use machinery, or do anything that needs mental alertness until you know how this drug affects you. Do not stand or sit up quickly, especially if you are an older patient. This reduces the risk of dizzy or fainting spells. Alcohol can make you more drowsy and dizzy. Avoid alcoholic drinks. Do not treat yourself for coughs, colds, or pain while you are taking this medicine without asking your doctor or health care professional for advice. Some ingredients may increase your blood pressure. What side effects may I notice from receiving this medicine? Side effects that you should report to your doctor or health care professional as soon as possible: -blurred vision -dry mouth -skin rash -sweating -the feeling of extreme pressure in the head -unusually weak or tired Side effects that usually do not require medical attention (report to your doctor or health care professional if they continue or are bothersome): -flushing of the face or neck -headache -irregular heartbeat, palpitations -nausea, vomiting This list may not describe all possible side effects. Call your doctor for medical advice about side effects. You may report side effects to FDA at 1-800-FDA-1088. Where should I keep my medicine? Keep out of the reach of children. Store at room temperature between 20 and 25 degrees C (68 and 77 degrees F). Store in Retail buyer. Protect from light and moisture. Keep tightly closed. Throw away any unused medicine after the expiration date. NOTE: This sheet is a summary. It may not cover all possible information. If you have questions about this medicine, talk to your doctor, pharmacist, or health care provider.  2012, [employer]vier/[employer] Standard. (07/22/2007 5:16:24 PM)

## 2011-12-31 NOTE — Progress Notes (Signed)
HPI The patient presents for followup after CABG. he continues to abstain from cigarettes and alcohol. He unfortunately has never really felt well since he has surgery. He doesn't recall the symptoms at the time of his acute presentation. He does note he's had some chest discomfort since surgery. It happens sporadically. It might happen with activity such as taking a shower. He describes a burning discomfort. He does not describe associated symptoms such as nausea vomiting or diaphoresis. He does not describe palpitations, presyncope or syncope. He is not having PND or orthopnea. He does have some limitation from a chronic leg weakness originating from chronic orthopedic problems.   Allergies  Allergen Reactions  . Sertraline Hcl     REACTION: tongue bleed    Current Outpatient Prescriptions  Medication Sig Dispense Refill  . amLODipine (NORVASC) 10 MG tablet TAKE 1 TABLET BY MOUTH EVERY DAY  90 tablet  1  . aspirin 325 MG tablet Take 325 mg by mouth daily.        . Cholecalciferol (VITAMIN D3) 1000 UNITS CAPS Take 1 capsule by mouth daily.      . clobetasol cream (TEMOVATE) 0.05 % Apply topically 2 (two) times daily.  30 g  0  . docusate sodium (COLACE) 100 MG capsule Take 100 mg by mouth 2 (two) times daily.      Marland Kitchen LORazepam (ATIVAN) 0.5 MG tablet TAKE 1 TABLET BY MOUTH TWICE DAILY  60 tablet  1  . metoprolol tartrate (LOPRESSOR) 25 MG tablet Take 1 tablet (25 mg total) by mouth 2 (two) times daily.  60 tablet  6  . omeprazole (PRILOSEC) 40 MG capsule TAKE 1 CAPSULE BY MOUTH EVERY DAY  90 capsule  1  . pravastatin (PRAVACHOL) 40 MG tablet Take 1 tablet (40 mg total) by mouth daily.  90 tablet  2    Past Medical History  Diagnosis Date  . Carotid arterial disease     s/p RCEA 11/2008 (Note: hx of negative cardiac nuc 03/2009)  . Hypertension   . Colonic polyp     cscopes 08/18/05 tics,polyps.Marland Kitchenall hyperplastic  . GERD (gastroesophageal reflux disease)     Barrets Esop. (per bx 08/2005)  and HH f/u Dr.Schooler  . Anal warts   . Anal fissure   . Hemorrhoids   . 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 Panic attack   . Osteoarthritis   . Depression   . H/O ETOH abuse     Former. Quit ~2012    Past Surgical History  Procedure Date  . Right carotid endartectomy with dacron patch angioplasty 11/2008  . Appendectomy   . Knee surgery   . Foot surgery     fx  . Total hip arthroplasty 2005    R due to OA  . Skin tags excised   . Transurethral resection of prostate   . Cataract extraction     wears contact lens L, good vision  . Coronary artery bypass graft 03/23/2011    Procedure: CORONARY ARTERY BYPASS GRAFTING (CABG);  Surgeon: Loreli Slot, MD;  Location: [facility] OR;  Service: Open Heart Surgery;  Laterality: N/A;  CABG x four using bilateral greater saphenous vein, harvested endoscopically    ROS:  As stated in the HPI and negative for all other systems.  PHYSICAL EXAM BP 130/85  Pulse 67  Ht 6\' 1"  (1.854 m)  Wt 216 lb 12.8 oz (98.34 kg)  BMI 28.60 kg/m2  GENERAL:  Chronically ill appearing but in no acute distress HEENT:  Pupils equal round and reactive, fundi not visualized, oral mucosa unremarkable NECK:  No jugular venous distention, waveform within normal limits, carotid upstroke brisk and symmetric, no bruits, no thyromegaly, CEA scar LYMPHATICS:  No cervical, inguinal adenopathy LUNGS:  Clear to auscultation bilaterally BACK:  No CVA tenderness CHEST:  Well healed sternotomy scar. HEART:  PMI not displaced or sustained,S1 and S2 within normal limits, no S3, no S4, no clicks, no rubs, no murmurs ABD:  Flat, positive bowel sounds normal in frequency in pitch, no bruits, no rebound, no guarding, no midline pulsatile mass, no hepatomegaly, no splenomegaly EXT:  2 plus pulses throughout, no edema, no cyanosis no clubbing SKIN:  No rashes no nodules NEURO:  Cranial nerves II through XII grossly intact, motor grossly intact  throughout, diffuse muscle wasting PSYCH:  Cognitively intact, oriented to person place and time  EKG:  Sinus rhythm, rate 67, axis within normal limits, nonspecific ST-T wave changes, early transition in lead V2, old inferior infarct. 12/31/2011  ASSESSMENT AND PLAN   CAD (coronary artery disease) - He did have small vessel and diffuse disease at the time of his cath before bypass. I am not convinced that his pain is coming from angina but I will try to maximize his medicines. I will increase his metoprolol to 50 mg twice a day. I will add Imdur 30 mg daily. Further med titration will be based on future symptoms.  HYPERTENSION -  Blood pressure is okay. This is being managed in the context of treating his CHF  Hyperlipidemia -  I will give him written instructions were lipid profile with a goal LDL less than 100 and HDL greater than 40.

## 2012-01-17 ENCOUNTER — Other Ambulatory Visit: Payer: Self-pay | Admitting: Internal Medicine

## 2012-01-18 NOTE — Telephone Encounter (Signed)
Refill done.  

## 2012-02-16 ENCOUNTER — Encounter: Payer: Self-pay | Admitting: Lab

## 2012-02-17 ENCOUNTER — Ambulatory Visit (INDEPENDENT_AMBULATORY_CARE_PROVIDER_SITE_OTHER): Payer: Medicare PPO | Admitting: Internal Medicine

## 2012-02-17 ENCOUNTER — Encounter: Payer: Self-pay | Admitting: Internal Medicine

## 2012-02-17 VITALS — BP 130/82 | HR 63 | Temp 98.2°F | Wt 225.0 lb

## 2012-02-17 DIAGNOSIS — E785 Hyperlipidemia, unspecified: Secondary | ICD-10-CM

## 2012-02-17 DIAGNOSIS — F3289 Other specified depressive episodes: Secondary | ICD-10-CM

## 2012-02-17 DIAGNOSIS — Z87898 Personal history of other specified conditions: Secondary | ICD-10-CM

## 2012-02-17 DIAGNOSIS — R972 Elevated prostate specific antigen [PSA]: Secondary | ICD-10-CM

## 2012-02-17 DIAGNOSIS — K2270 Barrett's esophagus without dysplasia: Secondary | ICD-10-CM

## 2012-02-17 DIAGNOSIS — F329 Major depressive disorder, single episode, unspecified: Secondary | ICD-10-CM

## 2012-02-17 DIAGNOSIS — I10 Essential (primary) hypertension: Secondary | ICD-10-CM

## 2012-02-17 DIAGNOSIS — I2510 Atherosclerotic heart disease of native coronary artery without angina pectoris: Secondary | ICD-10-CM

## 2012-02-17 DIAGNOSIS — Z0000 Encounter for general adult medical examination without abnormal findings: Secondary | ICD-10-CM

## 2012-02-17 LAB — COMPREHENSIVE METABOLIC PANEL
BUN: 14 mg/dL (ref 6–23)
CO2: 25 mEq/L (ref 19–32)
Creatinine, Ser: 1.3 mg/dL (ref 0.4–1.5)
GFR: 59.11 mL/min — ABNORMAL LOW (ref >60.00)
Glucose, Bld: 92 mg/dL (ref 70–99)
Total Bilirubin: 0.8 mg/dL (ref 0.3–1.2)

## 2012-02-17 LAB — CBC WITH DIFFERENTIAL/PLATELET
Basophils Relative: 0.3 % (ref 0.0–3.0)
Eosinophils Absolute: 0.2 10*3/uL (ref 0.0–0.7)
Eosinophils Relative: 1.6 % (ref 0.0–5.0)
HCT: 42.6 % (ref 39.0–52.0)
Lymphs Abs: 2.2 10*3/uL (ref 0.7–4.0)
MCHC: 33.7 g/dL (ref 30.0–36.0)
MCV: 93.3 fl (ref 78.0–100.0)
Monocytes Absolute: 0.7 10*3/uL (ref 0.1–1.0)
Neutrophils Relative %: 68.9 % (ref 43.0–77.0)
Platelets: 192 10*3/uL (ref 150.0–400.0)
RBC: 4.57 Mil/uL (ref 4.22–5.81)
WBC: 9.8 10*3/uL (ref 4.5–10.5)

## 2012-02-17 LAB — LIPID PANEL
Cholesterol: 124 mg/dL (ref 0–200)
LDL Cholesterol: 67 mg/dL (ref 0–99)
Triglycerides: 120 mg/dL (ref 0.0–149.0)
VLDL: 24 mg/dL (ref 0.0–40.0)

## 2012-02-17 MED ORDER — ZOSTER VACCINE LIVE 19400 UNT/0.65ML ~~LOC~~ SOLR: 0.6500 mL | Freq: Once | SUBCUTANEOUS | Status: DC

## 2012-02-17 NOTE — Assessment & Plan Note (Addendum)
Td ~ 2009 pneumonia shot-2012 Shingles shot discussed , rx  provided   Cscope  2007--- 3 hyperplastic polyps  Dr Bosie Clos , was due for a colonoscopy 2010 ----> referred to GI counseled about diet and exercise Has quit tobacco and alcohol, praised

## 2012-02-17 NOTE — Assessment & Plan Note (Addendum)
Patient has chronic urinary incontinence apparently since the TURP. No DRE performed today but last year was essentially negative. Last PSA was slightly elevated, check a PSA

## 2012-02-17 NOTE — Assessment & Plan Note (Addendum)
Controlled, no change, labs

## 2012-02-17 NOTE — Assessment & Plan Note (Signed)
Report chest pain on and off, already eval by cardiology, sx felt not to be classic for angina.  Pattern of pain has not changed despite  increasing beta blocker dose and Imdur. Plan: Continue with present care. Will let cardiology know

## 2012-02-17 NOTE — Patient Instructions (Signed)

## 2012-02-17 NOTE — Assessment & Plan Note (Signed)
Due for labs

## 2012-02-17 NOTE — Progress Notes (Signed)
Subjective:    Patient ID: [name], male    DOB: 09-22-1940, 72 y.o.   MRN: [id number]  HPI Medicare Physical  1. Risk factors based on Past M, S, F history:reviewed  2. Physical Activities: not very active, minimal home chores  3. Depression/mood: h/o chronic depression however  today denies problems  4. Hearing: normal, no problems noted w/ normal conversation  5. ADL's: needs help w/ all ADLs, girlfriend helps (they don't live together) 6. Fall Risk:  Had a fall within last year, high risk, see instructions, continue using a cane and a walker  7. Home Safety: does feel safe at home  8. Height, weight, &visual acuity: see VS, wears a contact lens L, cataract surgery R with good results  9. Counseling: done  10. Labs ordered based on risk factors: if needed  11. Referral Coordination, if needed  12. Care Plan, see assessment and plan  13. Cognitive Assessment, motor habilities limited d/t OA, cognition seems intact   in addition we discussed the following CAD, good medication compliance, he continue with chest pain, on and off, the last few seconds, does not last enough to take nitroglycerin. Cardiology increase beta blockers and started him on imdur ---> symptoms are about the same. They have not increased in frequency or intensity. Hypercholesterol, good compliance with medication. Anxiety, well-controlled with Ativan when necessary. Hypertension, good medication compliance, BP check is checked sporadically and it is 130/70.    Past Medical History  Diagnosis Date  . Carotid arterial disease     s/p RCEA 11/2008 (Note: hx of negative cardiac nuc 03/2009)  . Hypertension   . Colonic polyp     cscopes 08/18/05 tics,polyps.Marland Kitchenall hyperplastic  . GERD (gastroesophageal reflux disease)     Barrets Esop. (per bx 08/2005) and HH f/u Dr.Schooler  . Anal warts   . Anal fissure   . Hemorrhoids   . 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 Panic  attack   . Osteoarthritis   . Depression   . H/O ETOH abuse     Former. Quit ~2012   Past Surgical History  Procedure Date  . Right carotid endartectomy with dacron patch angioplasty 11/2008  . Appendectomy   . Knee surgery   . Foot surgery     fx  . Total hip arthroplasty 2005    R due to OA  . Skin tags excised   . Transurethral resection of prostate   . Cataract extraction     wears contact lens L, good vision  . Coronary artery bypass graft 03/23/2011    Procedure: CORONARY ARTERY BYPASS GRAFTING (CABG);  Surgeon: Loreli Slot, MD;  Location: [facility] OR;  Service: Open Heart Surgery;  Laterality: N/A;  CABG x four using bilateral greater saphenous vein, harvested endoscopically    Family History  Problem Relation Age of Onset  . Lung cancer Father     mets to brain- deceased  . Arthritis Mother   . Hypertension Mother   . Coronary artery disease Neg Hx   . Colon cancer Neg Hx   . Prostate cancer Neg Hx   . Diabetes Paternal Aunt   . Stroke Paternal Grandfather    History   Social History  . Marital Status: Married    Spouse Name: N/A    Number of Children: 2  . Years of Education: N/A   Occupational History  .     Social History  Main Topics  . Smoking status: Former Smoker -- 1.0 packs/day for 52 years    Types: Cigarettes    Quit date: 04/02/2011  . Smokeless tobacco: Never Used  . Alcohol Use: No     Comment: H/o ETOH abuse (fifth of sherry daily), currently not drinking  . Drug Use: No  . Sexually Active: No   Other Topics Concern  . Not on file   Social History Narrative   Mother lives with him, 2 children [place], [place]), has a girlfriend , she visits daily----  Diet: trying to eat healthy ---Exercise: limited      Review of Systems Denies shortness of breath or cough. No wheezing. No nausea, vomiting, diarrhea or blood in the stools. No dysuria gross hematuria, he continue with bladder incontinence. Diet has not improved, he spent most  of the day in the wheelchair and walks with a walker.    Objective:   Physical Exam Exam performed with the patient in the wheelchair General -- alert, well-developed, NAD.   Lungs --  decreased breath sounds but clear Heart-- normal rate, regular rhythm, no murmur, and no gallop.   Abdomen--soft, nondistended, good bowel sounds..   Extremities-- no pretibial edema bilaterally  Neurologic-- alert & oriented X3 and strength normal in all extremities. Psych-- Cognition and judgment appear intact. Alert and cooperative with normal attention span and concentration.  not anxious-depress appearing      Assessment & Plan:

## 2012-02-17 NOTE — Assessment & Plan Note (Addendum)
History of Barrett's, currently asymptomatic. Again discuss GI referral today, he agreed to go (Dr Bosie Clos), he plans to discuss cscope as well .

## 2012-02-18 ENCOUNTER — Encounter: Payer: Self-pay | Admitting: Internal Medicine

## 2012-02-18 NOTE — Assessment & Plan Note (Signed)
Reports depression is not an issue at this point

## 2012-02-22 ENCOUNTER — Encounter: Payer: Self-pay | Admitting: *Deleted

## 2012-03-11 ENCOUNTER — Other Ambulatory Visit: Payer: Self-pay | Admitting: Internal Medicine

## 2012-03-11 NOTE — Telephone Encounter (Signed)
Refill done.  

## 2012-03-25 ENCOUNTER — Other Ambulatory Visit: Payer: Self-pay | Admitting: Internal Medicine

## 2012-03-25 NOTE — Telephone Encounter (Signed)
Last filled 11-24-11 #60 1, last OV 02-17-12

## 2012-04-13 ENCOUNTER — Other Ambulatory Visit: Payer: Medicare PPO

## 2012-04-13 ENCOUNTER — Ambulatory Visit: Payer: Medicare PPO | Admitting: Neurosurgery

## 2012-04-14 ENCOUNTER — Other Ambulatory Visit: Payer: Self-pay | Admitting: Internal Medicine

## 2012-04-14 NOTE — Telephone Encounter (Signed)
Refill done.  

## 2012-04-22 ENCOUNTER — Other Ambulatory Visit: Payer: Self-pay | Admitting: Gastroenterology

## 2012-05-02 ENCOUNTER — Encounter: Payer: Self-pay | Admitting: Cardiology

## 2012-05-02 ENCOUNTER — Ambulatory Visit (INDEPENDENT_AMBULATORY_CARE_PROVIDER_SITE_OTHER): Payer: Medicare PPO | Admitting: Cardiology

## 2012-05-02 VITALS — BP 125/74 | HR 62 | Ht 71.0 in | Wt 220.0 lb

## 2012-05-02 DIAGNOSIS — I2510 Atherosclerotic heart disease of native coronary artery without angina pectoris: Secondary | ICD-10-CM

## 2012-05-02 DIAGNOSIS — I10 Essential (primary) hypertension: Secondary | ICD-10-CM

## 2012-05-02 DIAGNOSIS — I6529 Occlusion and stenosis of unspecified carotid artery: Secondary | ICD-10-CM

## 2012-05-02 NOTE — Progress Notes (Signed)
HPI The patient presents for followup after CABG. He continues to abstain from cigarettes and alcohol. At the last visit he was describing some chest discomfort particularly with shower. I added Imdur and increased his beta blocker. Today he doesn't really recall the symptoms. He says he has some fleeting discomfort occasionally. However, he denies any other chest pressure or any of the arm discomfort that was his previous angina. He has no new shortness of breath. He does not describe palpitations, presyncope or syncope. He is not having PND or orthopnea. He does have some limitation from a chronic leg weakness originating from chronic orthopedic problems.   Allergies  Allergen Reactions  . Sertraline Hcl     REACTION: tongue bleed    Current Outpatient Prescriptions  Medication Sig Dispense Refill  . amLODipine (NORVASC) 10 MG tablet TAKE 1 TABLET BY MOUTH EVERY DAY  90 tablet  1  . aspirin 325 MG tablet Take 325 mg by mouth daily.        . Cholecalciferol (VITAMIN D3) 1000 UNITS CAPS Take 1 capsule by mouth daily.      . clobetasol cream (TEMOVATE) 0.05 % Apply topically 2 (two) times daily.  30 g  0  . docusate sodium (COLACE) 100 MG capsule Take 100 mg by mouth 2 (two) times daily.      . isosorbide mononitrate (IMDUR) 30 MG 24 hr tablet Take 1 tablet (30 mg total) by mouth daily.  30 tablet  11  . LORazepam (ATIVAN) 0.5 MG tablet TAKE 1 TABLET BY MOUTH TWICE DAILY  60 tablet  3  . metoprolol tartrate (LOPRESSOR) 50 MG tablet Take 1 tablet (50 mg total) by mouth 2 (two) times daily.  60 tablet  6  . nitroGLYCERIN (NITROSTAT) 0.4 MG SL tablet Place 1 tablet (0.4 mg total) under the tongue every 5 (five) minutes as needed for chest pain.  90 tablet  3  . omeprazole (PRILOSEC) 40 MG capsule TAKE 1 CAPSULE BY MOUTH EVERY DAY  90 capsule  1  . pravastatin (PRAVACHOL) 40 MG tablet TAKE 1 TABLET BY MOUTH EVERY DAY  90 tablet  2  . zoster vaccine live, PF, (ZOSTAVAX) 16109 UNT/0.65ML injection  Inject 19,400 Units into the skin once.  1 each  0   No current facility-administered medications for this visit.    Past Medical History  Diagnosis Date  . Carotid arterial disease     s/p RCEA 11/2008 (Note: hx of negative cardiac nuc 03/2009)  . Hypertension   . Colonic polyp     cscopes 08/18/05 tics,polyps.Marland Kitchenall hyperplastic  . GERD (gastroesophageal reflux disease)     Barrets Esop. (per bx 08/2005) and HH f/u Dr.Schooler  . Anal warts   . Anal fissure   . Hemorrhoids   . 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 Panic attack   . Osteoarthritis   . Depression   . H/O ETOH abuse     Former. Quit ~2012    Past Surgical History  Procedure Laterality Date  . Right carotid endartectomy with dacron patch angioplasty  11/2008  . Appendectomy    . Knee surgery    . Foot surgery      fx  . Total hip arthroplasty  2005    R due to OA  . Skin tags excised    . Transurethral resection of prostate    . Cataract extraction      wears contact  lens L, good vision  . Coronary artery bypass graft  03/23/2011    Procedure: CORONARY ARTERY BYPASS GRAFTING (CABG);  Surgeon: Loreli Slot, MD;  Location: [facility] OR;  Service: Open Heart Surgery;  Laterality: N/A;  CABG x four using bilateral greater saphenous vein, harvested endoscopically    ROS:  As stated in the HPI and negative for all other systems.  PHYSICAL EXAM BP 125/74  Pulse 62  Ht 5\' 11"  (1.803 m)  Wt 220 lb (99.791 kg)  BMI 30.7 kg/m2 GENERAL:  Chronically ill appearing but in no acute distress NECK:  No jugular venous distention, waveform within normal limits, carotid upstroke brisk and symmetric, no bruits, no thyromegaly, CEA scar LUNGS:  Clear to auscultation bilaterally BACK:  No CVA tenderness CHEST:  Well healed sternotomy scar. HEART:  PMI not displaced or sustained,S1 and S2 within normal limits, no S3, no S4, no clicks, no rubs, no murmurs ABD:  Flat, positive bowel sounds  normal in frequency in pitch, no bruits, no rebound, no guarding, no midline pulsatile mass, no hepatomegaly, no splenomegaly EXT:  2 plus pulses throughout, no edema, no cyanosis no clubbing NEURO:  Cranial nerves II through XII grossly intact, motor grossly intact throughout, diffuse muscle wasting   EKG:  Sinus rhythm, rate 59, axis within normal limits, nonspecific ST-T wave changes, early transition in lead V2, old inferior infarct. 05/02/2012  ASSESSMENT AND PLAN   CAD (coronary artery disease) - He has no new symptoms.he is tolerating the meds as listed. No change in therapy is indicated.  HYPERTENSION -  Blood pressure is okay. This is being managed in the context of treating his CHF  Hyperlipidemia -  His LDL was a yellow 33. He will continue the meds as listed.

## 2012-05-31 ENCOUNTER — Other Ambulatory Visit: Payer: Self-pay | Admitting: *Deleted

## 2012-05-31 ENCOUNTER — Encounter: Payer: Self-pay | Admitting: Vascular Surgery

## 2012-05-31 DIAGNOSIS — Z48812 Encounter for surgical aftercare following surgery on the circulatory system: Secondary | ICD-10-CM

## 2012-05-31 DIAGNOSIS — Z48816 Encounter for surgical aftercare following surgery on the genitourinary system: Secondary | ICD-10-CM

## 2012-06-01 ENCOUNTER — Encounter: Payer: Self-pay | Admitting: Vascular Surgery

## 2012-06-01 ENCOUNTER — Other Ambulatory Visit: Payer: Medicare PPO

## 2012-06-01 ENCOUNTER — Other Ambulatory Visit: Payer: Self-pay | Admitting: *Deleted

## 2012-06-01 ENCOUNTER — Ambulatory Visit: Payer: Medicare PPO | Admitting: Vascular Surgery

## 2012-06-01 ENCOUNTER — Other Ambulatory Visit (INDEPENDENT_AMBULATORY_CARE_PROVIDER_SITE_OTHER): Payer: Medicare PPO | Admitting: *Deleted

## 2012-06-01 ENCOUNTER — Ambulatory Visit (INDEPENDENT_AMBULATORY_CARE_PROVIDER_SITE_OTHER): Payer: Medicare PPO | Admitting: Vascular Surgery

## 2012-06-01 DIAGNOSIS — I6529 Occlusion and stenosis of unspecified carotid artery: Secondary | ICD-10-CM

## 2012-06-01 DIAGNOSIS — Z48812 Encounter for surgical aftercare following surgery on the circulatory system: Secondary | ICD-10-CM

## 2012-06-01 NOTE — Progress Notes (Signed)
[facility] [place]  Patient name: [name] MRN: [id number] DOB: 1940-08-29 Sex: male  REASON FOR VISIT: follow up of carotid disease.  HPI: [name] is a 72 y.o. male who underwent a right carotid endarterectomy in 2010. He comes in for a yearly follow up visit. He denies any history of stroke, TIAs, expressive or receptive aphasia, or amaurosis fugax.  He has a history of hypertension and height or cholesterolemia both of which are well-controlled on his current medications.  Past Medical History  Diagnosis Date  . Carotid arterial disease     s/p RCEA 11/2008 (Note: hx of negative cardiac nuc 03/2009)  . Hypertension   . Colonic polyp     cscopes 08/18/05 tics,polyps.Marland Kitchenall hyperplastic  . GERD (gastroesophageal reflux disease)     Barrets Esop. (per bx 08/2005) and HH f/u Dr.Schooler  . Anal warts   . Anal fissure   . Hemorrhoids   . 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 Panic attack   . Osteoarthritis   . Depression   . H/O ETOH abuse     Former. Quit ~2012   Family History  Problem Relation Age of Onset  . Lung cancer Father     mets to brain- deceased  . Arthritis Mother   . Hypertension Mother   . Coronary artery disease Neg Hx   . Colon cancer Neg Hx   . Prostate cancer Neg Hx   . Diabetes Paternal Aunt   . Stroke Paternal Grandfather    SOCIAL HISTORY: History  Substance Use Topics  . Smoking status: Former Smoker -- 1.00 packs/day for 52 years    Types: Cigarettes    Quit date: 04/02/2011  . Smokeless tobacco: Never Used  . Alcohol Use: No     Comment: H/o ETOH abuse (fifth of sherry daily), currently not drinking   Allergies  Allergen Reactions  . Sertraline Hcl     REACTION: tongue bleed   Current Outpatient Prescriptions  Medication Sig Dispense Refill  . amLODipine (NORVASC) 10 MG tablet TAKE 1 TABLET BY MOUTH EVERY DAY  90 tablet  1  . aspirin 325 MG tablet Take 325 mg by  mouth daily.        . Cholecalciferol (VITAMIN D3) 1000 UNITS CAPS Take 1 capsule by mouth daily.      Marland Kitchen docusate sodium (COLACE) 100 MG capsule Take 100 mg by mouth 2 (two) times daily.      . isosorbide mononitrate (IMDUR) 30 MG 24 hr tablet Take 1 tablet (30 mg total) by mouth daily.  30 tablet  11  . LORazepam (ATIVAN) 0.5 MG tablet TAKE 1 TABLET BY MOUTH TWICE DAILY  60 tablet  3  . metoprolol tartrate (LOPRESSOR) 50 MG tablet Take 1 tablet (50 mg total) by mouth 2 (two) times daily.  60 tablet  6  . pravastatin (PRAVACHOL) 40 MG tablet TAKE 1 TABLET BY MOUTH EVERY DAY  90 tablet  2  . clobetasol cream (TEMOVATE) 0.05 % Apply topically 2 (two) times daily.  30 g  0  . nitroGLYCERIN (NITROSTAT) 0.4 MG SL tablet Place 1 tablet (0.4 mg total) under the tongue every 5 (five) minutes as needed for chest pain.  90 tablet  3  . omeprazole (PRILOSEC) 40 MG capsule TAKE 1 CAPSULE BY MOUTH EVERY DAY  90 capsule  1  . zoster vaccine live, PF, (ZOSTAVAX) 40981 UNT/0.65ML injection Inject  19,400 Units into the skin once.  1 each  0   No current facility-administered medications for this visit.   REVIEW OF SYSTEMS: Arly.Keller ] denotes positive finding; [  ] denotes negative finding  CARDIOVASCULAR:  Arly.Keller ] chest pain stable   [ ]  chest pressure   [ ]  palpitations   [ ]  orthopnea   [ ]  dyspnea on exertion   [ ]  claudication   [ ]  rest pain   [ ]  DVT   [ ]  phlebitis PULMONARY:   [ ]  productive cough   [ ]  asthma   [ ]  wheezing NEUROLOGIC:   [ ]  weakness  [ ]  paresthesias  [ ]  aphasia  [ ]  amaurosis  [ ]  dizziness HEMATOLOGIC:   [ ]  bleeding problems   [ ]  clotting disorders MUSCULOSKELETAL:  [ ]  joint pain   [ ]  joint swelling [ ]  leg swelling GASTROINTESTINAL: [ ]   blood in stool  [ ]   hematemesis GENITOURINARY:  [ ]   dysuria  [ ]   hematuria PSYCHIATRIC:  [ ]  history of major depression INTEGUMENTARY:  [ ]  rashes  [ ]  ulcers CONSTITUTIONAL:  [ ]  fever   [ ]  chills  PHYSICAL EXAM: Filed Vitals:   06/01/12  1408 06/01/12 1410  BP: 99/60 110/55  Pulse: 63   Height: 5\' 11"  (1.803 m)   Weight: 220 lb 14.4 oz (100.2 kg)   SpO2: 100%    Body mass index is 30.82 kg/(m^2). GENERAL: The patient is a well-nourished male, in no acute distress. The vital signs are documented above. CARDIOVASCULAR: There is a regular rate and rhythm. I do not detect carotid bruits. PULMONARY: There is good air exchange bilaterally without wheezing or rales. ABDOMEN: Soft and non-tender with normal pitched bowel sounds.  MUSCULOSKELETAL: There are no major deformities or cyanosis. NEUROLOGIC: No focal weakness or paresthesias are detected. SKIN: There are no ulcers or rashes noted. PSYCHIATRIC: The patient has a normal affect.  DATA:  I have independently interpreted his carotid duplex scan which shows a widely patent right carotid endarterectomy site with some mild intimal hyperplasia at the bifurcation. He has no significant left carotid stenosis. Both vertebral arteries are patent with normally directed flow.  MEDICAL ISSUES:  CAROTID ARTERY DISEASE His right carotid endarterectomy site is widely patent without evidence of restenosis. There is no significant stenosis on the left. Both vertebral arteries are patent with antegrade flow. I've ordered a follow carotid duplex scan in 1 year. I'll plan on seeing him back in 2 years unless there is a change in his duplex at one year follow up. He knows to call sooner if he has problems. In the meantime he knows to continue taking his aspirin. He also is on Pravachol for cholesterol which is followed closely by his primary care physician Dr. Drue Novel.   Umi Mainor S [facility] [place] Beeper: [phone]

## 2012-06-01 NOTE — Assessment & Plan Note (Signed)
His right carotid endarterectomy site is widely patent without evidence of restenosis. There is no significant stenosis on the left. Both vertebral arteries are patent with antegrade flow. I've ordered a follow carotid duplex scan in 1 year. I'll plan on seeing him back in 2 years unless there is a change in his duplex at one year follow up. He knows to call sooner if he has problems. In the meantime he knows to continue taking his aspirin. He also is on Pravachol for cholesterol which is followed closely by his primary care physician Dr. Drue Novel.

## 2012-06-02 ENCOUNTER — Other Ambulatory Visit: Payer: Self-pay | Admitting: *Deleted

## 2012-06-02 DIAGNOSIS — Z48812 Encounter for surgical aftercare following surgery on the circulatory system: Secondary | ICD-10-CM

## 2012-06-07 ENCOUNTER — Other Ambulatory Visit: Payer: Self-pay | Admitting: Internal Medicine

## 2012-06-07 NOTE — Telephone Encounter (Signed)
Refill done.  

## 2012-07-26 ENCOUNTER — Other Ambulatory Visit: Payer: Self-pay

## 2012-07-26 MED ORDER — METOPROLOL TARTRATE 50 MG PO TABS: 50.0000 mg | ORAL_TABLET | Freq: Two times a day (BID) | ORAL | Status: DC

## 2012-08-19 ENCOUNTER — Encounter: Payer: Self-pay | Admitting: *Deleted

## 2012-08-19 ENCOUNTER — Ambulatory Visit (INDEPENDENT_AMBULATORY_CARE_PROVIDER_SITE_OTHER): Payer: Medicare PPO | Admitting: Internal Medicine

## 2012-08-19 VITALS — BP 100/70 | HR 67 | Temp 98.5°F

## 2012-08-19 DIAGNOSIS — Z0000 Encounter for general adult medical examination without abnormal findings: Secondary | ICD-10-CM

## 2012-08-19 DIAGNOSIS — F329 Major depressive disorder, single episode, unspecified: Secondary | ICD-10-CM

## 2012-08-19 DIAGNOSIS — R739 Hyperglycemia, unspecified: Secondary | ICD-10-CM | POA: Insufficient documentation

## 2012-08-19 DIAGNOSIS — K2270 Barrett's esophagus without dysplasia: Secondary | ICD-10-CM

## 2012-08-19 DIAGNOSIS — R7309 Other abnormal glucose: Secondary | ICD-10-CM

## 2012-08-19 DIAGNOSIS — Z87898 Personal history of other specified conditions: Secondary | ICD-10-CM

## 2012-08-19 DIAGNOSIS — I2510 Atherosclerotic heart disease of native coronary artery without angina pectoris: Secondary | ICD-10-CM

## 2012-08-19 DIAGNOSIS — I10 Essential (primary) hypertension: Secondary | ICD-10-CM

## 2012-08-19 NOTE — Assessment & Plan Note (Signed)
Due for another ultrasound Ultrasound 05/2013.

## 2012-08-19 NOTE — Progress Notes (Signed)
  Subjective:    Patient ID: [name], male    DOB: 30-May-1940, 72 y.o.   MRN: [id number]  HPI Routine office visit, here with his girlfriend. H/o  Barrett's, chart reviewed, status post a EGD. Heart disease--doing well, saw cardiology 04/2012, felt to be stable. Has not needed nitroglycerin. Carotid artery disease--saw vascular surgery 05/2012, note reviewed, he was doing well. Depression, not an issue, takes Ativan rarely, once a day at most. Hypertension, BP low today, he does not feel any different than other days, ambulatory BPs reportedly better around 115, 120.   Past Surgical History  Procedure Laterality Date  . Right carotid endartectomy with dacron patch angioplasty  11/2008  . Appendectomy    . Knee surgery    . Foot surgery      fx  . Total hip arthroplasty  2005    R due to OA  . Skin tags excised    . Transurethral resection of prostate    . Cataract extraction      wears contact lens L, good vision  . Coronary artery bypass graft  03/23/2011    Procedure: CORONARY ARTERY BYPASS GRAFTING (CABG);  Surgeon: Loreli Slot, MD;  Location: [facility] OR;  Service: Open Heart Surgery;  Laterality: N/A;  CABG x four using bilateral greater saphenous vein, harvested endoscopically    History   Social History  . Marital Status: Married    Spouse Name: N/A    Number of Children: 2  . Years of Education: N/A   Occupational History  .     Social History Main Topics  . Smoking status: Former Smoker -- 1.00 packs/day for 52 years    Types: Cigarettes    Quit date: 04/02/2011  . Smokeless tobacco: Never Used  . Alcohol Use: No     Comment: H/o ETOH abuse (fifth of sherry daily), currently not drinking  . Drug Use: No  . Sexually Active: No   Other Topics Concern  . Not on file   Social History Narrative   Mother lives with him, 2 children [place], [place]), has a girlfriend , she visits daily----     Diet: trying to eat healthy ---   Exercise: limited      Review of Systems Denies chest pain or SOB No nausea, vomiting, diarrhea or blood in the stools. Not drinking or smoking    Objective:   Physical Exam BP 100/70  Pulse 67  Temp(Src) 98.5 F (36.9 C) (Oral)  SpO2 97% General -- alert, well-developed, sitting on the wheelchair in no distress  Lungs -- normal respiratory effort, no intercostal retractions, no accessory muscle use, and normal breath sounds.   Heart-- normal rate, regular rhythm, no murmur, and no gallop.   Extremities-- no pretibial edema bilaterally Neurologic-- alert & oriented X3; Memory normal, speech clear and fluent.Marland Kitchen Psych-- Cognition and judgment appear intact. Alert and cooperative with normal attention span and concentration.  not anxious appearing and not depressed appearing.       Assessment & Plan:

## 2012-08-19 NOTE — Assessment & Plan Note (Signed)
BP in the low side today, reports that at home SBP is 115, 120 usually. He is feeling at baseline. Plan: Continue monitoring BP.

## 2012-08-19 NOTE — Assessment & Plan Note (Signed)
Last PSA 3.7. Will continue monitoring periodically

## 2012-08-19 NOTE — Assessment & Plan Note (Signed)
Check A1c. 

## 2012-08-19 NOTE — Assessment & Plan Note (Signed)
At this point depression is not an issue, on Lorazepam, recommend to take as needed. UDS needed

## 2012-08-19 NOTE — Assessment & Plan Note (Signed)
Had a EGD 04-2002 at [facility], next EGD per GI, continue with PPIs

## 2012-08-19 NOTE — Assessment & Plan Note (Signed)
Had a colonoscopy 04-2012, next per GI

## 2012-08-19 NOTE — Patient Instructions (Signed)
Check the  blood pressure 2 or 3 times a week, be sure it is between 110/60 and 140/85. If it is consistently higher or lower, let me know Next visit in 6 months, fasting Take lorazepam as needed only

## 2012-08-19 NOTE — Progress Notes (Signed)
Pt had a fit why i was trying to draw blood on him . He had me stop and did not want lab work done.        Norm Parcel,

## 2012-08-19 NOTE — Assessment & Plan Note (Signed)
Asymptomatic, seems to be doing well, was taking aspirin 325,  now on 81 mg daily; States one of his other doctors recommend him to do that.

## 2012-08-21 ENCOUNTER — Telehealth: Payer: Self-pay | Admitting: Internal Medicine

## 2012-08-21 NOTE — Telephone Encounter (Signed)
We were unable to get labs last week, ask pt to re schedule labs for this week at his convenience

## 2012-08-22 NOTE — Telephone Encounter (Signed)
Pt states he cannot come this week. Pt scheduled for next Tuesday, 08/30/12.

## 2012-08-30 ENCOUNTER — Other Ambulatory Visit (INDEPENDENT_AMBULATORY_CARE_PROVIDER_SITE_OTHER): Payer: Medicare PPO

## 2012-08-30 DIAGNOSIS — R7309 Other abnormal glucose: Secondary | ICD-10-CM

## 2012-08-31 LAB — HEMOGLOBIN A1C: Hgb A1c MFr Bld: 5.8 % (ref 4.6–6.5)

## 2012-08-31 LAB — BASIC METABOLIC PANEL
CO2: 23 mEq/L (ref 19–32)
Calcium: 8.9 mg/dL (ref 8.4–10.5)
Chloride: 111 mEq/L (ref 96–112)
Glucose, Bld: 88 mg/dL (ref 70–99)
Potassium: 4.2 mEq/L (ref 3.5–5.1)
Sodium: 140 mEq/L (ref 135–145)

## 2012-09-26 ENCOUNTER — Encounter: Payer: Self-pay | Admitting: Internal Medicine

## 2012-10-01 ENCOUNTER — Other Ambulatory Visit: Payer: Self-pay | Admitting: Internal Medicine

## 2012-10-03 NOTE — Telephone Encounter (Signed)
rx request- Ativan  Last OV- 08/19/12  Future OV- 02/20/13 UDS on file LOW risk- 08/19/12  Please advise. DJR

## 2012-10-04 ENCOUNTER — Other Ambulatory Visit: Payer: Self-pay | Admitting: General Practice

## 2012-10-04 NOTE — Telephone Encounter (Signed)
Lorazepam refill request.  Last OV 08-19-12 Med filled 3-14 #60 with 3 refills.   Low Risk

## 2012-10-06 ENCOUNTER — Other Ambulatory Visit: Payer: Self-pay | Admitting: General Practice

## 2012-10-06 NOTE — Telephone Encounter (Signed)
Lorazepam refill Last OV 08-19-12 Med filled 3-14 #60 with 3 refills  Low Risk

## 2012-10-07 ENCOUNTER — Telehealth: Payer: Self-pay | Admitting: General Practice

## 2012-10-07 MED ORDER — LORAZEPAM 0.5 MG PO TABS: 0.5000 mg | ORAL_TABLET | Freq: Two times a day (BID) | ORAL | Status: DC | PRN

## 2012-10-07 MED ORDER — LORAZEPAM 0.5 MG PO TABS
0.5000 mg | ORAL_TABLET | Freq: Two times a day (BID) | ORAL | Status: DC | PRN
Start: 2012-10-07 — End: 2012-10-07

## 2012-10-07 NOTE — Telephone Encounter (Signed)
Lorazepam refill Last OV 08-19-12 Med filled 03-25-12   Low Risk

## 2012-10-07 NOTE — Telephone Encounter (Signed)
Ok to Rf, see Rx

## 2012-10-07 NOTE — Telephone Encounter (Signed)
Med filled.  

## 2012-10-17 ENCOUNTER — Other Ambulatory Visit: Payer: Self-pay | Admitting: Internal Medicine

## 2012-10-17 NOTE — Telephone Encounter (Signed)
-  amlodipine refill sent to pharmacy

## 2012-10-18 ENCOUNTER — Telehealth: Payer: Self-pay | Admitting: *Deleted

## 2012-10-18 NOTE — Telephone Encounter (Signed)
Patient called to ask if he has received a flu shot this year. Patient was informed that he has not received a flu shot in our office this year.

## 2012-12-01 ENCOUNTER — Other Ambulatory Visit: Payer: Self-pay | Admitting: Internal Medicine

## 2012-12-01 NOTE — Telephone Encounter (Signed)
Pravastatin refilled per protocol 

## 2012-12-10 ENCOUNTER — Other Ambulatory Visit: Payer: Self-pay | Admitting: Internal Medicine

## 2012-12-12 ENCOUNTER — Other Ambulatory Visit: Payer: Self-pay | Admitting: Internal Medicine

## 2012-12-12 NOTE — Telephone Encounter (Signed)
Omeprazole refilled per protocol 

## 2012-12-23 ENCOUNTER — Other Ambulatory Visit: Payer: Self-pay

## 2012-12-23 MED ORDER — ISOSORBIDE MONONITRATE ER 30 MG PO TB24: 30.0000 mg | ORAL_TABLET | Freq: Every day | ORAL | Status: DC

## 2012-12-27 ENCOUNTER — Telehealth: Payer: Self-pay | Admitting: *Deleted

## 2012-12-27 ENCOUNTER — Other Ambulatory Visit: Payer: Self-pay | Admitting: Internal Medicine

## 2012-12-27 MED ORDER — LORAZEPAM 0.5 MG PO TABS: 0.5000 mg | ORAL_TABLET | Freq: Two times a day (BID) | ORAL | Status: DC | PRN

## 2012-12-27 NOTE — Telephone Encounter (Signed)
Done

## 2012-12-27 NOTE — Telephone Encounter (Signed)
rx refill- Ativan 0.5mg  Last OV- 08/19/12 Last refilled- 10/07/12 #60 / 1 rf  UDS low- 08/19/12

## 2012-12-27 NOTE — Addendum Note (Signed)
Addended by: Willow Ora E on: 12/27/2012 04:26 PM   Modules accepted: Orders

## 2013-02-20 ENCOUNTER — Encounter: Payer: Self-pay | Admitting: Internal Medicine

## 2013-02-20 ENCOUNTER — Ambulatory Visit (INDEPENDENT_AMBULATORY_CARE_PROVIDER_SITE_OTHER): Payer: Medicare PPO | Admitting: Internal Medicine

## 2013-02-20 VITALS — BP 107/66 | HR 84 | Temp 97.9°F | Wt 213.0 lb

## 2013-02-20 DIAGNOSIS — R066 Hiccough: Secondary | ICD-10-CM

## 2013-02-20 DIAGNOSIS — E785 Hyperlipidemia, unspecified: Secondary | ICD-10-CM

## 2013-02-20 DIAGNOSIS — I2510 Atherosclerotic heart disease of native coronary artery without angina pectoris: Secondary | ICD-10-CM

## 2013-02-20 DIAGNOSIS — F17200 Nicotine dependence, unspecified, uncomplicated: Secondary | ICD-10-CM

## 2013-02-20 DIAGNOSIS — Z0000 Encounter for general adult medical examination without abnormal findings: Secondary | ICD-10-CM

## 2013-02-20 DIAGNOSIS — I10 Essential (primary) hypertension: Secondary | ICD-10-CM

## 2013-02-20 NOTE — Assessment & Plan Note (Signed)
Seems to be doing well, asymptomatic

## 2013-02-20 NOTE — Patient Instructions (Signed)
Get your blood work before you leave   Please get your x-ray at the other [facility]  office located at: [place], across from [facility].  Please go to the basement, this is a walk-in facility, they are open from 8:30 to 5:30 PM. Phone number [phone].  Next visit is for a physical exam in 6 months   No need to come back fasting Please make an appointment

## 2013-02-20 NOTE — Assessment & Plan Note (Signed)
repports he had a Zostavax and a  flu shot already

## 2013-02-20 NOTE — Assessment & Plan Note (Signed)
Heavy smoker, presents w/  hiccups as one of his complaints, for completeness we'll do a chest x-ray

## 2013-02-20 NOTE — Assessment & Plan Note (Signed)
Due for labs, good compliance w/ meds , no apparent problems.

## 2013-02-20 NOTE — Progress Notes (Signed)
Subjective:    Patient ID: [name], male    DOB: November 07, 1940, 73 y.o.   MRN: [id number]  DOS:  02/20/2013 ROV ,we discussed the following issues  Hypertension--good medication compliance, ambulatory BPs around 110/70. CAD--denies chest pain or the need to take nitroglycerin Takes lorazepam as needed, No apparent side effects. Also, 2 months history of hiccups, they have been daily, 3-4 hiccups per episode, that is uncommon for him. Also, still has problems with tobacco, see assessment and plan  Past Medical History  Diagnosis Date  . Carotid arterial disease     s/p RCEA 11/2008 (Note: hx of negative cardiac nuc 03/2009)  . Hypertension   . Colonic polyp     cscopes 08/18/05 tics,polyps.Marland Kitchenall hyperplastic  . GERD (gastroesophageal reflux disease)     Barrets Esop. (per bx 08/2005) and HH f/u Dr.Schooler  . Anal warts   . Anal fissure   . Hemorrhoids   . 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 Panic attack   . Osteoarthritis   . Depression   . H/O ETOH abuse     Former. Quit ~2012    Past Surgical History  Procedure Laterality Date  . Right carotid endartectomy with dacron patch angioplasty  11/2008  . Appendectomy    . Knee surgery    . Foot surgery      fx  . Total hip arthroplasty  2005    R due to OA  . Skin tags excised    . Transurethral resection of prostate    . Cataract extraction      wears contact lens L, good vision  . Coronary artery bypass graft  03/23/2011    Procedure: CORONARY ARTERY BYPASS GRAFTING (CABG);  Surgeon: Melrose Nakayama, MD;  Location: [facility];  Service: Open Heart Surgery;  Laterality: N/A;  CABG x four using bilateral greater saphenous vein, harvested endoscopically    History   Social History  . Marital Status: Married    Spouse Name: N/A    Number of Children: 2  . Years of Education: N/A   Occupational History  .     Social History Main Topics  . Smoking status: Current Some Day  Smoker -- 1.00 packs/day for 52 years    Types: Cigarettes    Last Attempt to Quit: 04/02/2011  . Smokeless tobacco: Never Used  . Alcohol Use: No     Comment: H/o ETOH abuse (fifth of sherry daily), currently not drinking  . Drug Use: No  . Sexual Activity: No   Other Topics Concern  . Not on file   Social History Narrative   Mother lives with him, 2 children [place], [place]), has a girlfriend , she visits daily          ROS Diet-- poor, eats out a lot Exercise-- nothing regular, inactive d/t back-hip pain No  CP, SOB, lower extremity edema No wt loss, minimal cough, no hemoptysis  Denies  nausea, vomiting diarrhea Denies  blood in the stools No dysphagia or odynophagia       Objective:   Physical Exam BP 107/66  Pulse 84  Temp(Src) 97.9 F (36.6 C)  Wt 213 lb (96.616 kg)  SpO2 97% General -- alert, well-developed.  Neck --no mass Lungs -- normal respiratory effort, no intercostal retractions, no accessory muscle use, and  decreased breath sounds.  Heart-- normal rate, regular rhythm, no murmur.  Extremities-- no pretibial edema bilaterally  Neurologic--  alert & oriented X3. Gait limited but at baseline Psych--  No anxious or depressed appearing.     Assessment & Plan:

## 2013-02-20 NOTE — Assessment & Plan Note (Signed)
Well-controlled, ambulatory BPs in the 120s, last BMP okay, no change

## 2013-02-20 NOTE — Progress Notes (Signed)
Pre visit review using our clinic review tool, if applicable. No additional management support is needed unless otherwise documented below in the visit note. 

## 2013-02-20 NOTE — Assessment & Plan Note (Addendum)
Had a relapse recently, states now is tobacco free again  but  has severe cravings. We talk about nicotine supplementation as needed and not to be expose to smokers.

## 2013-02-21 ENCOUNTER — Telehealth: Payer: Self-pay | Admitting: Internal Medicine

## 2013-02-21 LAB — AST: AST: 21 U/L (ref 0–37)

## 2013-02-21 LAB — LIPID PANEL
Cholesterol: 120 mg/dL (ref 0–200)
HDL: 37.1 mg/dL — AB (ref >39.00)
LDL CALC: 66 mg/dL (ref 0–99)
Total CHOL/HDL Ratio: 3
Triglycerides: 87 mg/dL (ref 0.0–149.0)
VLDL: 17.4 mg/dL (ref 0.0–40.0)

## 2013-02-21 LAB — ALT: ALT: 17 U/L (ref 0–53)

## 2013-02-21 NOTE — Telephone Encounter (Signed)
Relevant patient education mailed to patient.  

## 2013-02-21 NOTE — Telephone Encounter (Signed)
Relevant patient education assigned to patient using Emmi. ° °

## 2013-02-23 ENCOUNTER — Encounter: Payer: Self-pay | Admitting: *Deleted

## 2013-02-28 ENCOUNTER — Other Ambulatory Visit: Payer: Self-pay | Admitting: Vascular Surgery

## 2013-02-28 DIAGNOSIS — I6529 Occlusion and stenosis of unspecified carotid artery: Secondary | ICD-10-CM

## 2013-02-28 DIAGNOSIS — Z48812 Encounter for surgical aftercare following surgery on the circulatory system: Secondary | ICD-10-CM

## 2013-03-01 IMAGING — CR DG CHEST 1V PORT
1 series · 1 of 1 positions shown · non-contrast
Comparison: 03/18/2011

CLINICAL DATA: Postop

PORTABLE CHEST - 1 VIEW

[view not recorded]
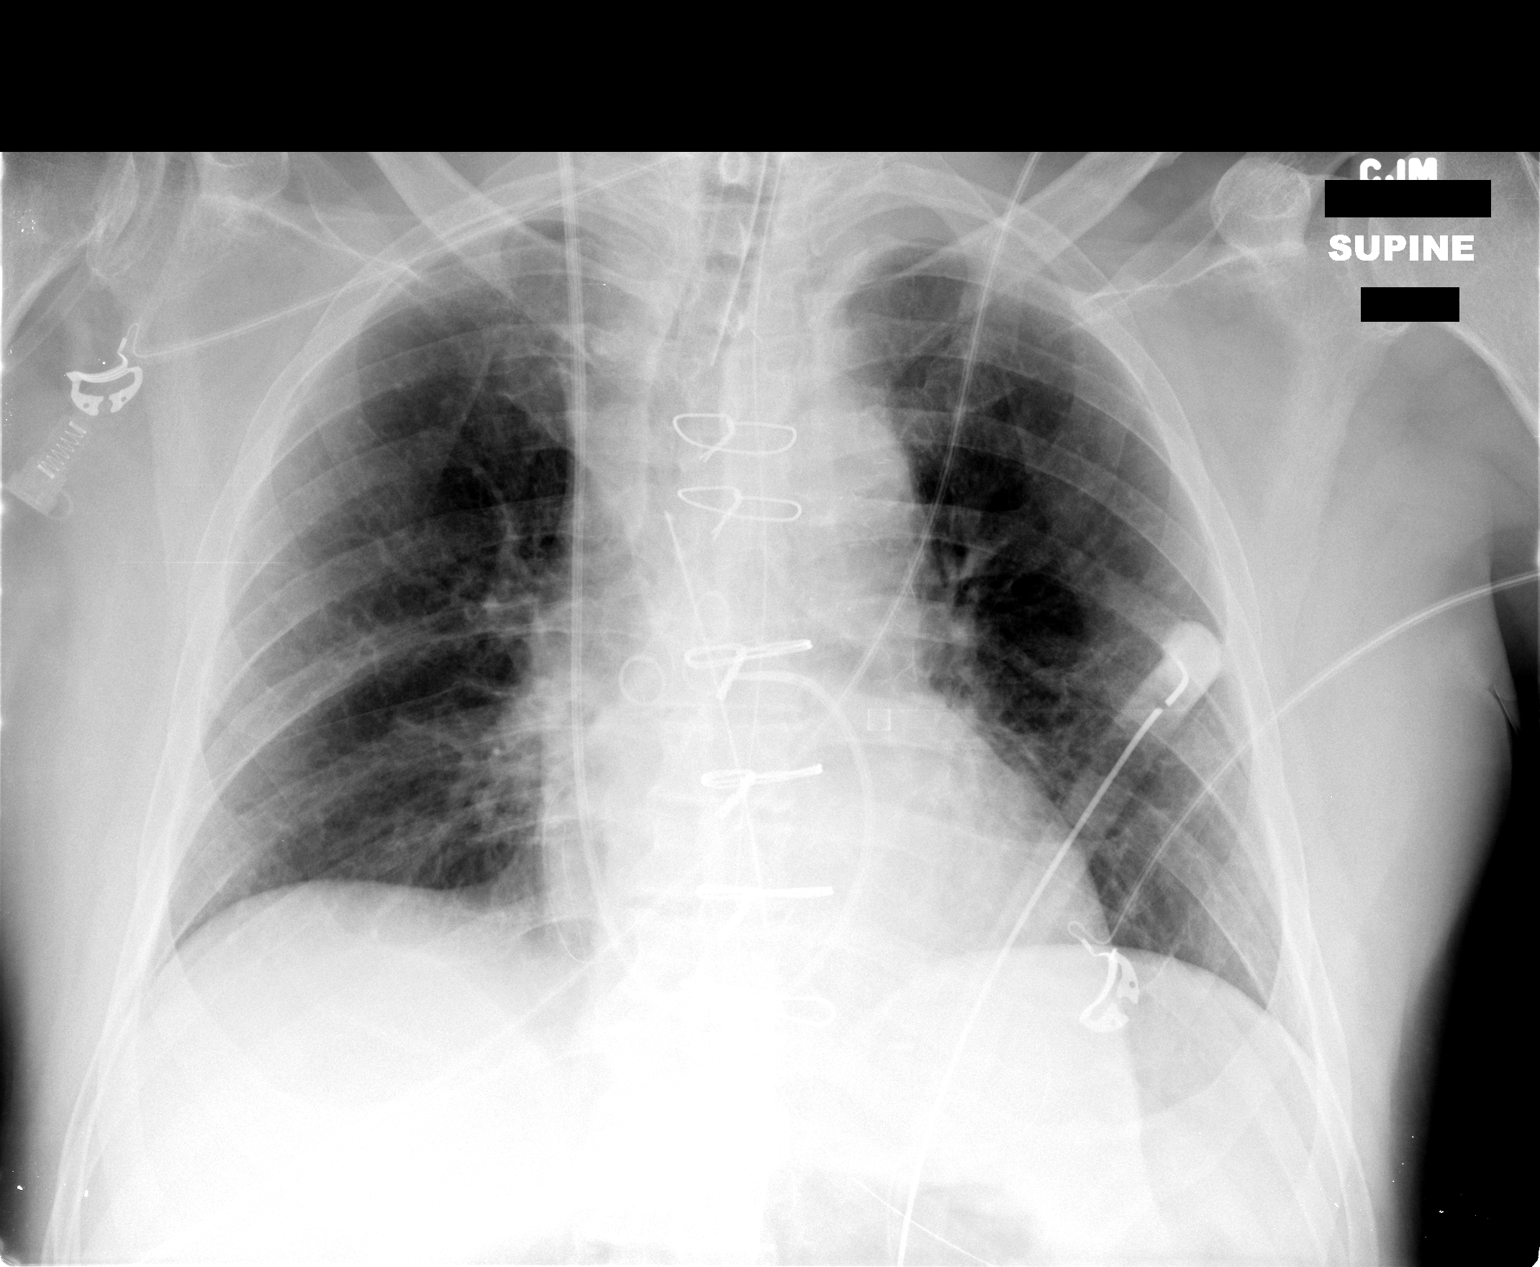

[1 of 1 positions shown; findings below may reference images not displayed]

FINDINGS: Endotracheal tube tip 4.9 cm from the carina.  Right
internal jugular vein introducer and Swan-Ganz catheter tip in the
central right pulmonary artery.  Mediastinal tubes in place.  No
pneumothorax and no CHF.  Low volumes.  Normal heart size.
Unremarkable mediastinum.
IMPRESSION: Postoperative chest.  No pneumothorax or CHF.

## 2013-03-03 IMAGING — CR DG CHEST 1V PORT
1 series · 1 of 1 positions shown · non-contrast
Comparison: 03/24/2011

CLINICAL DATA: Status post cardiac surgery

PORTABLE CHEST - 1 VIEW

[AP]
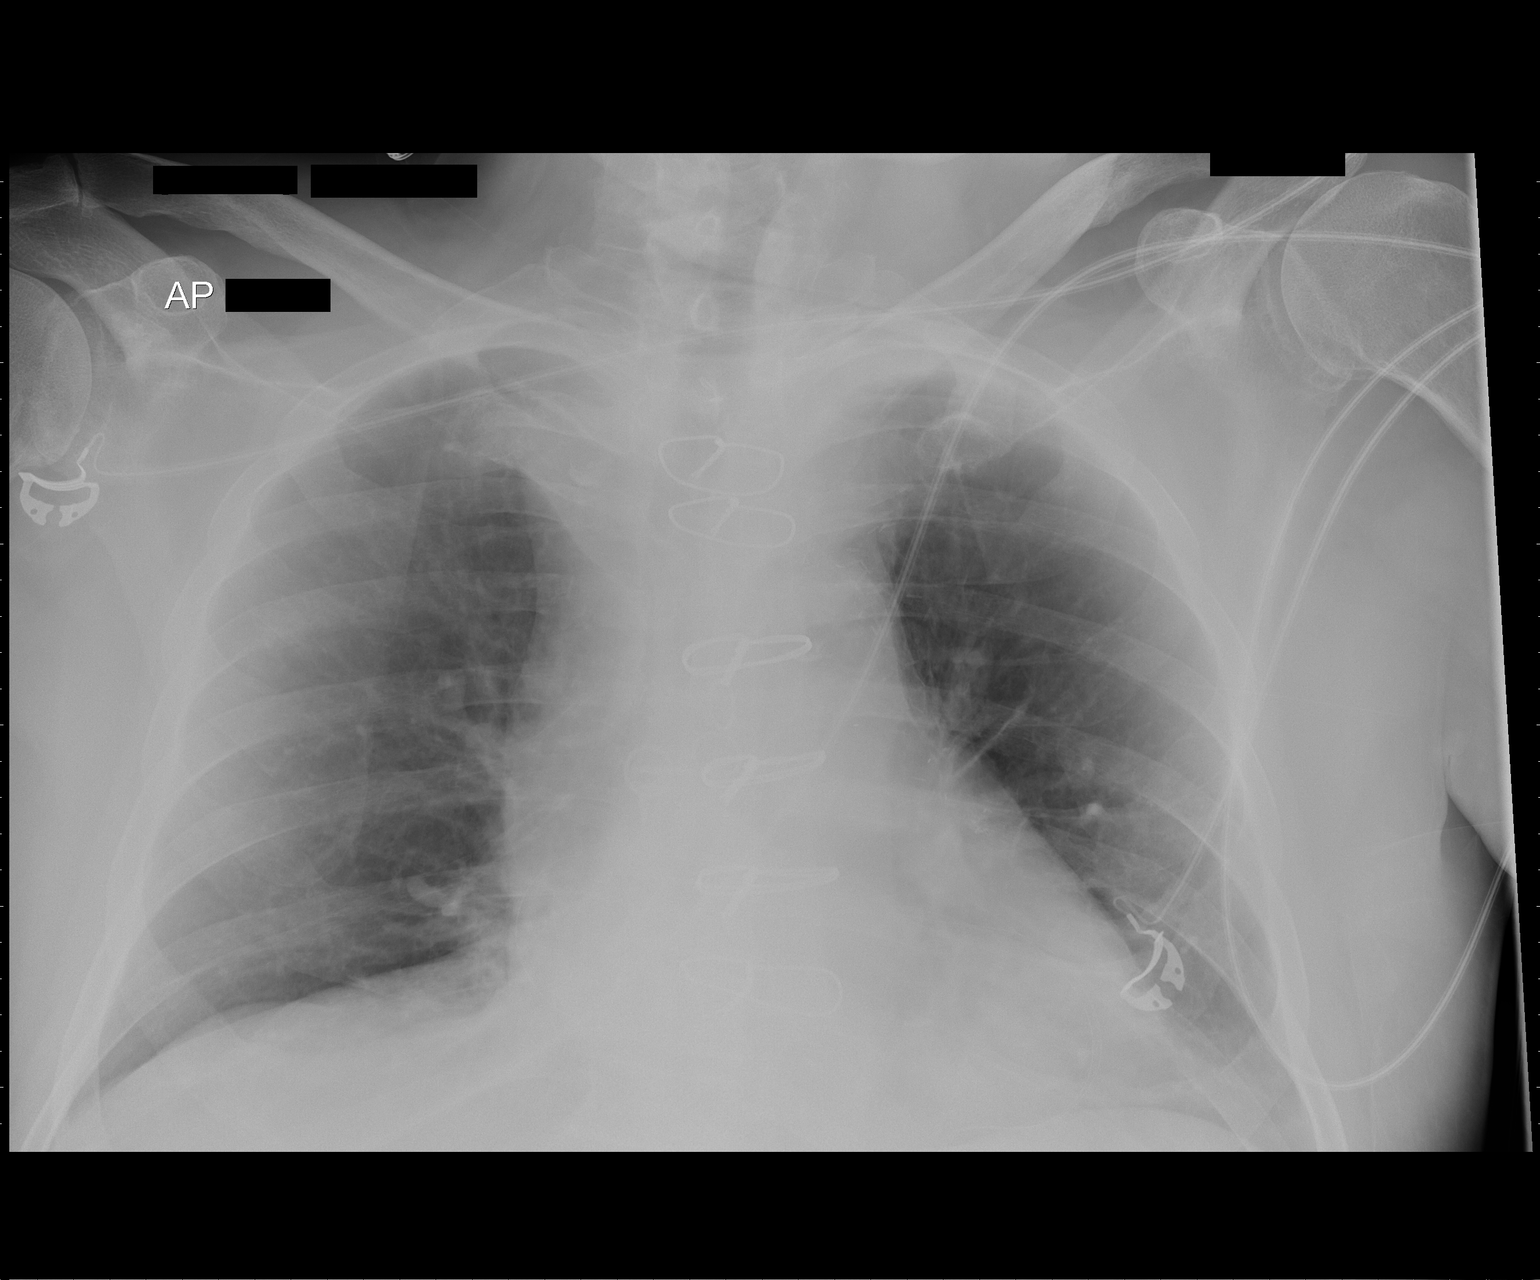

[1 of 1 positions shown; findings below may reference images not displayed]

FINDINGS: The heart size is moderately enlarged.  Prior median
sternotomy and CABG.  No pleural effusion or pulmonary edema.  Low
lung volumes.  No airspace consolidation identified.
IMPRESSION: 1.  No acute findings.
2.  Low lung volumes.

## 2013-04-13 ENCOUNTER — Other Ambulatory Visit: Payer: Self-pay | Admitting: Internal Medicine

## 2013-05-19 ENCOUNTER — Ambulatory Visit (INDEPENDENT_AMBULATORY_CARE_PROVIDER_SITE_OTHER)
Admission: RE | Admit: 2013-05-19 | Discharge: 2013-05-19 | Disposition: A | Discharge status: Home / Self Care | Payer: Medicare PPO | Source: Ambulatory Visit | Attending: Internal Medicine | Admitting: Internal Medicine

## 2013-05-19 DIAGNOSIS — R066 Hiccough: Secondary | ICD-10-CM

## 2013-06-02 ENCOUNTER — Other Ambulatory Visit: Payer: Self-pay | Admitting: *Deleted

## 2013-06-02 MED ORDER — METOPROLOL TARTRATE 50 MG PO TABS: 50.0000 mg | ORAL_TABLET | Freq: Two times a day (BID) | ORAL | Status: DC

## 2013-06-07 ENCOUNTER — Other Ambulatory Visit (HOSPITAL_COMMUNITY): Payer: Medicare PPO

## 2013-06-08 ENCOUNTER — Ambulatory Visit (HOSPITAL_COMMUNITY)
Admission: RE | Admit: 2013-06-08 | Discharge: 2013-06-08 | Disposition: A | Discharge status: Home / Self Care | Payer: Medicare PPO | Source: Ambulatory Visit | Attending: Vascular Surgery | Admitting: Vascular Surgery

## 2013-06-08 DIAGNOSIS — I6529 Occlusion and stenosis of unspecified carotid artery: Secondary | ICD-10-CM

## 2013-06-08 DIAGNOSIS — Z48812 Encounter for surgical aftercare following surgery on the circulatory system: Secondary | ICD-10-CM | POA: Insufficient documentation

## 2013-06-09 ENCOUNTER — Other Ambulatory Visit: Payer: Self-pay | Admitting: Family

## 2013-06-09 ENCOUNTER — Encounter: Payer: Self-pay | Admitting: Family

## 2013-06-09 ENCOUNTER — Encounter: Payer: Self-pay | Admitting: Vascular Surgery

## 2013-06-09 DIAGNOSIS — I6523 Occlusion and stenosis of bilateral carotid arteries: Secondary | ICD-10-CM

## 2013-06-09 DIAGNOSIS — I6529 Occlusion and stenosis of unspecified carotid artery: Secondary | ICD-10-CM

## 2013-06-09 NOTE — Patient Instructions (Signed)
Dear Mr. Grandville Silos,  Your recent Vascular Lab study indicates:   Unchanged  Since previous study  On Jun 01, 2012  Your Right Carotid Endarterectomy was on Nov. 16, 2010  Please follow up with our team in one year for repeat  Vascular Lab Study.          Stroke Prevention Some medical conditions and behaviors are associated with an increased chance of having a stroke. You may prevent a stroke by making healthy choices and managing medical conditions. HOW CAN I REDUCE MY RISK OF HAVING A STROKE?   Stay physically active. Get at least 30 minutes of activity on most or all days.  Do not smoke. It may also be helpful to avoid exposure to secondhand smoke.  Limit alcohol use. Moderate alcohol use is considered to be:  No more than 2 drinks per day for men.  No more than 1 drink per day for nonpregnant women.  Eat healthy foods. This involves  Eating 5 or more servings of fruits and vegetables a day.  Following a diet that addresses high blood pressure (hypertension), high cholesterol, diabetes, or obesity.  Manage your cholesterol levels.  A diet low in saturated fat, trans fat, and cholesterol and high in fiber may control cholesterol levels.  Take any prescribed medicines to control cholesterol as directed by your health care provider.  Manage your diabetes.  A controlled-carbohydrate, controlled-sugar diet is recommended to manage diabetes.  Take any prescribed medicines to control diabetes as directed by your health care provider.  Control your hypertension.  A low-salt (sodium), low-saturated fat, low-trans fat, and low-cholesterol diet is recommended to manage hypertension.  Take any prescribed medicines to control hypertension as directed by your health care provider.  Maintain a healthy weight.  A reduced-calorie, low-sodium, low-saturated fat, low-trans fat, low-cholesterol diet is recommended to manage weight.  Stop drug abuse.  Avoid taking birth  control pills.  Talk to your health care provider about the risks of taking birth control pills if you are over 14 years old, smoke, get migraines, or have ever had a blood clot.  Get evaluated for sleep disorders (sleep apnea).  Talk to your health care provider about getting a sleep evaluation if you snore a lot or have excessive sleepiness.  Take medicines as directed by your health care provider.  For some people, aspirin or blood thinners (anticoagulants) are helpful in reducing the risk of forming abnormal blood clots that can lead to stroke. If you have the irregular heart rhythm of atrial fibrillation, you should be on a blood thinner unless there is a good reason you cannot take them.  Understand all your medicine instructions.  Make sure that other other conditions (such as anemia or atherosclerosis) are addressed. SEEK IMMEDIATE MEDICAL CARE IF:   You have sudden weakness or numbness of the face, arm, or leg, especially on one side of the body.  Your face or eyelid droops to one side.  You have sudden confusion.  You have trouble speaking (aphasia) or understanding.  You have sudden trouble seeing in one or both eyes.  You have sudden trouble walking.  You have dizziness.  You have a loss of balance or coordination.  You have a sudden, severe headache with no known cause.  You have new chest pain or an irregular heartbeat. Any of these symptoms may represent a serious problem that is an emergency. Do not wait to see if the symptoms will go away. Get medical help at once. Call  your local emergency services  (911 in [place] Do not drive yourself to the hospital. Document Released: 02/06/2004 Document Revised: 10/19/2012 Document Reviewed: 07/01/2012 [facility] Patient Information 2014 [place].

## 2013-06-22 ENCOUNTER — Other Ambulatory Visit: Payer: Self-pay | Admitting: Internal Medicine

## 2013-06-23 ENCOUNTER — Encounter: Payer: Self-pay | Admitting: Cardiology

## 2013-06-23 ENCOUNTER — Ambulatory Visit (INDEPENDENT_AMBULATORY_CARE_PROVIDER_SITE_OTHER): Payer: Medicare PPO | Admitting: Cardiology

## 2013-06-23 VITALS — BP 116/70 | HR 67 | Ht 71.0 in | Wt 207.8 lb

## 2013-06-23 DIAGNOSIS — I6529 Occlusion and stenosis of unspecified carotid artery: Secondary | ICD-10-CM

## 2013-06-23 DIAGNOSIS — I2510 Atherosclerotic heart disease of native coronary artery without angina pectoris: Secondary | ICD-10-CM

## 2013-06-23 DIAGNOSIS — I10 Essential (primary) hypertension: Secondary | ICD-10-CM

## 2013-06-23 NOTE — Progress Notes (Signed)
HPI The patient presents for followup of CAD/CABG. He continues to abstain from cigarettes and alcohol.  He says he still has some fleeting discomfort occasionally. However, he denies any other chest pressure or any of the arm discomfort that was his previous angina. He has no new shortness of breath. He does not describe palpitations, presyncope or syncope. He is not having PND or orthopnea. He does have some limitation from a chronic leg weakness originating from chronic orthopedic problems and he walks with a walker  Allergies  Allergen Reactions  . Sertraline Hcl     REACTION: tongue bleed    Current Outpatient Prescriptions  Medication Sig Dispense Refill  . amLODipine (NORVASC) 10 MG tablet TAKE 1 TABLET BY MOUTH DAILY  90 tablet  1  . aspirin 81 MG tablet Take 81 mg by mouth daily.      . Cholecalciferol (VITAMIN D3) 1000 UNITS CAPS Take 1 capsule by mouth daily.      . isosorbide mononitrate (IMDUR) 30 MG 24 hr tablet Take 1 tablet (30 mg total) by mouth daily.  30 tablet  6  . LORazepam (ATIVAN) 0.5 MG tablet Take 1 tablet (0.5 mg total) by mouth 2 (two) times daily as needed for anxiety.  60 tablet  2  . metoprolol (LOPRESSOR) 50 MG tablet Take 1 tablet (50 mg total) by mouth 2 (two) times daily.  60 tablet  0  . nitroGLYCERIN (NITROSTAT) 0.4 MG SL tablet Place 1 tablet (0.4 mg total) under the tongue every 5 (five) minutes as needed for chest pain.  90 tablet  3  . omeprazole (PRILOSEC) 40 MG capsule TAKE 1 CAPSULE BY MOUTH EVERY DAY  90 capsule  1  . pravastatin (PRAVACHOL) 40 MG tablet TAKE 1 TABLET BY MOUTH EVERY DAY  90 tablet  1   No current facility-administered medications for this visit.    Past Medical History  Diagnosis Date  . Carotid arterial disease     s/p RCEA 11/2008 (Note: hx of negative cardiac nuc 03/2009)  . Hypertension   . Colonic polyp     cscopes 08/18/05 tics,polyps.Marland Kitchenall hyperplastic  . GERD (gastroesophageal reflux disease)     Barrets Esop. (per  bx 08/2005) and HH f/u Dr.Schooler  . Anal warts   . Anal fissure   . Hemorrhoids   . 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 Panic attack   . Osteoarthritis   . Depression   . H/O ETOH abuse     Former. Quit ~2012    Past Surgical History  Procedure Laterality Date  . Right carotid endartectomy with dacron patch angioplasty  11/2008  . Appendectomy    . Knee surgery    . Foot surgery      fx  . Total hip arthroplasty  2005    R due to OA  . Skin tags excised    . Transurethral resection of prostate    . Cataract extraction      wears contact lens L, good vision  . Coronary artery bypass graft  03/23/2011    Procedure: CORONARY ARTERY BYPASS GRAFTING (CABG);  Surgeon: Melrose Nakayama, MD;  Location: [facility];  Service: Open Heart Surgery;  Laterality: N/A;  CABG x four using bilateral greater saphenous vein, harvested endoscopically    ROS:  As stated in the HPI and negative for all other systems.  PHYSICAL EXAM BP 116/70  Pulse 67  Ht 5'  11" (1.803 m)  Wt 207 lb 12.8 oz (94.257 kg)  BMI 28.99 kg/m2 GENERAL:  Chronically ill appearing but in no acute distress NECK:  No jugular venous distention, waveform within normal limits, carotid upstroke brisk and symmetric, no bruits, no thyromegaly, CEA scar LUNGS:  Clear to auscultation bilaterally CHEST:  Well healed sternotomy scar. HEART:  PMI not displaced or sustained,S1 and S2 within normal limits, no S3, no S4, no clicks, no rubs, no murmurs ABD:  Flat, positive bowel sounds normal in frequency in pitch, no bruits, no rebound, no guarding, no midline pulsatile mass, no hepatomegaly, no splenomegaly EXT:  2 plus pulses throughout, no edema, no cyanosis no clubbing   EKG:  Sinus rhythm, rate 66, axis within normal limits, nonspecific ST-T wave changes, early transition in lead V2. 06/23/2013  ASSESSMENT AND PLAN   CAD (coronary artery disease) - He has no new symptoms.he is tolerating  the meds as listed. No change in therapy is indicated.  HYPERTENSION -  The blood pressure is at target. No change in medications is indicated. We will continue with therapeutic lifestyle changes (TLC).  Hyperlipidemia -  His LDLwas 66 earlier this year.   He will continue the meds as listed.

## 2013-06-23 NOTE — Patient Instructions (Signed)
The current medical regimen is effective;  continue present plan and medications.  Follow up in 1 year with Dr Hochrein.  You will receive a letter in the mail 2 months before you are due.  Please call us when you receive this letter to schedule your follow up appointment.  

## 2013-06-26 ENCOUNTER — Other Ambulatory Visit: Payer: Self-pay | Admitting: Internal Medicine

## 2013-06-30 ENCOUNTER — Other Ambulatory Visit: Payer: Self-pay

## 2013-06-30 MED ORDER — METOPROLOL TARTRATE 50 MG PO TABS: 50.0000 mg | ORAL_TABLET | Freq: Two times a day (BID) | ORAL | Status: DC

## 2013-07-08 ENCOUNTER — Other Ambulatory Visit: Payer: Self-pay | Admitting: Internal Medicine

## 2013-07-10 ENCOUNTER — Telehealth: Payer: Self-pay | Admitting: *Deleted

## 2013-07-10 MED ORDER — LORAZEPAM 0.5 MG PO TABS: 0.5000 mg | ORAL_TABLET | Freq: Two times a day (BID) | ORAL | Status: DC | PRN

## 2013-07-10 NOTE — Telephone Encounter (Signed)
rx faxed - [facility] [address]

## 2013-07-10 NOTE — Telephone Encounter (Signed)
done

## 2013-07-10 NOTE — Addendum Note (Signed)
Addended by: Colon Branch on: 07/10/2013 09:47 AM   Modules accepted: Orders

## 2013-07-10 NOTE — Telephone Encounter (Signed)
rx refill- lorazepam 0.5mg   Last OV- 02/20/13 # 60 / 2 rf  Last refilled - 12/27/12 # 60 / 2 rf  UDS- 08/19/12 LOW risk.

## 2013-08-21 ENCOUNTER — Ambulatory Visit (INDEPENDENT_AMBULATORY_CARE_PROVIDER_SITE_OTHER): Payer: Medicare PPO | Admitting: Internal Medicine

## 2013-08-21 ENCOUNTER — Encounter: Payer: Self-pay | Admitting: Internal Medicine

## 2013-08-21 VITALS — BP 115/60 | HR 77 | Temp 98.0°F | Wt 201.0 lb

## 2013-08-21 DIAGNOSIS — C4491 Basal cell carcinoma of skin, unspecified: Secondary | ICD-10-CM

## 2013-08-21 DIAGNOSIS — R739 Hyperglycemia, unspecified: Secondary | ICD-10-CM

## 2013-08-21 DIAGNOSIS — Z87898 Personal history of other specified conditions: Secondary | ICD-10-CM

## 2013-08-21 DIAGNOSIS — F17200 Nicotine dependence, unspecified, uncomplicated: Secondary | ICD-10-CM

## 2013-08-21 DIAGNOSIS — Z125 Encounter for screening for malignant neoplasm of prostate: Secondary | ICD-10-CM

## 2013-08-21 DIAGNOSIS — Z23 Encounter for immunization: Secondary | ICD-10-CM

## 2013-08-21 DIAGNOSIS — K2270 Barrett's esophagus without dysplasia: Secondary | ICD-10-CM

## 2013-08-21 DIAGNOSIS — Z0000 Encounter for general adult medical examination without abnormal findings: Secondary | ICD-10-CM

## 2013-08-21 DIAGNOSIS — I10 Essential (primary) hypertension: Secondary | ICD-10-CM

## 2013-08-21 DIAGNOSIS — R7309 Other abnormal glucose: Secondary | ICD-10-CM

## 2013-08-21 NOTE — Assessment & Plan Note (Signed)
Controlled, check a BMP 

## 2013-08-21 NOTE — Assessment & Plan Note (Addendum)
Has a lesion @ right cheek suspicious for [facility], refer to Dr. Allyson Sabal

## 2013-08-21 NOTE — Patient Instructions (Signed)
Get your blood work before you leave     Next visit is for routine check up regards your blood sugar , blood pressure  in 6 months  No need to come back fasting Please make an appointment    Fall Prevention and [place] cause injuries and can affect all age groups. It is possible to use preventive measures to significantly decrease the likelihood of falls. There are many simple measures which can make your home safer and prevent falls. OUTDOORS  Repair cracks and edges of walkways and driveways.  Remove high doorway thresholds.  Trim shrubbery on the main path into your home.  Have good outside lighting.  Clear walkways of tools, rocks, debris, and clutter.  Check that handrails are not broken and are securely fastened. Both sides of steps should have handrails.  Have leaves, snow, and ice cleared regularly.  Use sand or salt on walkways during winter months.  In the garage, clean up grease or oil spills. BATHROOM  Install night lights.  Install grab bars by the toilet and in the tub and shower.  Use non-skid mats or decals in the tub or shower.  Place a plastic non-slip stool in the shower to sit on, if needed.  Keep floors dry and clean up all water on the floor immediately.  Remove soap buildup in the tub or shower on a regular basis.  Secure bath mats with non-slip, double-sided rug tape.  Remove throw rugs and tripping hazards from the floors. BEDROOMS  Install night lights.  Make sure a bedside light is easy to reach.  Do not use oversized bedding.  Keep a telephone by your bedside.  Have a firm chair with side arms to use for getting dressed.  Remove throw rugs and tripping hazards from the floor. KITCHEN  Keep handles on pots and pans turned toward the center of the stove. Use back burners when possible.  Clean up spills quickly and allow time for drying.  Avoid walking on wet floors.  Avoid hot utensils and knives.  Position  shelves so they are not too high or low.  Place commonly used objects within easy reach.  If necessary, use a sturdy step stool with a grab bar when reaching.  Keep electrical cables out of the way.  Do not use floor polish or wax that makes floors slippery. If you must use wax, use non-skid floor wax.  Remove throw rugs and tripping hazards from the floor. STAIRWAYS  Never leave objects on stairs.  Place handrails on both sides of stairways and use them. Fix any loose handrails. Make sure handrails on both sides of the stairways are as long as the stairs.  Check carpeting to make sure it is firmly attached along stairs. Make repairs to worn or loose carpet promptly.  Avoid placing throw rugs at the top or bottom of stairways, or properly secure the rug with carpet tape to prevent slippage. Get rid of throw rugs, if possible.  Have an electrician put in a light switch at the top and bottom of the stairs. OTHER FALL PREVENTION TIPS  Wear low-heel or rubber-soled shoes that are supportive and fit well. Wear closed toe shoes.  When using a stepladder, make sure it is fully opened and both spreaders are firmly locked. Do not climb a closed stepladder.  Add color or contrast paint or tape to grab bars and handrails in your home. Place contrasting color strips on first and last steps.  Learn and use  mobility aids as needed. Install an electrical emergency response system.  Turn on lights to avoid dark areas. Replace light bulbs that burn out immediately. Get light switches that glow.  Arrange furniture to create clear pathways. Keep furniture in the same place.  Firmly attach carpet with non-skid or double-sided tape.  Eliminate uneven floor surfaces.  Select a carpet pattern that does not visually hide the edge of steps.  Be aware of all pets. OTHER HOME SAFETY TIPS  Set the water temperature for 120 F (48.8 C).  Keep emergency numbers on or near the telephone.  Keep  smoke detectors on every level of the home and near sleeping areas. Document Released: 12/19/2001 Document Revised: 06/30/2011 Document Reviewed: 03/20/2011 [facility] Patient Information 2015 [place], [place]. This information is not intended to replace advice given to you by your health care provider. Make sure you discuss any questions you have with your health care provider.

## 2013-08-21 NOTE — Assessment & Plan Note (Addendum)
Td ~ 2009  pneumonia shot-2012  prevnar -- today Pt reports had a zostavax   Cscope 2007--- 3 hyperplastic polyps Dr Michail Sermon  cscope 2014 , + polyps, next per GI counseled about diet and exercise  Has quit  alcohol, praised

## 2013-08-21 NOTE — Assessment & Plan Note (Signed)
DRE okay today, PSA we slightly increased velocity, recheck a PSA

## 2013-08-21 NOTE — Progress Notes (Signed)
Pre visit review using our clinic review tool, if applicable. No additional management support is needed unless otherwise documented below in the visit note. 

## 2013-08-21 NOTE — Assessment & Plan Note (Signed)
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 Next EGD per GI , Dr Michail Sermon

## 2013-08-21 NOTE — Assessment & Plan Note (Signed)
Asymptomatic, currently controlling cardiovascular risk factors

## 2013-08-21 NOTE — Progress Notes (Signed)
Subjective:    Patient ID: [name], male    DOB: [birth year], 73 y.o.   MRN: [id number]  DOS:  08/21/2013 Type of visit - description:    Medicare Physical   1. Risk factors based on Past M, S, F history:reviewed   2. Physical Activities: not very active  3. Depression/mood: h/o chronic depression however  today denies problems   4. Hearing: No problemss noted or reported  5. ADL's: needs help w/ all ADLs, girlfriend helps (they don't live together) 6. Fall Risk:  Had a fall 3 weeks ago, has knee pain, getting better ; he is high risk, see instructions; uses his  walker   7. Home Safety: does feel safe at home   8. Height, weight, &visual acuity: see VS, s/p  cataract surgery B with good results   9. Counseling: done   10. Labs ordered based on risk factors: if needed   11. Referral Coordination, if needed   12. Care Plan, see assessment and plan   13. Cognitive Assessment, motor habilities limited d/t OA, cognition seems normal for age    in addition we discussed the following CAD, saw cardiology recently, found to be stable, has not used any nitroglycerin Anxiety, on lorazepam on average 1 a day,Symptoms controlled Hypertension, good medication compliance, reports ambulatory BPs are within normal  ROS Denies chest pain , SOB at baseline, no lower extremity edema No nausea, vomiting, diarrhea or blood in the stools No GERD or dysphagia No cough or hemoptysis No dysuria, gross hematuria or difficulty urinating  Past Medical History  Diagnosis Date  . Carotid arterial disease     s/p RCEA 11/2008 (Note: hx of negative cardiac nuc 03/2009)  . Hypertension   . Colonic polyp     cscopes 08/18/05 tics,polyps.Marland Kitchenall hyperplastic  . GERD (gastroesophageal reflux disease)     Barrets Esop. (per bx 08/2005) and HH f/u Dr.Schooler  . Anal warts   . Anal fissure   . Hemorrhoids   . 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 Panic attack   .  Osteoarthritis   . Depression   . H/O ETOH abuse     Former. Quit ~2012  . BPH (benign prostatic hyperplasia)     s/p surgery    Past Surgical History  Procedure Laterality Date  . Right carotid endartectomy with dacron patch angioplasty  11/2008  . Appendectomy    . Knee surgery    . Foot surgery      fx  . Total hip arthroplasty  2005    R due to OA  . Skin tags excised    . Transurethral resection of prostate    . Cataract extraction      wears contact lens L, good vision  . Coronary artery bypass graft  03/23/2011    Procedure: CORONARY ARTERY BYPASS GRAFTING (CABG);  Surgeon: Melrose Nakayama, MD;  Location: [facility];  Service: Open Heart Surgery;  Laterality: N/A;  CABG x four using bilateral greater saphenous vein, harvested endoscopically    History   Social History  . Marital Status: Married    Spouse Name: N/A    Number of Children: 2  . Years of Education: N/A   Occupational History  . retired     Social History Main Topics  . Smoking status: Current Some Day Smoker -- 1.00 packs/day for 52 years    Types: Cigarettes  . Smokeless tobacco:  Never Used     Comment: < 1 ppd   . Alcohol Use: No     Comment: currently not drinking, former abuse   . Drug Use: No  . Sexual Activity: No   Other Topics Concern  . Not on file   Social History Narrative   Mother lives with him, 2 children [place], [place]), has a girlfriend , she visits daily           Family History  Problem Relation Age of Onset  . Lung cancer Father     mets to brain- deceased  . Arthritis Mother   . Hypertension Mother   . Coronary artery disease Neg Hx   . Colon cancer Neg Hx   . Prostate cancer Neg Hx   . Diabetes Paternal Aunt   . Stroke Paternal Grandfather        Medication List       This list is accurate as of: 08/21/13 11:59 PM.  Always use your most recent med list.               amLODipine 10 MG tablet  Commonly known as:  NORVASC  TAKE 1 TABLET BY MOUTH DAILY       aspirin 81 MG tablet  Take 81 mg by mouth daily.     isosorbide mononitrate 30 MG 24 hr tablet  Commonly known as:  IMDUR  Take 1 tablet (30 mg total) by mouth daily.     LORazepam 0.5 MG tablet  Commonly known as:  ATIVAN  Take 1 tablet (0.5 mg total) by mouth 2 (two) times daily as needed for anxiety.     metoprolol 50 MG tablet  Commonly known as:  LOPRESSOR  Take 1 tablet (50 mg total) by mouth 2 (two) times daily.     nitroGLYCERIN 0.4 MG SL tablet  Commonly known as:  NITROSTAT  Place 1 tablet (0.4 mg total) under the tongue every 5 (five) minutes as needed for chest pain.     omeprazole 40 MG capsule  Commonly known as:  PRILOSEC  TAKE 1 CAPSULE BY MOUTH EVERY DAY     pravastatin 40 MG tablet  Commonly known as:  PRAVACHOL  TAKE 1 TABLET BY MOUTH EVERY DAY     Vitamin D3 1000 UNITS Caps  Take 1 capsule by mouth daily.           Objective:   Physical Exam BP 115/60  Pulse 77  Temp(Src) 98 F (36.7 C)  Wt 201 lb (91.173 kg)  SpO2 97% General -- alert, well-developed, NAD.  HEENT-- Not pale.  Lungs -- normal respiratory effort, no intercostal retractions, no accessory muscle use, and normal breath sounds.  Heart-- normal rate, regular rhythm, no murmur.   Rectal-- No external abnormalities noted. Normal sphincter tone. No rectal masses or tenderness. Stool brown  Prostate--Prostate gland firm and smooth, no enlargement, nodularity, tenderness, mass, asymmetry or induration. Extremities-- no pretibial edema bilaterally  Neurologic--  alert & oriented X3. Speech normal, gait slow and difficult, poor balance, uses a walker  Psych-- Cognition and judgment appear intact. Cooperative with normal attention span and concentration. No anxious or depressed appearing.        Assessment & Plan:

## 2013-08-21 NOTE — Assessment & Plan Note (Signed)
Patient relapsed, smoking again, in the past she couldn't tolerate Chantix or nicotine supplementation. Counseled about alternative treatments, acupuncture?

## 2013-08-21 NOTE — Assessment & Plan Note (Signed)
Recheck A1c 

## 2013-08-22 ENCOUNTER — Telehealth: Payer: Self-pay | Admitting: Internal Medicine

## 2013-08-22 LAB — CBC WITH DIFFERENTIAL/PLATELET
BASOS PCT: 0.6 % (ref 0.0–3.0)
Basophils Absolute: 0.1 10*3/uL (ref 0.0–0.1)
EOS PCT: 2.1 % (ref 0.0–5.0)
Eosinophils Absolute: 0.2 10*3/uL (ref 0.0–0.7)
HCT: 42.6 % (ref 39.0–52.0)
Hemoglobin: 14.4 g/dL (ref 13.0–17.0)
LYMPHS PCT: 39.6 % (ref 12.0–46.0)
Lymphs Abs: 3.1 10*3/uL (ref 0.7–4.0)
MCHC: 33.8 g/dL (ref 30.0–36.0)
MCV: 95.1 fl (ref 78.0–100.0)
MONOS PCT: 11.7 % (ref 3.0–12.0)
Monocytes Absolute: 0.9 10*3/uL (ref 0.1–1.0)
Neutro Abs: 3.6 10*3/uL (ref 1.4–7.7)
Neutrophils Relative %: 46 % (ref 43.0–77.0)
Platelets: 203 10*3/uL (ref 150.0–400.0)
RBC: 4.48 Mil/uL (ref 4.22–5.81)
RDW: 14.1 % (ref 11.5–15.5)
WBC: 7.9 10*3/uL (ref 4.0–10.5)

## 2013-08-22 LAB — BASIC METABOLIC PANEL
BUN: 10 mg/dL (ref 6–23)
CALCIUM: 8.7 mg/dL (ref 8.4–10.5)
CO2: 17 mEq/L — ABNORMAL LOW (ref 19–32)
CREATININE: 1.3 mg/dL (ref 0.4–1.5)
Chloride: 108 mEq/L (ref 96–112)
GFR: 59.93 mL/min — AB (ref >60.00)
Glucose, Bld: 101 mg/dL — ABNORMAL HIGH (ref 70–99)
Potassium: 3.9 mEq/L (ref 3.5–5.1)
Sodium: 139 mEq/L (ref 135–145)

## 2013-08-22 LAB — HEMOGLOBIN A1C: Hgb A1c MFr Bld: 5.8 % (ref 4.6–6.5)

## 2013-08-22 LAB — PSA: PSA: 2.46 ng/mL (ref 0.10–4.00)

## 2013-08-22 NOTE — Telephone Encounter (Signed)
Relevant patient education assigned to patient using Emmi. ° °

## 2013-08-31 ENCOUNTER — Other Ambulatory Visit: Payer: Self-pay | Admitting: *Deleted

## 2013-08-31 MED ORDER — ISOSORBIDE MONONITRATE ER 30 MG PO TB24: 30.0000 mg | ORAL_TABLET | Freq: Every day | ORAL | Status: DC

## 2013-10-06 ENCOUNTER — Other Ambulatory Visit: Payer: Self-pay | Admitting: Internal Medicine

## 2013-11-02 ENCOUNTER — Telehealth: Payer: Self-pay | Admitting: Internal Medicine

## 2013-11-02 NOTE — Telephone Encounter (Signed)
Pt dropped off jury summons, pt requesting a letter or please fill out form excusing pt from jury duty due to age and physical condition. Jury summons form placed in mailbox. [name] Duty is Monday, 12/25/2013)

## 2013-11-02 NOTE — Telephone Encounter (Signed)
Please advise 

## 2013-11-03 NOTE — Telephone Encounter (Signed)
Letter printed.

## 2013-11-03 NOTE — Telephone Encounter (Signed)
Please send a letter To Whom It May Concern Mr. [name] is a patient of mine, he has multiple medical problems including arthritis, heart disease, hypertension. 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

## 2013-11-03 NOTE — Telephone Encounter (Signed)
LMOM for Pt informing him letter and jury summons form is ready to be picked up.

## 2013-11-03 NOTE — Telephone Encounter (Signed)
Placed at front desk for pick up.

## 2013-11-21 ENCOUNTER — Telehealth: Payer: Self-pay | Admitting: Internal Medicine

## 2013-11-21 NOTE — Telephone Encounter (Signed)
Caller name:laverne- [facility] Relation to MM:ITVI Call back [place]:  Reason for call: following up or order for back brace for the pt, states it was faxed 11/3 and then again on 11/9 .

## 2013-11-21 NOTE — Telephone Encounter (Signed)
Fax has yet to be received. JG//CMA

## 2013-11-21 NOTE — Telephone Encounter (Signed)
Have you see fax for this Pt?

## 2013-12-21 ENCOUNTER — Encounter (HOSPITAL_COMMUNITY): Payer: Self-pay | Admitting: Cardiovascular Disease

## 2013-12-26 ENCOUNTER — Other Ambulatory Visit: Payer: Self-pay | Admitting: Internal Medicine

## 2014-01-01 ENCOUNTER — Other Ambulatory Visit: Payer: Self-pay | Admitting: Internal Medicine

## 2014-01-15 ENCOUNTER — Telehealth: Payer: Self-pay | Admitting: Internal Medicine

## 2014-01-15 NOTE — Telephone Encounter (Signed)
done

## 2014-01-15 NOTE — Telephone Encounter (Signed)
Faxed to [facility] pharmacy.  

## 2014-01-15 NOTE — Telephone Encounter (Signed)
Pt is requesting refill on Lorazepam.  Last OV: 08/21/2013 Last Fill: 07/10/2013 # 60 3RF UDS: 08/19/2012 Low risk  Please advise.

## 2014-02-13 ENCOUNTER — Other Ambulatory Visit: Payer: Self-pay | Admitting: Physician Assistant

## 2014-02-23 ENCOUNTER — Encounter: Payer: Self-pay | Admitting: Internal Medicine

## 2014-02-23 ENCOUNTER — Ambulatory Visit (INDEPENDENT_AMBULATORY_CARE_PROVIDER_SITE_OTHER): Payer: Medicare PPO | Admitting: Internal Medicine

## 2014-02-23 VITALS — BP 107/69 | HR 62 | Temp 98.3°F | Ht 71.0 in | Wt 195.1 lb

## 2014-02-23 DIAGNOSIS — I10 Essential (primary) hypertension: Secondary | ICD-10-CM

## 2014-02-23 DIAGNOSIS — F418 Other specified anxiety disorders: Secondary | ICD-10-CM

## 2014-02-23 DIAGNOSIS — M479 Spondylosis, unspecified: Secondary | ICD-10-CM

## 2014-02-23 DIAGNOSIS — R269 Unspecified abnormalities of gait and mobility: Secondary | ICD-10-CM

## 2014-02-23 DIAGNOSIS — F329 Major depressive disorder, single episode, unspecified: Secondary | ICD-10-CM

## 2014-02-23 DIAGNOSIS — R739 Hyperglycemia, unspecified: Secondary | ICD-10-CM

## 2014-02-23 DIAGNOSIS — F419 Anxiety disorder, unspecified: Principal | ICD-10-CM

## 2014-02-23 NOTE — Patient Instructions (Addendum)
Go to the lab and provide a urine sample for a UDS  Next visit 08-2014, please make an appointment for a physical exam, come back fasting

## 2014-02-23 NOTE — Progress Notes (Signed)
Subjective:    Patient ID: [name], male    DOB: 1940/05/07, 74 y.o.   MRN: [id number]  DOS:  02/23/2014 Type of visit - description : rov Interval history: No new concerns, continue with back and right hip pain, needs parking permit. Good compliance of medications Still smoking Ambulatory BPs always in the 120s   Review of Systems No chest pain (other than the chest pain on and off the last few seconds and he has for long time) no difficulty breathing or lower extremity edema No nausea, vomiting, diarrhea or blood in the stools. No bladder or bowel incontinence. Lost his mom, had a hard time emotionally but denies severe depression or suicidal ideas  Past Medical History  Diagnosis Date  . Carotid arterial disease     s/p RCEA 11/2008 (Note: hx of negative cardiac nuc 03/2009)  . Hypertension   . Colonic polyp     cscopes 08/18/05 tics,polyps.Marland Kitchenall hyperplastic  . GERD (gastroesophageal reflux disease)     Barrets Esop. (per bx 08/2005) and HH f/u Dr.Schooler  . Anal warts   . Anal fissure   . Hemorrhoids   . 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 Panic attack   . Osteoarthritis   . Depression   . H/O ETOH abuse     Former. Quit ~2012  . BPH (benign prostatic hyperplasia)     s/p surgery  . Anxiety and depression 02/14/2009    Qualifier: Diagnosis of  By: Larose Kells MD, [place]     Past Surgical History  Procedure Laterality Date  . Right carotid endartectomy with dacron patch angioplasty  11/2008  . Appendectomy    . Knee surgery    . Foot surgery      fx  . Total hip arthroplasty  2005    R due to OA  . Skin tags excised    . Transurethral resection of prostate    . Cataract extraction      wears contact lens L, good vision  . Coronary artery bypass graft  03/23/2011    Procedure: CORONARY ARTERY BYPASS GRAFTING (CABG);  Surgeon: Melrose Nakayama, MD;  Location: [facility];  Service: Open Heart Surgery;  Laterality: N/A;  CABG  x four using bilateral greater saphenous vein, harvested endoscopically  . Left heart catheterization with coronary angiogram N/A 03/19/2011    Procedure: LEFT HEART CATHETERIZATION WITH CORONARY ANGIOGRAM;  Surgeon: Thayer Headings, MD;  Location: [facility] CATH LAB;  Service: Cardiovascular;  Laterality: N/A;    History   Social History  . Marital Status: Married    Spouse Name: N/A  . Number of Children: 2  . Years of Education: N/A   Occupational History  . retired     Social History Main Topics  . Smoking status: Current Some Day Smoker -- 1.00 packs/day for 52 years    Types: Cigarettes  . Smokeless tobacco: Never Used     Comment: < 1 ppd   . Alcohol Use: No     Comment: currently not drinking, former abuse   . Drug Use: No  . Sexual Activity: No   Other Topics Concern  . Not on file   Social History Narrative   Mother lives with him, 2 children [place], [place]), has a girlfriend , she visits daily              Medication List       This  list is accurate as of: 02/23/14 11:59 PM.  Always use your most recent med list.               amLODipine 10 MG tablet  Commonly known as:  NORVASC  TAKE 1 TABLET BY MOUTH DAILY     aspirin 81 MG tablet  Take 81 mg by mouth daily.     isosorbide mononitrate 30 MG 24 hr tablet  Commonly known as:  IMDUR  Take 1 tablet (30 mg total) by mouth daily.     LORazepam 0.5 MG tablet  Commonly known as:  ATIVAN  TAKE 1 TABLET BY MOUTH TWICE DAILY AS NEEDED FOR ANXIETY     metoprolol 50 MG tablet  Commonly known as:  LOPRESSOR  Take 1 tablet (50 mg total) by mouth 2 (two) times daily.     nitroGLYCERIN 0.4 MG SL tablet  Commonly known as:  NITROSTAT  Place 1 tablet (0.4 mg total) under the tongue every 5 (five) minutes as needed for chest pain.     omeprazole 40 MG capsule  Commonly known as:  PRILOSEC  TAKE 1 CAPSULE BY MOUTH EVERY DAY     pravastatin 40 MG tablet  Commonly known as:  PRAVACHOL  TAKE 1 TABLET BY MOUTH  EVERY DAY     Vitamin D3 1000 UNITS Caps  Take 1 capsule by mouth daily.           Objective:   Physical Exam  Constitutional: He is oriented to person, place, and time. He appears well-developed. No distress.  HENT:  Head: Normocephalic and atraumatic.  Cardiovascular:  RRR, no murmur, rub or gallop  Pulmonary/Chest: Effort normal. No respiratory distress.  CTA B  Musculoskeletal: Normal range of motion. He exhibits no edema or tenderness.  Neurological: He is alert and oriented to person, place, and time.  Speech normal,  Sitting in a wheelchair  Skin: Skin is warm and dry. No pallor.  Psychiatric: He has a normal mood and affect. His behavior is normal. Judgment and thought content normal.  Vitals reviewed.        Assessment & Plan:   Declined labs      Problem List Items Addressed This Visit      Cardiovascular and Mediastinum   Essential hypertension - Primary    Seems well-controlled, continue amlodipine        Musculoskeletal and Integument   Osteoarthritis    Chronic back and hip  pain,unchanged. In the last few years has visit a number of orthopedic doctors mostly in [place] "nobody know what I have".  Gait very difficult due to pain. Wonders if he could take vitamin D which in the past help, I see no contraindication.         Other   Anxiety and depression    At this point needs Ativan  only, check a UDS      GAIT DISTURBANCE    Ongoing problem, due to chronic back and hip pain. Needs a parking permit which is provided      Hyperglycemia    Last A1c satisfactory

## 2014-02-23 NOTE — Progress Notes (Signed)
Pre visit review using our clinic review tool, if applicable. No additional management support is needed unless otherwise documented below in the visit note. 

## 2014-02-25 ENCOUNTER — Encounter: Payer: Self-pay | Admitting: Internal Medicine

## 2014-02-25 NOTE — Assessment & Plan Note (Signed)
Seems well-controlled, continue amlodipine

## 2014-02-25 NOTE — Assessment & Plan Note (Signed)
Last A1c satisfactory

## 2014-02-25 NOTE — Assessment & Plan Note (Addendum)
Chronic back and hip  pain,unchanged. In the last few years has visit a number of orthopedic doctors mostly in [place] "nobody know what I have".  Gait very difficult due to pain. Wonders if he could take vitamin D which in the past help, I see no contraindication.

## 2014-02-25 NOTE — Assessment & Plan Note (Signed)
Ongoing problem, due to chronic back and hip pain. Needs a parking permit which is provided

## 2014-02-25 NOTE — Assessment & Plan Note (Signed)
At this point needs Ativan  only, check a UDS

## 2014-04-04 ENCOUNTER — Other Ambulatory Visit: Payer: Self-pay | Admitting: Internal Medicine

## 2014-04-10 ENCOUNTER — Other Ambulatory Visit: Payer: Self-pay | Admitting: Internal Medicine

## 2014-04-10 NOTE — Telephone Encounter (Signed)
Print #60 and 3 refills 

## 2014-04-10 NOTE — Telephone Encounter (Signed)
Rx printed, awaiting signature by Dr. Paz.  

## 2014-04-10 NOTE — Telephone Encounter (Signed)
Rx faxed to [facility] pharmacy.  

## 2014-04-10 NOTE — Telephone Encounter (Signed)
Pt is requesting refill on Lorazepam.  Last OV: 02/23/2014 Last Fill: 01/15/2014 # 60 2RF UDS: 08/19/2012 Low risk  Please advise.

## 2014-05-04 ENCOUNTER — Other Ambulatory Visit: Payer: Self-pay | Admitting: Cardiology

## 2014-06-04 ENCOUNTER — Encounter: Payer: Self-pay | Admitting: Vascular Surgery

## 2014-06-05 ENCOUNTER — Encounter: Payer: Self-pay | Admitting: Vascular Surgery

## 2014-06-06 ENCOUNTER — Ambulatory Visit (INDEPENDENT_AMBULATORY_CARE_PROVIDER_SITE_OTHER): Payer: Medicare PPO | Admitting: Vascular Surgery

## 2014-06-06 ENCOUNTER — Ambulatory Visit (HOSPITAL_COMMUNITY)
Admission: RE | Admit: 2014-06-06 | Discharge: 2014-06-06 | Disposition: A | Discharge status: Home / Self Care | Payer: Medicare PPO | Source: Ambulatory Visit | Attending: Vascular Surgery | Admitting: Vascular Surgery

## 2014-06-06 ENCOUNTER — Encounter: Payer: Self-pay | Admitting: Vascular Surgery

## 2014-06-06 VITALS — BP 123/66 | HR 68 | Resp 18 | Ht 71.0 in | Wt 200.4 lb

## 2014-06-06 DIAGNOSIS — I6523 Occlusion and stenosis of bilateral carotid arteries: Secondary | ICD-10-CM | POA: Diagnosis present

## 2014-06-06 DIAGNOSIS — I6522 Occlusion and stenosis of left carotid artery: Secondary | ICD-10-CM | POA: Diagnosis not present

## 2014-06-06 DIAGNOSIS — I6521 Occlusion and stenosis of right carotid artery: Secondary | ICD-10-CM

## 2014-06-06 DIAGNOSIS — Z48812 Encounter for surgical aftercare following surgery on the circulatory system: Secondary | ICD-10-CM

## 2014-06-06 NOTE — Progress Notes (Signed)
[facility]  Patient name: [name] MRN: [id number] DOB: [birth year] Sex: male  REASON FOR VISIT: Follow up of carotid disease.  HPI: [name] is a 74 y.o. male who underwent a right carotid endarterectomy in 2010.  I last saw him in May 2014. He had a widely patent right carotid endarterectomy site with no significant stenosis on the left. He comes in for a 2 year follow up visit.  Since I saw him last, he denies any history of stroke, TIAs, expressive or receptive aphasia, or amaurosis fugax. He is on aspirin. He is on a statin. He does continue to smoke some.   Past Medical History  Diagnosis Date  . Carotid arterial disease     s/p RCEA 11/2008 (Note: hx of negative cardiac nuc 03/2009)  . Hypertension   . Colonic polyp     cscopes 08/18/05 tics,polyps.Marland Kitchenall hyperplastic  . GERD (gastroesophageal reflux disease)     Barrets Esop. (per bx 08/2005) and HH f/u Dr.Schooler  . Anal warts   . Anal fissure   . Hemorrhoids   . 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 Panic attack   . Osteoarthritis   . Depression   . H/O ETOH abuse     Former. Quit ~2012  . BPH (benign prostatic hyperplasia)     s/p surgery  . Anxiety and depression 02/14/2009    Qualifier: Diagnosis of  By: Larose Kells MD, [place]    Family History  Problem Relation Age of Onset  . Lung cancer Father     mets to brain- deceased  . Arthritis Mother   . Hypertension Mother   . Coronary artery disease Neg Hx   . Colon cancer Neg Hx   . Prostate cancer Neg Hx   . Diabetes Paternal Aunt   . Stroke Paternal Grandfather    SOCIAL HISTORY: History  Substance Use Topics  . Smoking status: Current Some Day Smoker -- 1.00 packs/day for 52 years    Types: Cigarettes  . Smokeless tobacco: Never Used     Comment: < 1 ppd   . Alcohol Use: No     Comment: currently not drinking, former abuse    Allergies  Allergen Reactions  . Sertraline Hcl      REACTION: heart palpitation    Current Outpatient Prescriptions  Medication Sig Dispense Refill  . amLODipine (NORVASC) 10 MG tablet TAKE 1 TABLET BY MOUTH DAILY 90 tablet 2  . aspirin 81 MG tablet Take 81 mg by mouth daily.    . Cholecalciferol (VITAMIN D3) 1000 UNITS CAPS Take 1 capsule by mouth daily. Takes 1 2000 unit capsule daily.    . isosorbide mononitrate (IMDUR) 30 MG 24 hr tablet TAKE 1 TABLET BY MOUTH EVERY DAY 90 tablet 0  . LORazepam (ATIVAN) 0.5 MG tablet TAKE 1 TABLET BY MOUTH TWICE DAILY AS NEEDED FOR ANXIETY 60 tablet 3  . metoprolol (LOPRESSOR) 50 MG tablet Take 1 tablet (50 mg total) by mouth 2 (two) times daily. 60 tablet 11  . nitroGLYCERIN (NITROSTAT) 0.4 MG SL tablet Place 1 tablet (0.4 mg total) under the tongue every 5 (five) minutes as needed for chest pain. 90 tablet 3  . omeprazole (PRILOSEC) 40 MG capsule TAKE 1 CAPSULE BY MOUTH EVERY DAY 90 capsule 1  . pravastatin (PRAVACHOL) 40 MG tablet TAKE 1 TABLET BY MOUTH EVERY DAY 90 tablet 1   No  current facility-administered medications for this visit.   REVIEW OF SYSTEMS: Valu.Nieves ] denotes positive finding; [  ] denotes negative finding  CARDIOVASCULAR:  Valu.Nieves ] chest pain he occasionally gets some very mild fleeting chest pain.   [ ]  chest pressure   [ ]  palpitations   [ ]  orthopnea [ ]  dyspnea on exertion   [ ]  claudication   [ ]  rest pain   [ ]  DVT   [ ]  phlebitis PULMONARY:   [ ]  productive cough   [ ]  asthma   [ ]  wheezing NEUROLOGIC:   [ ]  weakness  [ ]  paresthesias  [ ]  aphasia  [ ]  amaurosis  [ ]  dizziness HEMATOLOGIC:   [ ]  bleeding problems   [ ]  clotting disorders MUSCULOSKELETAL:  [ ]  joint pain   [ ]  joint swelling [ ]  leg swelling GASTROINTESTINAL: [ ]   blood in stool  [ ]   hematemesis GENITOURINARY:  [ ]   dysuria  [ ]   hematuria PSYCHIATRIC:  [ ]  history of major depression INTEGUMENTARY:  [ ]  rashes  [ ]  ulcers CONSTITUTIONAL:  [ ]  fever   [ ]  chills  PHYSICAL EXAM: Filed Vitals:   06/06/14 1257    BP: 123/66  Pulse: 68  Resp: 18  Height: 5\' 11"  (1.803 m)  Weight: 200 lb 6.4 oz (90.901 kg)   GENERAL: The patient is a well-nourished male, in no acute distress. The vital signs are documented above. CARDIOVASCULAR: There is a regular rate and rhythm. I do not protect carotid bruits. PULMONARY: There is good air exchange bilaterally without wheezing or rales. ABDOMEN: Soft and non-tender with normal pitched bowel sounds.  MUSCULOSKELETAL: There are no major deformities or cyanosis. NEUROLOGIC: No focal weakness or paresthesias are detected. SKIN: There are no ulcers or rashes noted. PSYCHIATRIC: The patient has a normal affect.  DATA:  I have independently interpreted his carotid duplex can which shows a widely patent right carotid endarterectomy site without evidence of restenosis. He has no significant stenosis on the left. Both vertebral arteries are patent with antegrade flow.  MEDICAL ISSUES: STATUS POST RIGHT CAROTID ENDARTERECTOMY: The patient's right carotid endarterectomy site is widely patent. He has no significant stenosis on the left. He is asymptomatic. He is on aspirin and is on a statin. I have ordered a follow up carotid duplex can in 2 years and also back at that time. Nose to call sooner if he has problems.   Return in about 2 years (around 06/05/2016).   Deitra Mayo [facility]: [phone]

## 2014-06-22 ENCOUNTER — Other Ambulatory Visit: Payer: Self-pay | Admitting: Internal Medicine

## 2014-06-25 ENCOUNTER — Encounter: Payer: Self-pay | Admitting: Cardiology

## 2014-06-25 ENCOUNTER — Ambulatory Visit (INDEPENDENT_AMBULATORY_CARE_PROVIDER_SITE_OTHER): Payer: Medicare PPO | Admitting: Cardiology

## 2014-06-25 VITALS — BP 114/70 | HR 61 | Ht 71.0 in | Wt 199.0 lb

## 2014-06-25 DIAGNOSIS — I2510 Atherosclerotic heart disease of native coronary artery without angina pectoris: Secondary | ICD-10-CM | POA: Diagnosis not present

## 2014-06-25 DIAGNOSIS — I10 Essential (primary) hypertension: Secondary | ICD-10-CM | POA: Diagnosis not present

## 2014-06-25 NOTE — Progress Notes (Signed)
HPI The patient presents for followup of CAD/CABG. He continues to abstain from alcohol.  He is smoking cigarettes. He denies chest pressure or any of the arm discomfort that was his previous angina. He has no new shortness of breath. He does not describe palpitations, presyncope or syncope. He is not having PND or orthopnea. He does have some limitation from a chronic leg weakness originating from chronic orthopedic problems and he walks with a walker.   Allergies  Allergen Reactions  . Sertraline Hcl     REACTION: heart palpitation     Current Outpatient Prescriptions  Medication Sig Dispense Refill  . amLODipine (NORVASC) 10 MG tablet TAKE 1 TABLET BY MOUTH DAILY 90 tablet 2  . aspirin 81 MG tablet Take 81 mg by mouth daily.    . Cholecalciferol (VITAMIN D3) 1000 UNITS CAPS Take 1 capsule by mouth daily. Takes 1 2000 unit capsule daily.    Marland Kitchen LORazepam (ATIVAN) 0.5 MG tablet TAKE 1 TABLET BY MOUTH TWICE DAILY AS NEEDED FOR ANXIETY 60 tablet 3  . metoprolol (LOPRESSOR) 50 MG tablet Take 1 tablet (50 mg total) by mouth 2 (two) times daily. 60 tablet 11  . nitroGLYCERIN (NITROSTAT) 0.4 MG SL tablet Place 1 tablet (0.4 mg total) under the tongue every 5 (five) minutes as needed for chest pain. 90 tablet 3  . omeprazole (PRILOSEC) 40 MG capsule TAKE 1 CAPSULE BY MOUTH EVERY DAY 90 capsule 1  . pravastatin (PRAVACHOL) 40 MG tablet Take 1 tablet (40 mg total) by mouth daily. 90 tablet 1   No current facility-administered medications for this visit.    Past Medical History  Diagnosis Date  . Carotid arterial disease     s/p RCEA 11/2008 (Note: hx of negative cardiac nuc 03/2009)  . Hypertension   . Colonic polyp     cscopes 08/18/05 tics,polyps.Marland Kitchenall hyperplastic  . GERD (gastroesophageal reflux disease)     Barrets Esop. (per bx 08/2005) and HH f/u Dr.Schooler  . Anal warts   . Anal fissure   . Hemorrhoids   . 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  Panic attack   . Osteoarthritis   . Depression   . H/O ETOH abuse     Former. Quit ~2012  . BPH (benign prostatic hyperplasia)     s/p surgery  . Anxiety and depression 02/14/2009    Qualifier: Diagnosis of  By: Larose Kells MD, [place]     Past Surgical History  Procedure Laterality Date  . Right carotid endartectomy with dacron patch angioplasty  11/2008  . Appendectomy    . Knee surgery    . Foot surgery      fx  . Total hip arthroplasty  2005    R due to OA  . Skin tags excised    . Transurethral resection of prostate    . Cataract extraction      wears contact lens L, good vision  . Coronary artery bypass graft  03/23/2011    Procedure: CORONARY ARTERY BYPASS GRAFTING (CABG);  Surgeon: Melrose Nakayama, MD;  Location: [facility];  Service: Open Heart Surgery;  Laterality: N/A;  CABG x four using bilateral greater saphenous vein, harvested endoscopically  . Left heart catheterization with coronary angiogram N/A 03/19/2011    Procedure: LEFT HEART CATHETERIZATION WITH CORONARY ANGIOGRAM;  Surgeon: Thayer Headings, MD;  Location: [facility] CATH LAB;  Service: Cardiovascular;  Laterality: N/A;    ROS:  As stated in the HPI and negative for all other systems.  PHYSICAL EXAM BP 114/70 mmHg  Pulse 61  Ht 5\' 11"  (1.803 m)  Wt 199 lb (90.266 kg)  BMI 27.77 kg/m2 GENERAL:  Chronically ill appearing but in no acute distress NECK:  No jugular venous distention, waveform within normal limits, carotid upstroke brisk and symmetric, no bruits, no thyromegaly, CEA scar LUNGS:  Clear to auscultation bilaterally CHEST:  Well healed sternotomy scar. HEART:  PMI not displaced or sustained,S1 and S2 within normal limits, no S3, no S4, no clicks, no rubs, no murmurs ABD:  Flat, positive bowel sounds normal in frequency in pitch, no bruits, no rebound, no guarding, no midline pulsatile mass, no hepatomegaly, no splenomegaly EXT:  2 plus pulses throughout, no edema, no cyanosis no clubbing   EKG:  Sinus rhythm,  rate 61, axis within normal limits, nonspecific ST-T wave changes, early transition in lead. 06/25/2014  ASSESSMENT AND PLAN   CAD (coronary artery disease) - He has no new symptoms.he is tolerating the meds as listed.  He wants to reduce his meds and he can stop his Imdur.   HYPERTENSION -  The blood pressure is at target. No change in medications is indicated. We will continue with therapeutic lifestyle changes (TLC).  Hyperlipidemia -  His LDL was 66 last year.  He will require a lipid profile when he sees Dr. Larose Kells in Sept.   Tobacco - Now smoking again.  I have asked him to stop.

## 2014-06-25 NOTE — Patient Instructions (Signed)
Your physician wants you to follow-up in: 1 Year You will receive a reminder letter in the mail two months in advance. If you don't receive a letter, please call our office to schedule the follow-up appointment.  Your physician has recommended you make the following change in your medication: STOP Isosorbide

## 2014-06-28 ENCOUNTER — Other Ambulatory Visit: Payer: Self-pay

## 2014-06-28 MED ORDER — METOPROLOL TARTRATE 50 MG PO TABS
50.0000 mg | ORAL_TABLET | Freq: Two times a day (BID) | ORAL | Status: DC
Start: 2014-06-28 — End: 2015-08-27

## 2014-08-03 ENCOUNTER — Other Ambulatory Visit: Payer: Self-pay | Admitting: Internal Medicine

## 2014-08-25 ENCOUNTER — Other Ambulatory Visit: Payer: Self-pay | Admitting: Internal Medicine

## 2014-08-27 NOTE — Telephone Encounter (Signed)
Rx printed, awaiting MD signature.  

## 2014-08-27 NOTE — Telephone Encounter (Signed)
Pt is requesting refill on Lorazepam.  Last OV: 02/23/2014 Last Fill: 04/10/2014 #[id number] UDS: 08/19/2012 Low risk  Please advise.

## 2014-08-27 NOTE — Telephone Encounter (Signed)
Rx faxed to [facility] pharmacy.  

## 2014-08-27 NOTE — Telephone Encounter (Signed)
Ok 60 , no Rf Has an appointment for next month

## 2014-08-29 ENCOUNTER — Telehealth: Payer: Self-pay | Admitting: Internal Medicine

## 2014-08-29 NOTE — Telephone Encounter (Signed)
pre visit letter mailed 08/28/14 °

## 2014-09-14 ENCOUNTER — Telehealth: Payer: Self-pay | Admitting: *Deleted

## 2014-09-14 ENCOUNTER — Encounter: Payer: Self-pay | Admitting: *Deleted

## 2014-09-14 NOTE — Telephone Encounter (Signed)
Pre-Visit Call completed with patient and chart updated.   Pre-Visit Info documented in Specialty Comments under SnapShot.    

## 2014-09-18 ENCOUNTER — Ambulatory Visit (INDEPENDENT_AMBULATORY_CARE_PROVIDER_SITE_OTHER): Payer: Medicare PPO | Admitting: Internal Medicine

## 2014-09-18 ENCOUNTER — Encounter: Payer: Self-pay | Admitting: Internal Medicine

## 2014-09-18 VITALS — BP 124/72 | HR 56 | Temp 98.5°F | Ht 71.0 in | Wt 200.2 lb

## 2014-09-18 DIAGNOSIS — Z23 Encounter for immunization: Secondary | ICD-10-CM | POA: Diagnosis not present

## 2014-09-18 DIAGNOSIS — Z09 Encounter for follow-up examination after completed treatment for conditions other than malignant neoplasm: Secondary | ICD-10-CM

## 2014-09-18 DIAGNOSIS — R7309 Other abnormal glucose: Secondary | ICD-10-CM

## 2014-09-18 DIAGNOSIS — I25709 Atherosclerosis of coronary artery bypass graft(s), unspecified, with unspecified angina pectoris: Secondary | ICD-10-CM

## 2014-09-18 DIAGNOSIS — Z0000 Encounter for general adult medical examination without abnormal findings: Secondary | ICD-10-CM | POA: Diagnosis not present

## 2014-09-18 DIAGNOSIS — E785 Hyperlipidemia, unspecified: Secondary | ICD-10-CM

## 2014-09-18 DIAGNOSIS — R7303 Prediabetes: Secondary | ICD-10-CM

## 2014-09-18 LAB — LIPID PANEL
CHOLESTEROL: 100 mg/dL (ref 0–200)
HDL: 37.1 mg/dL — ABNORMAL LOW (ref >39.00)
LDL Cholesterol: 46 mg/dL (ref 0–99)
NonHDL: 62.65
Total CHOL/HDL Ratio: 3
Triglycerides: 84 mg/dL (ref 0.0–149.0)
VLDL: 16.8 mg/dL (ref 0.0–40.0)

## 2014-09-18 LAB — COMPREHENSIVE METABOLIC PANEL
ALBUMIN: 3.7 g/dL (ref 3.5–5.2)
ALK PHOS: 78 U/L (ref 39–117)
ALT: 12 U/L (ref 0–53)
AST: 15 U/L (ref 0–37)
BUN: 13 mg/dL (ref 6–23)
CALCIUM: 9 mg/dL (ref 8.4–10.5)
CO2: 26 mEq/L (ref 19–32)
CREATININE: 1.13 mg/dL (ref 0.40–1.50)
Chloride: 111 mEq/L (ref 96–112)
GFR: 67.75 mL/min (ref >60.00)
Glucose, Bld: 84 mg/dL (ref 70–99)
Potassium: 4.3 mEq/L (ref 3.5–5.1)
SODIUM: 143 meq/L (ref 135–145)
TOTAL PROTEIN: 6.7 g/dL (ref 6.0–8.3)
Total Bilirubin: 0.5 mg/dL (ref 0.2–1.2)

## 2014-09-18 LAB — HEMOGLOBIN A1C: Hgb A1c MFr Bld: 5.6 % (ref 4.6–6.5)

## 2014-09-18 LAB — CBC WITH DIFFERENTIAL/PLATELET
BASOS PCT: 0.4 % (ref 0.0–3.0)
Basophils Absolute: 0 10*3/uL (ref 0.0–0.1)
EOS PCT: 2.5 % (ref 0.0–5.0)
Eosinophils Absolute: 0.3 10*3/uL (ref 0.0–0.7)
HCT: 43.2 % (ref 39.0–52.0)
HEMOGLOBIN: 14.2 g/dL (ref 13.0–17.0)
LYMPHS PCT: 25.9 % (ref 12.0–46.0)
Lymphs Abs: 2.6 10*3/uL (ref 0.7–4.0)
MCHC: 33 g/dL (ref 30.0–36.0)
MCV: 94.5 fl (ref 78.0–100.0)
MONOS PCT: 8.2 % (ref 3.0–12.0)
Monocytes Absolute: 0.8 10*3/uL (ref 0.1–1.0)
Neutro Abs: 6.3 10*3/uL (ref 1.4–7.7)
Neutrophils Relative %: 63 % (ref 43.0–77.0)
Platelets: 186 10*3/uL (ref 150.0–400.0)
RBC: 4.57 Mil/uL (ref 4.22–5.81)
RDW: 14.3 % (ref 11.5–15.5)
WBC: 10 10*3/uL (ref 4.0–10.5)

## 2014-09-18 LAB — TSH: TSH: 1.3 u[IU]/mL (ref 0.35–4.50)

## 2014-09-18 NOTE — Assessment & Plan Note (Signed)
Carotid artery disease: Last visit with vascular surgery 05/2014, felt to be stable, next carotid ultrasound 2 years CAD: Last visit to cardiology 06/2014, they discontinued Imdur and  recommend to continue aspirin and statins  DJD, gait disturbance: See previous visit, has seen multiple MDs, gait disturbance and inability to walk believed to be mostly due to back-hip pain Prediabetes: Check A1c Anxiety: On Ativan, check a UDS High cholesterol: Continue Pravachol 40 mg, check labs including a TSH Hypertension: Well controlled on amlodipine   Tobacco abuse: Counseled Next visit in 6 months

## 2014-09-18 NOTE — Patient Instructions (Addendum)
Get your blood work before you leave    Please consider visit these websites for more information:  www.begintheconversation.org  Theconversationproject.org  Next visit in 6 months, routine visit,  15 minutes, not fasting    Fall Prevention and Home Safety Falls cause injuries and can affect all age groups. It is possible to use preventive measures to significantly decrease the likelihood of falls. There are many simple measures which can make your home safer and prevent falls. OUTDOORS  Repair cracks and edges of walkways and driveways.  Remove high doorway thresholds.  Trim shrubbery on the main path into your home.  Have good outside lighting.  Clear walkways of tools, rocks, debris, and clutter.  Check that handrails are not broken and are securely fastened. Both sides of steps should have handrails.  Have leaves, snow, and ice cleared regularly.  Use sand or salt on walkways during winter months.  In the garage, clean up grease or oil spills. BATHROOM  Install night lights.  Install grab bars by the toilet and in the tub and shower.  Use non-skid mats or decals in the tub or shower.  Place a plastic non-slip stool in the shower to sit on, if needed.  Keep floors dry and clean up all water on the floor immediately.  Remove soap buildup in the tub or shower on a regular basis.  Secure bath mats with non-slip, double-sided rug tape.  Remove throw rugs and tripping hazards from the floors. BEDROOMS  Install night lights.  Make sure a bedside light is easy to reach.  Do not use oversized bedding.  Keep a telephone by your bedside.  Have a firm chair with side arms to use for getting dressed.  Remove throw rugs and tripping hazards from the floor. KITCHEN  Keep handles on pots and pans turned toward the center of the stove. Use back burners when possible.  Clean up spills quickly and allow time for drying.  Avoid walking on wet floors.  Avoid hot  utensils and knives.  Position shelves so they are not too high or low.  Place commonly used objects within easy reach.  If necessary, use a sturdy step stool with a grab bar when reaching.  Keep electrical cables out of the way.  Do not use floor polish or wax that makes floors slippery. If you must use wax, use non-skid floor wax.  Remove throw rugs and tripping hazards from the floor. STAIRWAYS  Never leave objects on stairs.  Place handrails on both sides of stairways and use them. Fix any loose handrails. Make sure handrails on both sides of the stairways are as long as the stairs.  Check carpeting to make sure it is firmly attached along stairs. Make repairs to worn or loose carpet promptly.  Avoid placing throw rugs at the top or bottom of stairways, or properly secure the rug with carpet tape to prevent slippage. Get rid of throw rugs, if possible.  Have an electrician put in a light switch at the top and bottom of the stairs. OTHER FALL PREVENTION TIPS  Wear low-heel or rubber-soled shoes that are supportive and fit well. Wear closed toe shoes.  When using a stepladder, make sure it is fully opened and both spreaders are firmly locked. Do not climb a closed stepladder.  Add color or contrast paint or tape to grab bars and handrails in your home. Place contrasting color strips on first and last steps.  Learn and use mobility aids as needed. Install  an electrical emergency response system.  Turn on lights to avoid dark areas. Replace light bulbs that burn out immediately. Get light switches that glow.  Arrange furniture to create clear pathways. Keep furniture in the same place.  Firmly attach carpet with non-skid or double-sided tape.  Eliminate uneven floor surfaces.  Select a carpet pattern that does not visually hide the edge of steps.  Be aware of all pets. OTHER HOME SAFETY TIPS  Set the water temperature for 120 F (48.8 C).  Keep emergency numbers on  or near the telephone.  Keep smoke detectors on every level of the home and near sleeping areas. Document Released: 12/19/2001 Document Revised: 06/30/2011 Document Reviewed: 03/20/2011 [facility] Patient Information 2015 [place], [place]. This information is not intended to replace advice given to you by your health care provider. Make sure you discuss any questions you have with your health care provider.   Preventive Care for Adults Ages 36 and over  Blood pressure check.** / Every 1 to 2 years.  Lipid and cholesterol check.**/ Every 5 years beginning at age 73.  Lung cancer screening. / Every year if you are aged 7-80 years and have a 30-pack-year history of smoking and currently smoke or have quit within the past 15 years. Yearly screening is stopped once you have quit smoking for at least 15 years or develop a health problem that would prevent you from having lung cancer treatment.  Fecal occult blood test (FOBT) of stool. / Every year beginning at age 11 and continuing until age 48. You may not have to do this test if you get a colonoscopy every 10 years.  Flexible sigmoidoscopy** or colonoscopy.** / Every 5 years for a flexible sigmoidoscopy or every 10 years for a colonoscopy beginning at age 50 and continuing until age 36.  Hepatitis C blood test.** / For all people born from [birth year] through 1963 and any individual with known risks for hepatitis C.  Abdominal aortic aneurysm (AAA) screening.** / A one-time screening for ages 70 to 84 years who are current or former smokers.  Skin self-exam. / Monthly.  Influenza vaccine. / Every year.  Tetanus, diphtheria, and acellular pertussis (Tdap/Td) vaccine.** / 1 dose of Td every 10 years.  Varicella vaccine.** / Consult your health care provider.  Zoster vaccine.** / 1 dose for adults aged 26 years or older.  Pneumococcal 13-valent conjugate (PCV13) vaccine.** / Consult your health care provider.  Pneumococcal polysaccharide (PPSV23)  vaccine.** / 1 dose for all adults aged 45 years and older.  Meningococcal vaccine.** / Consult your health care provider.  Hepatitis A vaccine.** / Consult your health care provider.  Hepatitis B vaccine.** / Consult your health care provider.  Haemophilus influenzae type b (Hib) vaccine.** / Consult your health care provider. **Family history and personal history of risk and conditions may change your health care provider's recommendations. Document Released: 02/24/2001 Document Revised: 01/03/2013 Document Reviewed: 05/26/2010 [facility] Patient Information 2015 [place], [place]. This information is not intended to replace advice given to you by your health care provider. Make sure you discuss any questions you have with your health care provider.

## 2014-09-18 NOTE — Progress Notes (Signed)
Subjective:    Patient ID: [name], male    DOB: 1940-11-21, 74 y.o.   MRN: [id number]  DOS:  09/18/2014 Type of visit - description :  Here for Medicare AWV:  1. Risk factors based on Past M, S, F history: reviewed 2. Physical Activities:  sedentary 3. Depression/mood: neg screening  4. Hearing:  No problems noted or reported  5. ADL's: needs help dressing sometimes, transferring difficult but still able to do, not driving  6. Fall Risk: + recent falls, no major injuries: Has a walker, cane, transport chair. Prevention discussed , see AVS 7. home Safety: does feel safe at home  8. Height, weight, & visual acuity: see VS, sees eye doctor regulalrly 9. Counseling: provided 10. Labs ordered based on risk factors: if needed  11. Referral Coordination: if needed 12. Care Plan, see assessment and plan , written personalized plan provided , see AVS 13. Cognitive Assessment: motor skills and cognition appropriate for age 74. Care team updated 15. End-of-life care discussed , has no HC POA, see AVS  In addition, today we discussed the following: Hypertension: Good compliance with medication, no apparent side effects. BP today is very good High cholesterol, on statins, due for an FLP Anxiety: Well controlled with Ativan, takes one dose qd  on average CAD, carotid disease: note from cardiology and vascular surgery reviewed   Review of Systems Constitutional: No fever. No chills. No unexplained wt changes. No unusual sweats  HEENT: No dental problems, no ear discharge, no facial swelling, no voice changes. Occasional eye discharge, already discuss with ophthalmology; no eye  redness , no  intolerance to light   Respiratory: No wheezing , no  difficulty breathing. No cough , no mucus production  Cardiovascular: No CP, no leg swelling , no  Palpitations. No claudication  GI: no nausea, no vomiting, no diarrhea , no  abdominal pain.  No blood in the stools. No dysphagia, no  odynophagia    Endocrine: No polyphagia, no polyuria , no polydipsia  GU: No dysuria, gross hematuria, difficulty urinating. No urinary urgency,  Mild urinary frequency, no other urinary symptoms  Musculoskeletal: No joint swellings or unusual aches or pains  Skin: No change in the color of the skin, palor , no  Rash  Allergic, immunologic: No environmental allergies , no  food allergies  Neurological: No dizziness no  syncope. No headaches. No diplopia, no slurred, no slurred speech, no motor deficits, no facial  Numbness  Hematological: No enlarged lymph nodes, no easy bruising , no unusual bleedings  Psychiatry: No suicidal ideas, no hallucinations, no beavior problems, no confusion.  No unusual/severe anxiety, no depression   Past Medical History  Diagnosis Date  . Carotid arterial disease     s/p RCEA 11/2008 (Note: hx of negative cardiac nuc 03/2009)  . Hypertension   . Colonic polyp     cscopes 08/18/05 tics,polyps.Marland Kitchenall hyperplastic  . GERD (gastroesophageal reflux disease)     Barrets Esop. (per bx 08/2005) and HH f/u Dr.Schooler  . Anal warts   . Anal fissure   . Hemorrhoids   . 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 Panic attack   . Osteoarthritis   . Depression   . H/O ETOH abuse     Former. Quit ~2012  . BPH (benign prostatic hyperplasia)     s/p surgery  . Anxiety and depression 02/14/2009    Qualifier: Diagnosis of  By:  Bernhard Koskinen MD, [place]     Past Surgical History  Procedure Laterality Date  . Right carotid endartectomy with dacron patch angioplasty  11/2008  . Appendectomy    . Knee surgery    . Foot surgery      fx  . Total hip arthroplasty  2005    R due to OA  . Skin tags excised    . Transurethral resection of prostate    . Cataract extraction      wears contact lens L, good vision  . Coronary artery bypass graft  03/23/2011    Procedure: CORONARY ARTERY BYPASS GRAFTING (CABG);  Surgeon: Melrose Nakayama, MD;   Location: [facility];  Service: Open Heart Surgery;  Laterality: N/A;  CABG x four using bilateral greater saphenous vein, harvested endoscopically  . Left heart catheterization with coronary angiogram N/A 03/19/2011    Procedure: LEFT HEART CATHETERIZATION WITH CORONARY ANGIOGRAM;  Surgeon: Thayer Headings, MD;  Location: [facility] CATH LAB;  Service: Cardiovascular;  Laterality: N/A;    Social History   Social History  . Marital Status: Married    Spouse Name: N/A  . Number of Children: 2  . Years of Education: N/A   Occupational History  . retired     Social History Main Topics  . Smoking status: Current Some Day Smoker -- 1.00 packs/day for 52 years    Types: Cigarettes  . Smokeless tobacco: Never Used     Comment: < 1 ppd   . Alcohol Use: No     Comment: currently not drinking, former abuse   . Drug Use: No  . Sexual Activity: No   Other Topics Concern  . Not on file   Social History Narrative   Lost mom 12-2013   lives by himself, 2 children ([place], [place]), has a girlfriend [name], comes with him at every visit, she visits the pt  daily          Family History  Problem Relation Age of Onset  . Lung cancer Father     mets to brain- deceased  . Arthritis Mother   . Hypertension Mother   . Coronary artery disease Neg Hx   . Colon cancer Neg Hx   . Prostate cancer Neg Hx   . Diabetes Paternal Aunt   . Stroke Paternal Grandfather           Medication List       This list is accurate as of: 09/18/14  1:00 PM.  Always use your most recent med list.               amLODipine 10 MG tablet  Commonly known as:  NORVASC  TAKE 1 TABLET BY MOUTH DAILY     aspirin 81 MG tablet  Take 81 mg by mouth daily.     LORazepam 0.5 MG tablet  Commonly known as:  ATIVAN  Take 1 tablet (0.5 mg total) by mouth 2 (two) times daily as needed for anxiety.     metoprolol 50 MG tablet  Commonly known as:  LOPRESSOR  Take 1 tablet (50 mg total) by mouth 2 (two) times daily.      nitroGLYCERIN 0.4 MG SL tablet  Commonly known as:  NITROSTAT  Place 1 tablet (0.4 mg total) under the tongue every 5 (five) minutes as needed for chest pain.     omeprazole 40 MG capsule  Commonly known as:  PRILOSEC  Take 1 capsule (40 mg total) by mouth daily.  pravastatin 40 MG tablet  Commonly known as:  PRAVACHOL  Take 1 tablet (40 mg total) by mouth daily.     Vitamin D3 1000 UNITS Caps  Take 1 capsule by mouth daily. Takes 1 2000 unit capsule daily.     vitamin E 400 UNIT capsule  Take 400 Units by mouth daily.           Objective:   Physical Exam BP 124/72 mmHg  Pulse 56  Temp(Src) 98.5 F (36.9 C) (Oral)  Ht 5\' 11"  (1.803 m)  Wt 200 lb 4 oz (90.833 kg)  BMI 27.94 kg/m2  SpO2 96% General:   Well developed, well nourished . NAD.  HEENT:  Normocephalic . Face symmetric, atraumatic Lungs:  CTA B Normal respiratory effort, no intercostal retractions, no accessory muscle use. Heart: RRR,  no murmur.  no pretibial edema bilaterally  Abdomen:  Not distended, soft, non-tender. No rebound or rigidity. No bruit  Skin: Not pale. Not jaundice Neurologic:  alert & oriented X3.  Speech normal, gait not tested, sitting in a wheelchair  Psych--  Cognition and judgment appear intact.  Cooperative with normal attention span and concentration.  Behavior appropriate. No anxious or depressed appearing.    Assessment & Plan:    A/P Carotid artery disease: Last visit with vascular surgery 05/2014, felt to be stable, next carotid ultrasound 2 years CAD: Last visit to cardiology 06/2014, they discontinued Imdur and  recommend to continue aspirin and statins  DJD, gait disturbance: See previous visit, has seen multiple MDs, gait disturbance and inability to walk believed to be mostly due to back-hip pain Prediabetes: Check A1c Anxiety: On Ativan, check a UDS High cholesterol: Continue Pravachol 40 mg, check labs including a TSH Hypertension: Well controlled on  amlodipine   Tobacco abuse: Counseled Next visit in 6 months

## 2014-09-18 NOTE — Progress Notes (Signed)
Pre visit review using our clinic review tool, if applicable. No additional management support is needed unless otherwise documented below in the visit note. 

## 2014-09-18 NOTE — Assessment & Plan Note (Addendum)
Td ~ 2009 ; pneumonia shot-2012 ; prevnar -- 2015;  zostavax  2015 per pt; Flu shot today Cscope 2007--- 3 hyperplastic polyps Dr Michail Sermon  cscope 2014 , + polyps, next per GI Cancer screening: PSA has been consistently normal, DRE 2015 normal, reassess next year counseled about diet and exercise

## 2014-09-28 ENCOUNTER — Telehealth: Payer: Self-pay

## 2014-09-28 NOTE — Telephone Encounter (Signed)
UDS: 09/18/2014  Positive for Lorazepam   Low risk per Dr. Larose Kells 09/28/2014

## 2014-11-19 ENCOUNTER — Other Ambulatory Visit: Payer: Self-pay | Admitting: Gastroenterology

## 2014-11-19 LAB — HM COLONOSCOPY

## 2014-12-03 ENCOUNTER — Encounter: Payer: Self-pay | Admitting: Internal Medicine

## 2014-12-29 ENCOUNTER — Emergency Department (HOSPITAL_COMMUNITY): Payer: Medicare PPO

## 2014-12-29 ENCOUNTER — Inpatient Hospital Stay (HOSPITAL_COMMUNITY)
Admission: EM | Admit: 2014-12-29 | Discharge: 2015-01-02 | DRG: 041 | Disposition: A | Discharge status: Skilled Nursing Facility | Payer: Medicare PPO | Attending: Neurology | Admitting: Neurology

## 2014-12-29 ENCOUNTER — Inpatient Hospital Stay (HOSPITAL_COMMUNITY): Payer: Medicare PPO

## 2014-12-29 DIAGNOSIS — I63411 Cerebral infarction due to embolism of right middle cerebral artery: Principal | ICD-10-CM | POA: Diagnosis present

## 2014-12-29 DIAGNOSIS — I69359 Hemiplegia and hemiparesis following cerebral infarction affecting unspecified side: Secondary | ICD-10-CM | POA: Diagnosis not present

## 2014-12-29 DIAGNOSIS — I2510 Atherosclerotic heart disease of native coronary artery without angina pectoris: Secondary | ICD-10-CM | POA: Diagnosis present

## 2014-12-29 DIAGNOSIS — G8194 Hemiplegia, unspecified affecting left nondominant side: Secondary | ICD-10-CM | POA: Diagnosis present

## 2014-12-29 DIAGNOSIS — K219 Gastro-esophageal reflux disease without esophagitis: Secondary | ICD-10-CM | POA: Diagnosis present

## 2014-12-29 DIAGNOSIS — Z7982 Long term (current) use of aspirin: Secondary | ICD-10-CM

## 2014-12-29 DIAGNOSIS — I6789 Other cerebrovascular disease: Secondary | ICD-10-CM | POA: Diagnosis not present

## 2014-12-29 DIAGNOSIS — I63511 Cerebral infarction due to unspecified occlusion or stenosis of right middle cerebral artery: Secondary | ICD-10-CM | POA: Diagnosis not present

## 2014-12-29 DIAGNOSIS — Z888 Allergy status to other drugs, medicaments and biological substances status: Secondary | ICD-10-CM

## 2014-12-29 DIAGNOSIS — I10 Essential (primary) hypertension: Secondary | ICD-10-CM | POA: Diagnosis present

## 2014-12-29 DIAGNOSIS — Z79899 Other long term (current) drug therapy: Secondary | ICD-10-CM

## 2014-12-29 DIAGNOSIS — Z951 Presence of aortocoronary bypass graft: Secondary | ICD-10-CM | POA: Diagnosis not present

## 2014-12-29 DIAGNOSIS — R29711 NIHSS score 11: Secondary | ICD-10-CM | POA: Diagnosis present

## 2014-12-29 DIAGNOSIS — I340 Nonrheumatic mitral (valve) insufficiency: Secondary | ICD-10-CM | POA: Diagnosis not present

## 2014-12-29 DIAGNOSIS — I69398 Other sequelae of cerebral infarction: Secondary | ICD-10-CM | POA: Insufficient documentation

## 2014-12-29 DIAGNOSIS — F1011 Alcohol abuse, in remission: Secondary | ICD-10-CM | POA: Insufficient documentation

## 2014-12-29 DIAGNOSIS — F4323 Adjustment disorder with mixed anxiety and depressed mood: Secondary | ICD-10-CM | POA: Diagnosis present

## 2014-12-29 DIAGNOSIS — I69354 Hemiplegia and hemiparesis following cerebral infarction affecting left non-dominant side: Secondary | ICD-10-CM | POA: Insufficient documentation

## 2014-12-29 DIAGNOSIS — N400 Enlarged prostate without lower urinary tract symptoms: Secondary | ICD-10-CM | POA: Diagnosis present

## 2014-12-29 DIAGNOSIS — F410 Panic disorder [episodic paroxysmal anxiety] without agoraphobia: Secondary | ICD-10-CM | POA: Diagnosis present

## 2014-12-29 DIAGNOSIS — F17210 Nicotine dependence, cigarettes, uncomplicated: Secondary | ICD-10-CM | POA: Diagnosis present

## 2014-12-29 DIAGNOSIS — I639 Cerebral infarction, unspecified: Secondary | ICD-10-CM | POA: Diagnosis not present

## 2014-12-29 DIAGNOSIS — Z8249 Family history of ischemic heart disease and other diseases of the circulatory system: Secondary | ICD-10-CM | POA: Diagnosis not present

## 2014-12-29 DIAGNOSIS — R0682 Tachypnea, not elsewhere classified: Secondary | ICD-10-CM | POA: Diagnosis present

## 2014-12-29 DIAGNOSIS — Z85828 Personal history of other malignant neoplasm of skin: Secondary | ICD-10-CM

## 2014-12-29 DIAGNOSIS — E785 Hyperlipidemia, unspecified: Secondary | ICD-10-CM | POA: Diagnosis present

## 2014-12-29 DIAGNOSIS — R471 Dysarthria and anarthria: Secondary | ICD-10-CM | POA: Diagnosis present

## 2014-12-29 DIAGNOSIS — Z96641 Presence of right artificial hip joint: Secondary | ICD-10-CM | POA: Diagnosis present

## 2014-12-29 LAB — DIFFERENTIAL
BASOS PCT: 0 %
Basophils Absolute: 0 10*3/uL (ref 0.0–0.1)
EOS ABS: 0.2 10*3/uL (ref 0.0–0.7)
EOS PCT: 2 %
Lymphocytes Relative: 33 %
Lymphs Abs: 3.3 10*3/uL (ref 0.7–4.0)
MONO ABS: 0.8 10*3/uL (ref 0.1–1.0)
Monocytes Relative: 7 %
NEUTROS ABS: 5.8 10*3/uL (ref 1.7–7.7)
Neutrophils Relative %: 58 %

## 2014-12-29 LAB — ETHANOL

## 2014-12-29 LAB — CBC
HCT: 45.1 % (ref 39.0–52.0)
Hemoglobin: 14.9 g/dL (ref 13.0–17.0)
MCH: 31.6 pg (ref 26.0–34.0)
MCHC: 33 g/dL (ref 30.0–36.0)
MCV: 95.6 fL (ref 78.0–100.0)
PLATELETS: 163 10*3/uL (ref 150–400)
RBC: 4.72 MIL/uL (ref 4.22–5.81)
RDW: 13.1 % (ref 11.5–15.5)
WBC: 10.1 10*3/uL (ref 4.0–10.5)

## 2014-12-29 LAB — COMPREHENSIVE METABOLIC PANEL
ALT: 16 U/L — AB (ref 17–63)
ANION GAP: 7 (ref 5–15)
AST: 21 U/L (ref 15–41)
Albumin: 3.5 g/dL (ref 3.5–5.0)
Alkaline Phosphatase: 89 U/L (ref 38–126)
BUN: 14 mg/dL (ref 6–20)
CHLORIDE: 110 mmol/L (ref 101–111)
CO2: 25 mmol/L (ref 22–32)
Calcium: 9.1 mg/dL (ref 8.9–10.3)
Creatinine, Ser: 1.26 mg/dL — ABNORMAL HIGH (ref 0.61–1.24)
GFR, EST NON AFRICAN AMERICAN: 55 mL/min — AB (ref >60)
Glucose, Bld: 95 mg/dL (ref 65–99)
POTASSIUM: 4.1 mmol/L (ref 3.5–5.1)
SODIUM: 142 mmol/L (ref 135–145)
Total Bilirubin: 0.5 mg/dL (ref 0.3–1.2)
Total Protein: 6.8 g/dL (ref 6.5–8.1)

## 2014-12-29 LAB — URINALYSIS, ROUTINE W REFLEX MICROSCOPIC
BILIRUBIN URINE: NEGATIVE
Glucose, UA: NEGATIVE mg/dL
Ketones, ur: 15 mg/dL — AB
Leukocytes, UA: NEGATIVE
Nitrite: NEGATIVE
PH: 6 (ref 5.0–8.0)
Protein, ur: NEGATIVE mg/dL
SPECIFIC GRAVITY, URINE: 1.01 (ref 1.005–1.030)

## 2014-12-29 LAB — APTT: aPTT: 31 seconds (ref 24–37)

## 2014-12-29 LAB — MRSA PCR SCREENING: MRSA BY PCR: NEGATIVE

## 2014-12-29 LAB — RAPID URINE DRUG SCREEN, HOSP PERFORMED
AMPHETAMINES: NOT DETECTED
BENZODIAZEPINES: NOT DETECTED
Barbiturates: NOT DETECTED
COCAINE: NOT DETECTED
OPIATES: NOT DETECTED
Tetrahydrocannabinol: NOT DETECTED

## 2014-12-29 LAB — I-STAT CHEM 8, ED
BUN: 17 mg/dL (ref 6–20)
CALCIUM ION: 1.05 mmol/L — AB (ref 1.13–1.30)
CHLORIDE: 109 mmol/L (ref 101–111)
Creatinine, Ser: 1.1 mg/dL (ref 0.61–1.24)
Glucose, Bld: 86 mg/dL (ref 65–99)
HCT: 47 % (ref 39.0–52.0)
HEMOGLOBIN: 16 g/dL (ref 13.0–17.0)
POTASSIUM: 4.2 mmol/L (ref 3.5–5.1)
SODIUM: 143 mmol/L (ref 135–145)
TCO2: 23 mmol/L (ref 0–100)

## 2014-12-29 LAB — I-STAT TROPONIN, ED: TROPONIN I, POC: 0.02 ng/mL (ref 0.00–0.08)

## 2014-12-29 LAB — URINE MICROSCOPIC-ADD ON: BACTERIA UA: NONE SEEN

## 2014-12-29 LAB — PROTIME-INR
INR: 1.13 (ref 0.00–1.49)
PROTHROMBIN TIME: 14.7 s (ref 11.6–15.2)

## 2014-12-29 MED ORDER — ALTEPLASE (STROKE) FULL DOSE INFUSION
83.0000 mg | INTRAVENOUS | Status: AC
  Administered 2014-12-29: 83 mg via INTRAVENOUS
  Filled 2014-12-29: qty 83

## 2014-12-29 MED ORDER — LABETALOL HCL 5 MG/ML IV SOLN: 10.0000 mg | INTRAVENOUS | Status: DC | PRN

## 2014-12-29 MED ORDER — SENNOSIDES-DOCUSATE SODIUM 8.6-50 MG PO TABS: 1.0000 | ORAL_TABLET | Freq: Every evening | ORAL | Status: DC | PRN

## 2014-12-29 MED ORDER — SODIUM CHLORIDE 0.9 % IV SOLN
INTRAVENOUS | Status: DC
  Administered 2014-12-29 – 2014-12-31 (×4): via INTRAVENOUS

## 2014-12-29 MED ORDER — ACETAMINOPHEN 325 MG PO TABS
650.0000 mg | ORAL_TABLET | ORAL | Status: DC | PRN
  Administered 2015-01-02: 650 mg via ORAL
  Filled 2014-12-29: qty 2

## 2014-12-29 MED ORDER — IOHEXOL 350 MG/ML SOLN
80.0000 mL | Freq: Once | INTRAVENOUS | Status: AC | PRN
  Administered 2014-12-29: 80 mL via INTRAVENOUS

## 2014-12-29 MED ORDER — STROKE: EARLY STAGES OF RECOVERY BOOK
Freq: Once | Status: AC
  Administered 2014-12-29: 14:00:00
  Filled 2014-12-29: qty 1

## 2014-12-29 MED ORDER — PANTOPRAZOLE SODIUM 40 MG IV SOLR
40.0000 mg | Freq: Every day | INTRAVENOUS | Status: DC
  Administered 2014-12-29 – 2015-01-01 (×4): 40 mg via INTRAVENOUS
  Filled 2014-12-29 (×4): qty 40

## 2014-12-29 MED ORDER — ACETAMINOPHEN 650 MG RE SUPP: 650.0000 mg | RECTAL | Status: DC | PRN

## 2014-12-29 NOTE — Progress Notes (Signed)
Notified Dr. Armida Sans that patient was unable to move left arm on his own, which he was able to do post-tPA.  No orders received at this time.  Will continue to monitor patient.

## 2014-12-29 NOTE — ED Notes (Signed)
Pt has LSN 1030. Pt talked to neigbor at 1145 with slurred speech. Pt presents with LSW, slurred speech, right sided gaze.

## 2014-12-29 NOTE — ED Provider Notes (Signed)
CSN: [id number]     Arrival date & time 12/29/14  1233 History   First MD Initiated Contact with Patient 12/29/14 1241     Chief Complaint  Patient presents with  . Code Stroke     (Consider location/radiation/quality/duration/timing/severity/associated sxs/prior Treatment) Patient is a 74 y.o. male presenting with Acute Neurological Problem. The history is provided by the patient.  Cerebrovascular Accident This is a new problem. The current episode started 3 to 5 hours ago. The problem occurs constantly. The problem has not changed since onset.Pertinent negatives include no chest pain, no abdominal pain, no headaches and no shortness of breath. Nothing aggravates the symptoms. Nothing relieves the symptoms. He has tried nothing for the symptoms. The treatment provided no relief.    74 yo M with a chief complaint of strokelike symptoms. Patient was time to his neighbor a couple hours prior to arrival was noted to have slurred speech facial droop right-sided eye deviation. Was made a code stroke on arrival. Protecting his airway with straight to the CT scanner.  Past Medical History  Diagnosis Date  . Carotid arterial disease     s/p RCEA 11/2008 (Note: hx of negative cardiac nuc 03/2009)  . Hypertension   . Colonic polyp     cscopes 08/18/05 tics,polyps.Marland Kitchenall hyperplastic  . GERD (gastroesophageal reflux disease)     Barrets Esop. (per bx 08/2005) and HH f/u Dr.Schooler  . Anal warts   . Anal fissure   . Hemorrhoids   . 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 Panic attack   . Osteoarthritis   . Depression   . H/O ETOH abuse     Former. Quit ~2012  . BPH (benign prostatic hyperplasia)     s/p surgery  . Anxiety and depression 02/14/2009    Qualifier: Diagnosis of  By: Larose Kells MD, Dana    Past Surgical History  Procedure Laterality Date  . Right carotid endartectomy with dacron patch angioplasty  11/2008  . Appendectomy    . Knee surgery    . Foot  surgery      fx  . Total hip arthroplasty  2005    R due to OA  . Skin tags excised    . Transurethral resection of prostate    . Cataract extraction      wears contact lens L, good vision  . Coronary artery bypass graft  03/23/2011    Procedure: CORONARY ARTERY BYPASS GRAFTING (CABG);  Surgeon: Melrose Nakayama, MD;  Location: [place];  Service: Open Heart Surgery;  Laterality: N/A;  CABG x four using bilateral greater saphenous vein, harvested endoscopically  . Left heart catheterization with coronary angiogram N/A 03/19/2011    Procedure: LEFT HEART CATHETERIZATION WITH CORONARY ANGIOGRAM;  Surgeon: Thayer Headings, MD;  Location: [facility] CATH LAB;  Service: Cardiovascular;  Laterality: N/A;   Family History  Problem Relation Age of Onset  . Lung cancer Father     mets to brain- deceased  . Arthritis Mother   . Hypertension Mother   . Coronary artery disease Neg Hx   . Colon cancer Neg Hx   . Prostate cancer Neg Hx   . Diabetes Paternal Aunt   . Stroke Paternal Grandfather    Social History  Substance Use Topics  . Smoking status: Current Some Day Smoker -- 1.00 packs/day for 52 years    Types: Cigarettes  . Smokeless tobacco: Never Used  Comment: < 1 ppd   . Alcohol Use: No     Comment: currently not drinking, former abuse     Review of Systems  Constitutional: Negative for fever and chills.  HENT: Negative for congestion and facial swelling.   Eyes: Negative for discharge and visual disturbance.  Respiratory: Negative for shortness of breath.   Cardiovascular: Negative for chest pain and palpitations.  Gastrointestinal: Negative for vomiting, abdominal pain and diarrhea.  Musculoskeletal: Negative for myalgias and arthralgias.  Skin: Negative for color change and rash.  Neurological: Positive for facial asymmetry and speech difficulty. Negative for tremors, syncope and headaches.  Psychiatric/Behavioral: Negative for confusion and dysphoric mood.      Allergies   Sertraline hcl  Home Medications   Prior to Admission medications   Medication Sig Start Date End Date Taking? Authorizing Provider  amLODipine (NORVASC) 10 MG tablet TAKE 1 TABLET BY MOUTH DAILY 04/04/14   Colon Branch, MD  aspirin 81 MG tablet Take 81 mg by mouth daily.    Historical Provider, MD  Cholecalciferol (VITAMIN D3) 1000 UNITS CAPS Take 1 capsule by mouth daily. Takes 1 2000 unit capsule daily.    Historical Provider, MD  LORazepam (ATIVAN) 0.5 MG tablet Take 1 tablet (0.5 mg total) by mouth 2 (two) times daily as needed for anxiety. 08/27/14   Colon Branch, MD  metoprolol (LOPRESSOR) 50 MG tablet Take 1 tablet (50 mg total) by mouth 2 (two) times daily. 06/28/14   Minus Breeding, MD  nitroGLYCERIN (NITROSTAT) 0.4 MG SL tablet Place 1 tablet (0.4 mg total) under the tongue every 5 (five) minutes as needed for chest pain. Patient not taking: Reported on 09/18/2014 12/31/11   Minus Breeding, MD  omeprazole (PRILOSEC) 40 MG capsule Take 1 capsule (40 mg total) by mouth daily. 08/03/14   Colon Branch, MD  pravastatin (PRAVACHOL) 40 MG tablet Take 1 tablet (40 mg total) by mouth daily. 06/22/14   Colon Branch, MD  vitamin E 400 UNIT capsule Take 400 Units by mouth daily.    Historical Provider, MD   BP 156/75 mmHg  Pulse 69  Temp(Src) 98 F (36.7 C) (Oral)  Resp 22  Wt 202 lb (91.627 kg)  SpO2 99% Physical Exam  Constitutional: He is oriented to person, place, and time. He appears well-developed and well-nourished.  HENT:  Head: Normocephalic and atraumatic.  Eyes: EOM are normal. Pupils are equal, round, and reactive to light.  Neck: Normal range of motion. Neck supple. No JVD present.  Cardiovascular: Normal rate and regular rhythm.  Exam reveals no gallop and no friction rub.   No murmur heard. Pulmonary/Chest: No respiratory distress. He has no wheezes.  Abdominal: He exhibits no distension. There is no rebound and no guarding.  Musculoskeletal: Normal range of motion.  Neurological:  He is alert and oriented to person, place, and time.  Right-sided gaze left-sided facial droop left-sided upper and lower extremity weakness  Skin: No rash noted. No pallor.  Psychiatric: He has a normal mood and affect. His behavior is normal.  Nursing note and vitals reviewed.   ED Course  Procedures (including critical care time) Labs Review Labs Reviewed  COMPREHENSIVE METABOLIC PANEL - Abnormal; Notable for the following:    Creatinine, Ser 1.26 (*)    ALT 16 (*)    GFR calc non Af Amer 55 (*)    All other components within normal limits  I-STAT CHEM 8, ED - Abnormal; Notable for the following:  Calcium, Ion 1.05 (*)    All other components within normal limits  ETHANOL  PROTIME-INR  APTT  CBC  DIFFERENTIAL  URINE RAPID DRUG SCREEN, HOSP PERFORMED  URINALYSIS, ROUTINE W REFLEX MICROSCOPIC (NOT AT [facility])  I-STAT TROPOININ, ED    Imaging Review Ct Head Wo Contrast  12/29/2014  CLINICAL DATA:  Code stroke, sudden onset of slurred speech and LEFT-sided weakness, RIGHT gaze preference, history carotid artery disease, hypertension, smoker EXAM: CT HEAD WITHOUT CONTRAST TECHNIQUE: Contiguous axial images were obtained from the base of the skull through the vertex without intravenous contrast. COMPARISON:  11/16/2008; correlation MR/MRA brain 11/19/2008 FINDINGS: Generalized atrophy. Diffuse dilatation of the ventricular system not significantly changed. No midline shift or mass effect. Small vessel chronic ischemic changes of deep cerebral white matter. Question tiny old deep infarcts in the cerebellar hemispheres bilaterally. Area of low attenuation identified at the lateral LEFT frontal region compatible with age-indeterminate infarction. No intracranial hemorrhage, mass lesion, or evidence of additional acute infarction. No extra-axial fluid collections. Atherosclerotic calcifications bilaterally at the carotid siphons. Bones and sinuses unremarkable. IMPRESSION: Atrophy with small  vessel chronic ischemic changes. Age-indeterminate infarct LEFT frontal region. Questionable tiny lacunar infarcts at the cerebellar hemispheres. No intracranial hemorrhage. Findings called to Dr. Armida Sans on 12/29/2014 at 1309 hour. Electronically Signed   By: Lavonia Dana M.D.   On: 12/29/2014 13:09   I have personally reviewed and evaluated these images and lab results as part of my medical decision-making.   EKG Interpretation None      MDM   Final diagnoses:  Stroke with cerebral ischemia [facility])    74 yo M with a chief complaint of strokelike symptoms. Patient was made a code stroke. Urgently to CT scan. Neurology at bedside on my evaluation.  Neuro to give TPA.   CRITICAL CARE Performed by: Cecilio Asper   Total critical care time: 32 minutes  Critical care time was exclusive of separately billable procedures and treating other patients.  Critical care was necessary to treat or prevent imminent or life-threatening deterioration.  Critical care was time spent personally by me on the following activities: development of treatment plan with patient and/or surrogate as well as nursing, discussions with consultants, evaluation of patient's response to treatment, examination of patient, obtaining history from patient or surrogate, ordering and performing treatments and interventions, ordering and review of laboratory studies, ordering and review of radiographic studies, pulse oximetry and re-evaluation of patient's condition.   Going to CT then to straight to ICU.  The patients results and plan were reviewed and discussed.   Any x-rays performed were independently reviewed by myself.   Differential diagnosis were considered with the presenting HPI.  Medications  alteplase (ACTIVASE) 1 mg/mL infusion 83 mg (83 mg Intravenous New Bag/Given 12/29/14 1321)   stroke: mapping our early stages of recovery book (not administered)  0.9 %  sodium chloride infusion (not  administered)  acetaminophen (TYLENOL) tablet 650 mg (not administered)    Or  acetaminophen (TYLENOL) suppository 650 mg (not administered)  senna-docusate (Senokot-S) tablet 1 tablet (not administered)  labetalol (NORMODYNE,TRANDATE) injection 10 mg (not administered)  pantoprazole (PROTONIX) injection 40 mg (not administered)  iohexol (OMNIPAQUE) 350 MG/ML injection 80 mL (80 mLs Intravenous Contrast Given 12/29/14 1345)    Filed Vitals:   12/29/14 1306 12/29/14 1315 12/29/14 1330 12/29/14 1345  BP: 145/75 139/71 145/83 156/75  Pulse: 74 71 66 69  Temp: 98 F (36.7 C)     TempSrc: Oral  Resp: 22 22 21 22   Weight: 202 lb (91.627 kg)     SpO2: 96% 99% 98% 99%    Final diagnoses:  Stroke with cerebral ischemia [facility])    Admission/ observation were discussed with the admitting physician, patient and/or family and they are comfortable with the plan.    Deno Etienne, DO 12/29/14 1355

## 2014-12-29 NOTE — H&P (Signed)
Stroke H/P  Referring physician: ED Reason for the consult: left hemiparesis, dysarthria, right gaze preference.   74 Y/O with a past medical history significant for HTN, CAD s/p CABG, carotid disease s/p right CEA, former alcohol abuse, brought to the ED via EMS as a code stroke due to acute onset of the above stated symptoms. Patient friend is at the bedside and stated that Mr [name] was normal when she talked to him on the phone around 10:30 today, but approximately 1 hour later he called her and she got concerned because he was slurring his words. EMS was called by the patient and they found him with with flaccid weakness left side, slurred speech, and gaze preference to the right. Upon arrival to the ED he was alert and awake with dense left hemiparesis, right gaze preference, and dysarthria. His left hemiparesis virtually resolved after returning from the CT suite but still with a high NIHSS 11. CT brain showed an area of low attenuation identified at the lateral LEFT frontal region compatible with age-indeterminate infarction. At baseline patient walks with a walker and is able to perform majority of his activities of daily living with minimal assistance  Date last known well: 12/29/14 Time last known well: 10:30 am  tPA Given: yes NIHSS: 11 MRS: 1  Past Medical History  Diagnosis Date  . Carotid arterial disease     s/p RCEA 11/2008 (Note: hx of negative cardiac nuc 03/2009)  . Hypertension   . Colonic polyp     cscopes 08/18/05 tics,polyps.Marland Kitchenall hyperplastic  . GERD (gastroesophageal reflux disease)     Barrets Esop. (per bx 08/2005) and HH f/u Dr.Schooler  . Anal warts   . Anal fissure   . Hemorrhoids   . 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 Panic attack   . Osteoarthritis   . Depression   . H/O ETOH abuse     Former. Quit ~2012  . BPH (benign prostatic hyperplasia)     s/p  surgery  . Anxiety and depression 02/14/2009    Qualifier: Diagnosis of By: Larose Kells MD, [place]     Past Surgical History  Procedure Laterality Date  . Right carotid endartectomy with dacron patch angioplasty  11/2008  . Appendectomy    . Knee surgery    . Foot surgery      fx  . Total hip arthroplasty  2005    R due to OA  . Skin tags excised    . Transurethral resection of prostate    . Cataract extraction      wears contact lens L, good vision  . Coronary artery bypass graft  03/23/2011    Procedure: CORONARY ARTERY BYPASS GRAFTING (CABG); Surgeon: Melrose Nakayama, MD; Location: [place]; Service: Open Heart Surgery; Laterality: N/A; CABG x four using bilateral greater saphenous vein, harvested endoscopically  . Left heart catheterization with coronary angiogram N/A 03/19/2011    Procedure: LEFT HEART CATHETERIZATION WITH CORONARY ANGIOGRAM; Surgeon: Thayer Headings, MD; Location: [facility] CATH LAB; Service: Cardiovascular; Laterality: N/A;    Family History  Problem Relation Age of Onset  . Lung cancer Father     mets to brain- deceased  . Arthritis Mother   . Hypertension Mother   . Coronary artery disease Neg Hx   . Colon cancer Neg Hx   . Prostate cancer Neg Hx   . Diabetes Paternal Aunt   . Stroke Paternal Grandfather  Social History:  reports that he has been smoking Cigarettes. He has a 52 pack-year smoking history. He has never used smokeless tobacco. He reports that he does not drink alcohol or use illicit drugs. Family history: no MS, epilepsy, or brain tumor Allergies:  Allergies  Allergen Reactions  . Sertraline Hcl     REACTION: heart palpitation     Medications:   I have reviewed the patient's current  medications.  ROS:  History obtained from friend, chart review and the patient  General ROS: negative for - chills, fatigue, fever, night sweats, weight gain or weight loss Psychological ROS: negative for - behavioral disorder, hallucinations, memory difficulties, mood swings or suicidal ideation Ophthalmic ROS: negative for - blurry vision, double vision, eye pain or loss of vision ENT ROS: negative for - epistaxis, nasal discharge, oral lesions, sore throat, tinnitus or vertigo Allergy and Immunology ROS: negative for - hives or itchy/watery eyes Hematological and Lymphatic ROS: negative for - bleeding problems, bruising or swollen lymph nodes Endocrine ROS: negative for - galactorrhea, hair pattern changes, polydipsia/polyuria or temperature intolerance Respiratory ROS: negative for - cough, hemoptysis, shortness of breath or wheezing Cardiovascular ROS: negative for - chest pain, dyspnea on exertion, edema or irregular heartbeat Gastrointestinal ROS: negative for - abdominal pain, diarrhea, hematemesis, nausea/vomiting or stool incontinence Genito-Urinary ROS: negative for - dysuria, hematuria, incontinence or urinary frequency/urgency Musculoskeletal ROS: negative for - joint swelling Neurological ROS: as noted in HPI Dermatological ROS: negative for rash and skin lesion changes   Physical exam:  Constitutional: well developed, pleasant male in no apparent distress. Blood pressure 139/71, pulse 71, temperature 98 F (36.7 C), temperature source Oral, resp. rate 22, weight 91.627 kg (202 lb), SpO2 99 %. Eyes: no jaundice or exophthalmos.  Head: normocephalic. Neck: supple, no bruits, no JVD. Cardiac: no murmurs. Lungs: clear. Abdomen: soft, no tender, no mass. Extremities: no edema, clubbing, or cyanosis.  Skin: no rash  Neurologic  Examination:  General: NAD Mental Status: Alert, oriented, thought content appropriate. Dysarthria without evidence of aphasia. Able to follow 3 step commands without difficulty. Cranial Nerves: II: Discs flat bilaterally; Visual fields grossly normal, pupils equal, round, reactive to light and accommodation III,IV, VI: ptosis not present, mild right gaze preference V,VII: smile asymmetric due to left face wedakness, facial light touch sensation normal bilaterally VIII: hearing normal bilaterally IX,X: uvula rises symmetrically XI: bilateral shoulder shrug XII: midline tongue extension without atrophy or fasciculations Motor: Exhibits no frank motor weakness at this moment. Tone and bulk:normal tone throughout; no atrophy noted Sensory: Pinprick and light touch diminished in the left Deep Tendon Reflexes:  1+ all over Plantars: Right: downgoingLeft: downgoing Cerebellar: Impaired left finger to nose and heel-knee-shin  Gait:  Unable to test due to multiple leads     Lab Results Last 48 Hours    Results for orders placed or performed during the hospital encounter of 12/29/14 (from the past 48 hour(s))  Protime-INR Status: None   Collection Time: 12/29/14 12:38 PM  Result Value Ref Range   Prothrombin Time 14.7 11.6 - 15.2 seconds   INR 1.13 0.00 - 1.49  APTT Status: None   Collection Time: 12/29/14 12:38 PM  Result Value Ref Range   aPTT 31 24 - 37 seconds  CBC Status: None   Collection Time: 12/29/14 12:38 PM  Result Value Ref Range   WBC 10.1 4.0 - 10.5 K/uL   RBC 4.72 4.22 - 5.81 MIL/uL   Hemoglobin 14.9 13.0 - 17.0 g/dL  HCT 45.1 39.0 - 52.0 %   MCV 95.6 78.0 - 100.0 fL   MCH 31.6 26.0 - 34.0 pg   MCHC 33.0 30.0 - 36.0 g/dL   RDW 13.1 11.5 - 15.5 %    Platelets 163 150 - 400 K/uL  Differential Status: None   Collection Time: 12/29/14 12:38 PM  Result Value Ref Range   Neutrophils Relative % 58 %   Neutro Abs 5.8 1.7 - 7.7 K/uL   Lymphocytes Relative 33 %   Lymphs Abs 3.3 0.7 - 4.0 K/uL   Monocytes Relative 7 %   Monocytes Absolute 0.8 0.1 - 1.0 K/uL   Eosinophils Relative 2 %   Eosinophils Absolute 0.2 0.0 - 0.7 K/uL   Basophils Relative 0 %   Basophils Absolute 0.0 0.0 - 0.1 K/uL  I-stat troponin, ED (not at [facility], [facility]) Status: None   Collection Time: 12/29/14 1:04 PM  Result Value Ref Range   Troponin i, poc 0.02 0.00 - 0.08 ng/mL   Comment 3       Comment: Due to the release kinetics of cTnI, a negative result within the first hours of the onset of symptoms does not rule out myocardial infarction with certainty. If myocardial infarction is still suspected, repeat the test at appropriate intervals.   I-Stat Chem 8, ED (not at [facility], [facility]) Status: Abnormal   Collection Time: 12/29/14 1:07 PM  Result Value Ref Range   Sodium 143 135 - 145 mmol/L   Potassium 4.2 3.5 - 5.1 mmol/L   Chloride 109 101 - 111 mmol/L   BUN 17 6 - 20 mg/dL   Creatinine, Ser 1.10 0.61 - 1.24 mg/dL   Glucose, Bld 86 65 - 99 mg/dL   Calcium, Ion 1.05 (L) 1.13 - 1.30 mmol/L   TCO2 23 0 - 100 mmol/L   Hemoglobin 16.0 13.0 - 17.0 g/dL   HCT 47.0 39.0 - 52.0 %      Imaging Results (Last 48 hours)    Ct Head Wo Contrast  12/29/2014 CLINICAL DATA: Code stroke, sudden onset of slurred speech and LEFT-sided weakness, RIGHT gaze preference, history carotid artery disease, hypertension, smoker EXAM: CT HEAD WITHOUT CONTRAST TECHNIQUE: Contiguous axial images were obtained from the base of the skull through the vertex without intravenous contrast. COMPARISON: 11/16/2008; correlation MR/MRA brain 11/19/2008 FINDINGS: Generalized  atrophy. Diffuse dilatation of the ventricular system not significantly changed. No midline shift or mass effect. Small vessel chronic ischemic changes of deep cerebral white matter. Question tiny old deep infarcts in the cerebellar hemispheres bilaterally. Area of low attenuation identified at the lateral LEFT frontal region compatible with age-indeterminate infarction. No intracranial hemorrhage, mass lesion, or evidence of additional acute infarction. No extra-axial fluid collections. Atherosclerotic calcifications bilaterally at the carotid siphons. Bones and sinuses unremarkable. IMPRESSION: Atrophy with small vessel chronic ischemic changes. Age-indeterminate infarct LEFT frontal region. Questionable tiny lacunar infarcts at the cerebellar hemispheres. No intracranial hemorrhage. Findings called to Dr. Armida Sans on 12/29/2014 at 1309 hour. Electronically Signed By: Lavonia Dana M.D. On: 12/29/2014 13:09     Assessment: 74 y.o. male with likely acute right MCA distribution infarct. Initial presentation with dense left hemiparesis that resolved in the ED but still with high NIHSS 11. CT brain reported an area of low attenuation identified at the lateral LEFT frontal region compatible with age-indeterminate infarction which doesn't match anatomically the findings on exam. Suspect a cortical right MCA stroke. Although he has no motor weakness at this moment he was quite hemiparetic on  presentation and has NIHSS 11 at this moment. Reviewed inclusion and exclusion criteria for iv thrombolysis and he is a suitable candidate for iv tPA and thus will proceed accordingly.  CTA brain to exclude the presence of a proximal right MCA occlusion/clot that could be amenable to endovascular intervention. Admit to NICU. Stroke team will follow up tomorrow.   Stroke Risk Factors - age, HTN, CAD, carotid disease.  Plan: 1. HgbA1c, fasting lipid panel 2. MRI, MRA of the brain without contrast 3.  Echocardiogram 4. Carotid dopplers 5. Prophylactic therapy-aspirin as per protocol 6. Risk factor modification 7. Telemetry monitoring 8. Frequent neuro checks 9. PT/OT SLP   Dorian Pod, MD [facility] [phone]  12/29/2014, 1:30 PM      Routing History     Date/Time From To Method   12/29/2014 1:54 PM Amie Portland, MD Colon Branch, MD In [place]

## 2014-12-29 NOTE — Consult Note (Deleted)
Referring Physician: ED    Chief Complaint: code stroke, left hemiparesis, dysarthria, right gaze preference  HPI:                                                                                                                                         [name] is an 74 y.o. male with a past medical history significant for HTN, CAD s/p CABG, carotid disease s/p right CEA, former alcohol abuse, brought to the ED via EMS as a code stroke due to acute onset of the above stated symptoms. Patient friend is at the bedside and stated that [name] was normal when she talked to him on the phone around 10:30 today, but approximately 1 hour later he called her and she got concerned because he was slurring his words. EMS was called by the patient and they found him with with flaccid weakness left side, slurred speech, and gaze preference to the right. Upon arrival to the ED he was alert and awake with dense left hemiparesis, right gaze preference, and dysarthria. His left hemiparesis virtually resolved after returning from the CT suite but still with a high NIHSS 11. CT brain showed an area of low attenuation identified at the lateral LEFT frontal region compatible with age-indeterminate infarction. At baseline patient walks with a walker and is able to perform majority of his activities of daily living with minimal assistance  Date last known well: 12/29/14 Time last known well: 10:30 am  tPA Given: yes NIHSS: 11 MRS: 1  Past Medical History  Diagnosis Date  . Carotid arterial disease     s/p RCEA 11/2008 (Note: hx of negative cardiac nuc 03/2009)  . Hypertension   . Colonic polyp     cscopes 08/18/05 tics,polyps.Marland Kitchenall hyperplastic  . GERD (gastroesophageal reflux disease)     Barrets Esop. (per bx 08/2005) and HH f/u Dr.Schooler  . Anal warts   . Anal fissure   . Hemorrhoids   . 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 Panic attack   . Osteoarthritis   .  Depression   . H/O ETOH abuse     Former. Quit ~2012  . BPH (benign prostatic hyperplasia)     s/p surgery  . Anxiety and depression 02/14/2009    Qualifier: Diagnosis of  By: Larose Kells MD, [place]     Past Surgical History  Procedure Laterality Date  . Right carotid endartectomy with dacron patch angioplasty  11/2008  . Appendectomy    . Knee surgery    . Foot surgery      fx  . Total hip arthroplasty  2005    R due to OA  . Skin tags excised    . Transurethral resection of prostate    . Cataract extraction      wears contact lens L, good vision  .  Coronary artery bypass graft  03/23/2011    Procedure: CORONARY ARTERY BYPASS GRAFTING (CABG);  Surgeon: Melrose Nakayama, MD;  Location: [facility];  Service: Open Heart Surgery;  Laterality: N/A;  CABG x four using bilateral greater saphenous vein, harvested endoscopically  . Left heart catheterization with coronary angiogram N/A 03/19/2011    Procedure: LEFT HEART CATHETERIZATION WITH CORONARY ANGIOGRAM;  Surgeon: Thayer Headings, MD;  Location: [facility] CATH LAB;  Service: Cardiovascular;  Laterality: N/A;    Family History  Problem Relation Age of Onset  . Lung cancer Father     mets to brain- deceased  . Arthritis Mother   . Hypertension Mother   . Coronary artery disease Neg Hx   . Colon cancer Neg Hx   . Prostate cancer Neg Hx   . Diabetes Paternal Aunt   . Stroke Paternal Grandfather    Social History:  reports that he has been smoking Cigarettes.  He has a 52 pack-year smoking history. He has never used smokeless tobacco. He reports that he does not drink alcohol or use illicit drugs. Family history: no MS, epilepsy, or brain tumor Allergies:  Allergies  Allergen Reactions  . Sertraline Hcl     REACTION: heart palpitation     Medications:                                                                                                                           I have reviewed the patient's current medications.  ROS:                                                                                                                                        History obtained from friend,  chart review and the patient  General ROS: negative for - chills, fatigue, fever, night sweats, weight gain or weight loss Psychological ROS: negative for - behavioral disorder, hallucinations, memory difficulties, mood swings or suicidal ideation Ophthalmic ROS: negative for - blurry vision, double vision, eye pain or loss of vision ENT ROS: negative for - epistaxis, nasal discharge, oral lesions, sore throat, tinnitus or vertigo Allergy and Immunology ROS: negative for - hives or itchy/watery eyes Hematological and Lymphatic ROS: negative for - bleeding problems, bruising or swollen lymph nodes Endocrine ROS: negative for - galactorrhea, hair pattern changes, polydipsia/polyuria or temperature intolerance Respiratory ROS: negative for - cough, hemoptysis, shortness  of breath or wheezing Cardiovascular ROS: negative for - chest pain, dyspnea on exertion, edema or irregular heartbeat Gastrointestinal ROS: negative for - abdominal pain, diarrhea, hematemesis, nausea/vomiting or stool incontinence Genito-Urinary ROS: negative for - dysuria, hematuria, incontinence or urinary frequency/urgency Musculoskeletal ROS: negative for - joint swelling Neurological ROS: as noted in HPI Dermatological ROS: negative for rash and skin lesion changes   Physical exam:  Constitutional: well developed, pleasant male in no apparent distress. Blood pressure 139/71, pulse 71, temperature 98 F (36.7 C), temperature source Oral, resp. rate 22, weight 91.627 kg (202 lb), SpO2 99 %. Eyes: no jaundice or exophthalmos.  Head: normocephalic. Neck: supple, no bruits, no JVD. Cardiac: no murmurs. Lungs: clear. Abdomen: soft, no tender, no mass. Extremities: no edema, clubbing, or cyanosis.  Skin: no rash  Neurologic Examination:                                                                                                       General: NAD Mental Status: Alert, oriented, thought content appropriate. Dysarthria without evidence of aphasia.  Able to follow 3 step commands without difficulty. Cranial Nerves: II: Discs flat bilaterally; Visual fields grossly normal, pupils equal, round, reactive to light and accommodation III,IV, VI: ptosis not present, mild right gaze preference V,VII: smile asymmetric due to left face wedakness, facial light touch sensation normal bilaterally VIII: hearing normal bilaterally IX,X: uvula rises symmetrically XI: bilateral shoulder shrug XII: midline tongue extension without atrophy or fasciculations Motor: Exhibits no frank motor weakness at this moment. Tone and bulk:normal tone throughout; no atrophy noted Sensory: Pinprick and light touch diminished in the left Deep Tendon Reflexes:  1+ all over Plantars: Right: downgoing   Left: downgoing Cerebellar: Impaired left finger to nose and heel-knee-shin  Gait:  Unable to test due to multiple leads    Results for orders placed or performed during the hospital encounter of 12/29/14 (from the past 48 hour(s))  Protime-INR     Status: None   Collection Time: 12/29/14 12:38 PM  Result Value Ref Range   Prothrombin Time 14.7 11.6 - 15.2 seconds   INR 1.13 0.00 - 1.49  APTT     Status: None   Collection Time: 12/29/14 12:38 PM  Result Value Ref Range   aPTT 31 24 - 37 seconds  CBC     Status: None   Collection Time: 12/29/14 12:38 PM  Result Value Ref Range   WBC 10.1 4.0 - 10.5 K/uL   RBC 4.72 4.22 - 5.81 MIL/uL   Hemoglobin 14.9 13.0 - 17.0 g/dL   HCT 45.1 39.0 - 52.0 %   MCV 95.6 78.0 - 100.0 fL   MCH 31.6 26.0 - 34.0 pg   MCHC 33.0 30.0 - 36.0 g/dL   RDW 13.1 11.5 - 15.5 %   Platelets 163 150 - 400 K/uL  Differential     Status: None   Collection Time: 12/29/14 12:38 PM  Result Value Ref Range   Neutrophils Relative % 58 %   Neutro Abs 5.8 1.7 - 7.7  K/uL   Lymphocytes  Relative 33 %   Lymphs Abs 3.3 0.7 - 4.0 K/uL   Monocytes Relative 7 %   Monocytes Absolute 0.8 0.1 - 1.0 K/uL   Eosinophils Relative 2 %   Eosinophils Absolute 0.2 0.0 - 0.7 K/uL   Basophils Relative 0 %   Basophils Absolute 0.0 0.0 - 0.1 K/uL  I-stat troponin, ED (not at [facility], [facility])     Status: None   Collection Time: 12/29/14  1:04 PM  Result Value Ref Range   Troponin i, poc 0.02 0.00 - 0.08 ng/mL   Comment 3            Comment: Due to the release kinetics of cTnI, a negative result within the first hours of the onset of symptoms does not rule out myocardial infarction with certainty. If myocardial infarction is still suspected, repeat the test at appropriate intervals.   I-Stat Chem 8, ED  (not at [facility], [facility])     Status: Abnormal   Collection Time: 12/29/14  1:07 PM  Result Value Ref Range   Sodium 143 135 - 145 mmol/L   Potassium 4.2 3.5 - 5.1 mmol/L   Chloride 109 101 - 111 mmol/L   BUN 17 6 - 20 mg/dL   Creatinine, Ser 1.10 0.61 - 1.24 mg/dL   Glucose, Bld 86 65 - 99 mg/dL   Calcium, Ion 1.05 (L) 1.13 - 1.30 mmol/L   TCO2 23 0 - 100 mmol/L   Hemoglobin 16.0 13.0 - 17.0 g/dL   HCT 47.0 39.0 - 52.0 %   Ct Head Wo Contrast  12/29/2014  CLINICAL DATA:  Code stroke, sudden onset of slurred speech and LEFT-sided weakness, RIGHT gaze preference, history carotid artery disease, hypertension, smoker EXAM: CT HEAD WITHOUT CONTRAST TECHNIQUE: Contiguous axial images were obtained from the base of the skull through the vertex without intravenous contrast. COMPARISON:  11/16/2008; correlation MR/MRA brain 11/19/2008 FINDINGS: Generalized atrophy. Diffuse dilatation of the ventricular system not significantly changed. No midline shift or mass effect. Small vessel chronic ischemic changes of deep cerebral white matter. Question tiny old deep infarcts in the cerebellar hemispheres bilaterally. Area of low attenuation identified at the lateral LEFT frontal region  compatible with age-indeterminate infarction. No intracranial hemorrhage, mass lesion, or evidence of additional acute infarction. No extra-axial fluid collections. Atherosclerotic calcifications bilaterally at the carotid siphons. Bones and sinuses unremarkable. IMPRESSION: Atrophy with small vessel chronic ischemic changes. Age-indeterminate infarct LEFT frontal region. Questionable tiny lacunar infarcts at the cerebellar hemispheres. No intracranial hemorrhage. Findings called to Dr. Armida Sans on 12/29/2014 at 1309 hour. Electronically Signed   By: Lavonia Dana M.D.   On: 12/29/2014 13:09     Assessment: 74 y.o. male with likely acute right MCA distribution infarct. Initial presentation with dense left hemiparesis that resolved in the ED but still with high NIHSS 11. CT brain reported an area of low attenuation identified at the lateral LEFT frontal region compatible with age-indeterminate infarction which doesn't match anatomically the findings on exam. Suspect a cortical right MCA stroke. Although he has no motor weakness at this moment he was quite hemiparetic on presentation and has NIHSS 11 at this moment. Reviewed inclusion and exclusion criteria for iv thrombolysis and he is a suitable candidate for iv tPA and thus will proceed accordingly.  CTA brain to exclude the presence of a proximal right MCA occlusion/clot that could be amenable to endovascular intervention. Admit to NICU. Stroke team will follow up tomorrow.   Stroke Risk Factors - age,  HTN, CAD,  carotid disease.  Plan: 1. HgbA1c, fasting lipid panel 2. MRI, MRA  of the brain without contrast 3. Echocardiogram 4. Carotid dopplers 5. Prophylactic therapy-aspirin as per protocol 6. Risk factor modification 7. Telemetry monitoring 8. Frequent neuro checks 9. PT/OT SLP   Dorian Pod, MD [facility] [phone]  12/29/2014, 1:30 PM

## 2014-12-29 NOTE — ED Provider Notes (Signed)
MSE was initiated and I personally evaluated the patient and placed orders (if any) at  12:48 PM on December 29, 2014.  The patient appears stable so that the remainder of the MSE may be completed by another provider.  Code stroke on arrival. Deviation of eyes to right and left sided weakness. Met at bridge. Airway intact. Blood drawn performed by me after staff unable to obtain blood for testing. Stat head CT now. Neurology with pt  Apiration of blood/ Performed by: Hoy Morn Consent obtained. Required items: required blood products, implants, devices, and special equipment available Patient identity confirmed: verbally with patient Time out: Immediately prior to procedure a "time out" was called to verify the correct patient, procedure, equipment, support staff and site/side marked as required. Preparation: Patient was prepped and draped in the usual sterile fashion. Patient tolerance: Patient tolerated the procedure well with no immediate complications. Location of aspiration: left great saphenous vein     Jola Schmidt, MD 12/29/14 1249

## 2014-12-30 ENCOUNTER — Inpatient Hospital Stay (HOSPITAL_COMMUNITY): Payer: Medicare PPO

## 2014-12-30 ENCOUNTER — Encounter (HOSPITAL_COMMUNITY): Payer: Self-pay

## 2014-12-30 DIAGNOSIS — I6789 Other cerebrovascular disease: Secondary | ICD-10-CM

## 2014-12-30 LAB — LIPID PANEL
Cholesterol: 112 mg/dL (ref 0–200)
HDL: 30 mg/dL — ABNORMAL LOW (ref >40)
LDL CALC: 59 mg/dL (ref 0–99)
Total CHOL/HDL Ratio: 3.7 RATIO
Triglycerides: 114 mg/dL (ref <150)
VLDL: 23 mg/dL (ref 0–40)

## 2014-12-30 MED ORDER — ASPIRIN 325 MG PO TABS: 325.0000 mg | ORAL_TABLET | Freq: Every day | ORAL | Status: DC

## 2014-12-30 MED ORDER — HEPARIN SODIUM (PORCINE) 5000 UNIT/ML IJ SOLN
5000.0000 [IU] | Freq: Three times a day (TID) | INTRAMUSCULAR | Status: DC
  Administered 2014-12-30 – 2014-12-31 (×3): 5000 [IU] via SUBCUTANEOUS
  Filled 2014-12-30 (×3): qty 1

## 2014-12-30 MED ORDER — ASPIRIN 325 MG PO TABS
325.0000 mg | ORAL_TABLET | Freq: Every day | ORAL | Status: DC
  Administered 2014-12-30 – 2014-12-31 (×2): 325 mg via ORAL
  Filled 2014-12-30 (×2): qty 1

## 2014-12-30 MED ORDER — HEPARIN SODIUM (PORCINE) 5000 UNIT/ML IJ SOLN: 5000.0000 [IU] | Freq: Three times a day (TID) | INTRAMUSCULAR | Status: DC

## 2014-12-30 NOTE — Progress Notes (Signed)
Spoke with MD regarding increased confusion.

## 2014-12-30 NOTE — Progress Notes (Signed)
PT Cancellation Note  Patient Details Name: [name] MRN: [id number] DOB: Feb 17, 1940   Cancelled Treatment:    Reason Eval/Treat Not Completed: Medical issues which prohibited therapy.  Pt on strict bed rest until late this afternoon per RN.  PT will continue to follow and will need updated activity orders to complete PT evaluation once appropriate.  Thank you for this order.  Joslyn Hy PT, DPT [phone] Pager: [phone] 12/30/2014, 9:52 AM

## 2014-12-30 NOTE — Progress Notes (Signed)
Stroke Team Progress Note  HISTORY  74 Y/O with a past medical history significant for HTN, CAD s/p CABG, carotid disease s/p right CEA, former alcohol abuse, brought to the ED via EMS as a code stroke due to acute onset of L hemiparesis, dysarthria, R gaze preference.   Patient friend is at the bedside and stated that Mr Gaugh was normal when she talked to him on the phone around 10:30 today, but approximately 1 hour later he called her and she got concerned because he was slurring his words. EMS was called by the patient and they found him with with flaccid weakness left side, slurred speech, and gaze preference to the right. Upon arrival to the ED he was alert and awake with dense left hemiparesis, right gaze preference, and dysarthria. His left hemiparesis virtually resolved after returning from the CT suite but still with a high NIHSS 11.   SUBJECTIVE S/p TPA at 13:00. Exam much improved. Antigravity strength on L side.  Follows commands.     OBJECTIVE Most recent Vital Signs: Filed Vitals:   12/30/14 0630 12/30/14 0700 12/30/14 0730 12/30/14 0805  BP: 137/77 124/77 142/69   Pulse: 74 69 70   Temp:    98.5 F (36.9 C)  TempSrc:    Oral  Resp: 17 20 21    Height:      Weight:      SpO2: 92% 94% 95%    CBG (last 3)  No results for input(s): GLUCAP in the last 72 hours.  IV Fluid Intake:   . sodium chloride 75 mL/hr at 12/30/14 0351    MEDICATIONS  . pantoprazole (PROTONIX) IV  40 mg Intravenous QHS   PRN:  acetaminophen **OR** acetaminophen, labetalol, senna-docusate  Diet:  Diet Heart Room service appropriate?: Yes; Fluid consistency:: Thin  liquids Activity:  up with assistance DVT Prophylaxis:  scds followed by heparin sq  CLINICALLY SIGNIFICANT STUDIES Basic Metabolic Panel:  Recent Labs Lab 12/29/14 1238 12/29/14 1307  NA 142 143  K 4.1 4.2  CL 110 109  CO2 25  --   GLUCOSE 95 86  BUN 14 17  CREATININE 1.26* 1.10  CALCIUM 9.1  --    Liver Function  Tests:  Recent Labs Lab 12/29/14 1238  AST 21  ALT 16*  ALKPHOS 89  BILITOT 0.5  PROT 6.8  ALBUMIN 3.5   CBC:  Recent Labs Lab 12/29/14 1238 12/29/14 1307  WBC 10.1  --   NEUTROABS 5.8  --   HGB 14.9 16.0  HCT 45.1 47.0  MCV 95.6  --   PLT 163  --    Coagulation:  Recent Labs Lab 12/29/14 1238  LABPROT 14.7  INR 1.13   Cardiac Enzymes: No results for input(s): CKTOTAL, CKMB, CKMBINDEX, TROPONINI in the last 168 hours. Urinalysis:  Recent Labs Lab 12/29/14 1805  COLORURINE YELLOW  LABSPEC 1.010  PHURINE 6.0  GLUCOSEU NEGATIVE  HGBUR TRACE*  BILIRUBINUR NEGATIVE  KETONESUR 15*  PROTEINUR NEGATIVE  NITRITE NEGATIVE  LEUKOCYTESUR NEGATIVE   Lipid Panel    Component Value Date/Time   CHOL 112 12/30/2014 0320   TRIG 114 12/30/2014 0320   HDL 30* 12/30/2014 0320   CHOLHDL 3.7 12/30/2014 0320   VLDL 23 12/30/2014 0320   LDLCALC 59 12/30/2014 0320   HgbA1C  Lab Results  Component Value Date   HGBA1C 5.6 09/18/2014    Urine Drug Screen:     Component Value Date/Time   LABOPIA NONE DETECTED 12/29/2014 1805  COCAINSCRNUR NONE DETECTED 12/29/2014 1805   LABBENZ NONE DETECTED 12/29/2014 1805   AMPHETMU NONE DETECTED 12/29/2014 1805   THCU NONE DETECTED 12/29/2014 1805   LABBARB NONE DETECTED 12/29/2014 1805    Alcohol Level:  Recent Labs Lab 12/29/14 1238  ETH <5    Ct Angio Head W/cm &/or Wo Cm  12/29/2014  CLINICAL DATA:  Sudden onset of expressive aphasia. Abnormal CT of the head. Left-sided weakness and gaze preference to the right. Left hemiparesis has near completely resolved. EXAM: CT ANGIOGRAPHY HEAD AND NECK TECHNIQUE: Multidetector CT imaging of the head and neck was performed using the standard protocol during bolus administration of intravenous contrast. Multiplanar CT image reconstructions and MIPs were obtained to evaluate the vascular anatomy. Carotid stenosis measurements (when applicable) are obtained utilizing NASCET criteria, using  the distal internal carotid diameter as the denominator. CONTRAST:  7mL OMNIPAQUE IOHEXOL 350 MG/ML SOLN COMPARISON:  CT head without contrast from the same day. MRI brain 11/19/2008 FINDINGS: CT HEAD Brain: Age indeterminate infarct is again noted in the anterior left frontal lobe and operculum. Moderate atrophy and diffuse white matter disease is present. The ventricles are proportionate to the degree of atrophy. No significant extra-axial fluid collection present. Calvarium and skull base: The calvarium is intact. The skullbase is unremarkable. Paranasal sinuses: Mucosal thickening polypoid changes are noted in the posterior maxillary sinuses bilaterally. Paranasal sinuses are otherwise clear. There is some fluid in the mastoid air cells bilaterally. No obstructing nasopharyngeal lesion is evident. Orbits: Bilateral lens replacements are present. The globes and orbits are otherwise intact. CTA NECK Aortic arch: A 3 vessel arch configuration is present. Atherosclerotic changes are noted at the origin of the left subclavian artery without a significant stenosis relative to the more distal vessel. Right carotid system: The right common carotid artery is tortuous without a significant focal stenosis. Atherosclerotic irregularity is present with an the endarterectomy. There is no focal stenosis relative to the more distal vessel. The cervical right ICA is otherwise normal. Left carotid system: The left common carotid artery demonstrates atherosclerotic calcifications medially without a significant stenosis relative to the more distal vessel. The left carotid bifurcation demonstrates more dense calcifications without a significant stenosis relative to the more distal vessel. The cervical left ICA is otherwise normal. Vertebral arteries:The vertebral arteries both originate from the subclavian arteries. Proximal calcifications are present within the right subclavian artery without a significant stenosis. The vertebral  arteries are codominant. There is no significant stenosis within either vertebral artery in the neck. Skeleton: Multilevel endplate degenerative changes are present in the cervical spine. Degenerative anterolisthesis as present C3-4 and C4-5. Vertebral body heights and alignment are otherwise normal. Osseous foraminal narrowing is present at multiple levels, most severe on the right at C5-6. Median sternotomy is noted. There is mild rightward curvature of the cervical spine. Other neck: No focal mucosal or submucosal lesions are present. Thyroid is within limits. There is no significant cervical adenopathy. The salivary glands are intact. CTA HEAD Anterior circulation: Atherosclerotic calcifications are present within the precavernous and cavernous internal carotid arteries bilaterally without significant stenosis. The A1 and M1 segments are normal. The anterior communicating artery is patent. The MCA bifurcations are intact. The ACA and MCA branch vessels are within normal limits. Posterior circulation: The vertebral arteries are codominant. The PICA origins are visualized and normal. The basilar artery is within normal limits. Both posterior cerebral arteries originate from the basilar tip. PCA branch vessels are intact. Venous sinuses: The dural sinuses are  patent. The right transverse sinus is dominant. The straight sinus and deep cerebral veins are intact. The cortical veins are unremarkable. Anatomic variants: None Delayed phase: The postcontrast images demonstrate no pathologic enhancement. IMPRESSION: 1. Age indeterminate infarct involving the anterior left insular cortex and frontal operculum. 2. Moderate atrophy and white matter disease. 3. No other acute cortical infarct. 4. Atherosclerotic calcifications are present in the left common carotid artery the left carotid bifurcation bilateral cavernous internal carotid arteries without significant stenosis. 5. Atherosclerotic calcifications at the origin of  the left subclavian artery and right subclavian artery. There is no associated stenosis. 6. Multilevel spondylosis of the cervical spine with foraminal narrowing at multiple levels. Electronically Signed   By: San Morelle M.D.   On: 12/29/2014 14:45   Ct Head Wo Contrast  12/29/2014  CLINICAL DATA:  Code stroke, sudden onset of slurred speech and LEFT-sided weakness, RIGHT gaze preference, history carotid artery disease, hypertension, smoker EXAM: CT HEAD WITHOUT CONTRAST TECHNIQUE: Contiguous axial images were obtained from the base of the skull through the vertex without intravenous contrast. COMPARISON:  11/16/2008; correlation MR/MRA brain 11/19/2008 FINDINGS: Generalized atrophy. Diffuse dilatation of the ventricular system not significantly changed. No midline shift or mass effect. Small vessel chronic ischemic changes of deep cerebral white matter. Question tiny old deep infarcts in the cerebellar hemispheres bilaterally. Area of low attenuation identified at the lateral LEFT frontal region compatible with age-indeterminate infarction. No intracranial hemorrhage, mass lesion, or evidence of additional acute infarction. No extra-axial fluid collections. Atherosclerotic calcifications bilaterally at the carotid siphons. Bones and sinuses unremarkable. IMPRESSION: Atrophy with small vessel chronic ischemic changes. Age-indeterminate infarct LEFT frontal region. Questionable tiny lacunar infarcts at the cerebellar hemispheres. No intracranial hemorrhage. Findings called to Dr. Armida Sans on 12/29/2014 at 1309 hour. Electronically Signed   By: Lavonia Dana M.D.   On: 12/29/2014 13:09   Ct Angio Neck W/cm &/or Wo/cm  12/29/2014  CLINICAL DATA:  Sudden onset of expressive aphasia. Abnormal CT of the head. Left-sided weakness and gaze preference to the right. Left hemiparesis has near completely resolved. EXAM: CT ANGIOGRAPHY HEAD AND NECK TECHNIQUE: Multidetector CT imaging of the head and neck was  performed using the standard protocol during bolus administration of intravenous contrast. Multiplanar CT image reconstructions and MIPs were obtained to evaluate the vascular anatomy. Carotid stenosis measurements (when applicable) are obtained utilizing NASCET criteria, using the distal internal carotid diameter as the denominator. CONTRAST:  59mL OMNIPAQUE IOHEXOL 350 MG/ML SOLN COMPARISON:  CT head without contrast from the same day. MRI brain 11/19/2008 FINDINGS: CT HEAD Brain: Age indeterminate infarct is again noted in the anterior left frontal lobe and operculum. Moderate atrophy and diffuse white matter disease is present. The ventricles are proportionate to the degree of atrophy. No significant extra-axial fluid collection present. Calvarium and skull base: The calvarium is intact. The skullbase is unremarkable. Paranasal sinuses: Mucosal thickening polypoid changes are noted in the posterior maxillary sinuses bilaterally. Paranasal sinuses are otherwise clear. There is some fluid in the mastoid air cells bilaterally. No obstructing nasopharyngeal lesion is evident. Orbits: Bilateral lens replacements are present. The globes and orbits are otherwise intact. CTA NECK Aortic arch: A 3 vessel arch configuration is present. Atherosclerotic changes are noted at the origin of the left subclavian artery without a significant stenosis relative to the more distal vessel. Right carotid system: The right common carotid artery is tortuous without a significant focal stenosis. Atherosclerotic irregularity is present with an the endarterectomy. There is no focal  stenosis relative to the more distal vessel. The cervical right ICA is otherwise normal. Left carotid system: The left common carotid artery demonstrates atherosclerotic calcifications medially without a significant stenosis relative to the more distal vessel. The left carotid bifurcation demonstrates more dense calcifications without a significant stenosis  relative to the more distal vessel. The cervical left ICA is otherwise normal. Vertebral arteries:The vertebral arteries both originate from the subclavian arteries. Proximal calcifications are present within the right subclavian artery without a significant stenosis. The vertebral arteries are codominant. There is no significant stenosis within either vertebral artery in the neck. Skeleton: Multilevel endplate degenerative changes are present in the cervical spine. Degenerative anterolisthesis as present C3-4 and C4-5. Vertebral body heights and alignment are otherwise normal. Osseous foraminal narrowing is present at multiple levels, most severe on the right at C5-6. Median sternotomy is noted. There is mild rightward curvature of the cervical spine. Other neck: No focal mucosal or submucosal lesions are present. Thyroid is within limits. There is no significant cervical adenopathy. The salivary glands are intact. CTA HEAD Anterior circulation: Atherosclerotic calcifications are present within the precavernous and cavernous internal carotid arteries bilaterally without significant stenosis. The A1 and M1 segments are normal. The anterior communicating artery is patent. The MCA bifurcations are intact. The ACA and MCA branch vessels are within normal limits. Posterior circulation: The vertebral arteries are codominant. The PICA origins are visualized and normal. The basilar artery is within normal limits. Both posterior cerebral arteries originate from the basilar tip. PCA branch vessels are intact. Venous sinuses: The dural sinuses are patent. The right transverse sinus is dominant. The straight sinus and deep cerebral veins are intact. The cortical veins are unremarkable. Anatomic variants: None Delayed phase: The postcontrast images demonstrate no pathologic enhancement. IMPRESSION: 1. Age indeterminate infarct involving the anterior left insular cortex and frontal operculum. 2. Moderate atrophy and white matter  disease. 3. No other acute cortical infarct. 4. Atherosclerotic calcifications are present in the left common carotid artery the left carotid bifurcation bilateral cavernous internal carotid arteries without significant stenosis. 5. Atherosclerotic calcifications at the origin of the left subclavian artery and right subclavian artery. There is no associated stenosis. 6. Multilevel spondylosis of the cervical spine with foraminal narrowing at multiple levels. Electronically Signed   By: San Morelle M.D.   On: 12/29/2014 14:45    CT of the brain  Age indeterminate infarct involving the anterior left insular cortex and frontal operculum. Atherosclerotic calcifications are present in the left common carotid artery the left carotid bifurcation bilateral cavernous internal carotid arteries without significant stenosis.MRI of the brain    MRA of the brain    Carotid Doppler  pending  2D Echocardiogram  pending  CXR    EKG  normal EKG, normal sinus rhythm, unchanged from previous tracings. For complete results please see formal report.   Therapy Recommendations pending     Neurological Examination Mental Status: Alert, oriented, to name, slight dysarthria.  Cranial Nerves: II: Discs flat bilaterally; Visual fields grossly normal, pupils equal, round, reactive to light and accommodation III,IV, VI: ptosis not present, extra-ocular motions intact bilaterally V,VII: smile symmetric, facial light touch sensation normal bilaterally VIII: hearing normal bilaterally IX,X: gag reflex present XI: bilateral shoulder shrug XII: midline tongue extension Motor: Right : Upper extremity   5/5    Left:     Upper extremity   4/5  Lower extremity   5/5     Lower extremity   4/5 Tone and bulk:normal tone  throughout; no atrophy noted Sensory: Pinprick and light touch intact throughout, bilaterally Deep Tendon Reflexes: 1+ and symmetric throughout Plantars: Right: downgoing   Left:  downgoing Cerebellar: normal finger-to-nose but slow Gait: not tested         LDL [facility] day # 1  TREATMENT/PLAN 74 Y/O with a past medical history significant for HTN, CAD s/p CABG, carotid disease s/p right CEA, former alcohol abuse, brought to the ED via EMS as a code stroke due to acute onset of L hemiparesis, dysarthria, R gaze preference.   Symptoms much improved, following commands 4/5 b/l L upper and LE.   - SBP <180 for 24 hrs post TPA, then <160 - was on ASA 81 daily. Will increase to 325 and heparin sq 24 hrs post TPA if no hemorrhage on imaging.   - pt/ot - speech therapy -2d echo - carotid - MRI/MRA pending which will be done at around 24 hr mark post TPA  This patient is critically ill and at significant risk of neurological worsening, death and care requires constant monitoring of vital signs, hemodynamics,respiratory and cardiac monitoring, extensive review of multiple databases, frequent neurological assessment, discussion with family, other specialists and medical decision making of high complexity. High risk of further hemorrhage post TPA, seizures, hemodynamic instability. 55 minutes of critical care spent.   SIGNED Leotis Pain    To contact Stroke Continuity provider, please refer to [email or web address] After hours, contact General Neurology

## 2014-12-30 NOTE — Progress Notes (Signed)
Utilization Review Completed.Desi Rowe T12/18/2016  

## 2014-12-30 NOTE — Progress Notes (Signed)
  Echocardiogram 2D Echocardiogram has been performed.  [name] 12/30/2014, 5:25 PM

## 2014-12-31 DIAGNOSIS — R0682 Tachypnea, not elsewhere classified: Secondary | ICD-10-CM | POA: Insufficient documentation

## 2014-12-31 DIAGNOSIS — F1011 Alcohol abuse, in remission: Secondary | ICD-10-CM | POA: Insufficient documentation

## 2014-12-31 DIAGNOSIS — I10 Essential (primary) hypertension: Secondary | ICD-10-CM

## 2014-12-31 DIAGNOSIS — I63511 Cerebral infarction due to unspecified occlusion or stenosis of right middle cerebral artery: Secondary | ICD-10-CM | POA: Insufficient documentation

## 2014-12-31 DIAGNOSIS — I69398 Other sequelae of cerebral infarction: Secondary | ICD-10-CM

## 2014-12-31 DIAGNOSIS — I69354 Hemiplegia and hemiparesis following cerebral infarction affecting left non-dominant side: Secondary | ICD-10-CM

## 2014-12-31 DIAGNOSIS — F1010 Alcohol abuse, uncomplicated: Secondary | ICD-10-CM

## 2014-12-31 DIAGNOSIS — I639 Cerebral infarction, unspecified: Secondary | ICD-10-CM

## 2014-12-31 DIAGNOSIS — I2510 Atherosclerotic heart disease of native coronary artery without angina pectoris: Secondary | ICD-10-CM | POA: Insufficient documentation

## 2014-12-31 DIAGNOSIS — F4323 Adjustment disorder with mixed anxiety and depressed mood: Secondary | ICD-10-CM | POA: Insufficient documentation

## 2014-12-31 DIAGNOSIS — N400 Enlarged prostate without lower urinary tract symptoms: Secondary | ICD-10-CM | POA: Insufficient documentation

## 2014-12-31 DIAGNOSIS — I69359 Hemiplegia and hemiparesis following cerebral infarction affecting unspecified side: Secondary | ICD-10-CM | POA: Insufficient documentation

## 2014-12-31 LAB — HEMOGLOBIN A1C
HEMOGLOBIN A1C: 5.5 % (ref 4.8–5.6)
MEAN PLASMA GLUCOSE: 111 mg/dL

## 2014-12-31 MED ORDER — LORAZEPAM 0.5 MG PO TABS
0.5000 mg | ORAL_TABLET | Freq: Two times a day (BID) | ORAL | Status: DC | PRN
  Administered 2014-12-31: 0.5 mg via ORAL
  Filled 2014-12-31: qty 1

## 2014-12-31 MED ORDER — NITROGLYCERIN 0.4 MG SL SUBL: 0.4000 mg | SUBLINGUAL_TABLET | SUBLINGUAL | Status: DC | PRN

## 2014-12-31 MED ORDER — PRAVASTATIN SODIUM 40 MG PO TABS
40.0000 mg | ORAL_TABLET | Freq: Every day | ORAL | Status: DC
  Administered 2014-12-31 – 2015-01-02 (×2): 40 mg via ORAL
  Filled 2014-12-31 (×2): qty 1

## 2014-12-31 MED ORDER — ASPIRIN 325 MG PO TABS
325.0000 mg | ORAL_TABLET | Freq: Every day | ORAL | Status: DC
  Administered 2015-01-02: 325 mg via ORAL
  Filled 2014-12-31: qty 1

## 2014-12-31 MED ORDER — METOPROLOL TARTRATE 50 MG PO TABS
50.0000 mg | ORAL_TABLET | Freq: Two times a day (BID) | ORAL | Status: DC
  Administered 2014-12-31 – 2015-01-02 (×4): 50 mg via ORAL
  Filled 2014-12-31 (×4): qty 1

## 2014-12-31 MED ORDER — VITAMIN D 1000 UNITS PO TABS
1000.0000 [IU] | ORAL_TABLET | Freq: Every day | ORAL | Status: DC
  Administered 2014-12-31 – 2015-01-02 (×2): 1000 [IU] via ORAL
  Filled 2014-12-31 (×2): qty 1

## 2014-12-31 MED ORDER — AMLODIPINE BESYLATE 10 MG PO TABS
10.0000 mg | ORAL_TABLET | Freq: Every day | ORAL | Status: DC
  Administered 2014-12-31 – 2015-01-02 (×2): 10 mg via ORAL
  Filled 2014-12-31 (×2): qty 1

## 2014-12-31 MED ORDER — VITAMIN E 180 MG (400 UNIT) PO CAPS
400.0000 [IU] | ORAL_CAPSULE | Freq: Every day | ORAL | Status: DC
  Administered 2015-01-02: 400 [IU] via ORAL
  Filled 2014-12-31 (×4): qty 1

## 2014-12-31 MED ORDER — VITAMIN D3 25 MCG (1000 UT) PO CAPS: 1.0000 | ORAL_CAPSULE | Freq: Every day | ORAL | Status: DC

## 2014-12-31 NOTE — Evaluation (Signed)
Occupational Therapy Evaluation Patient Details Name: [name] MRN: [id number] DOB: [birth year] Today's Date: 12/31/2014    History of Present Illness 74 yo male admitted with L hemiparesis dysarthria R gaze preference. NIH 11.  PMH: HTN, CAD s/p CABG, Carotid disease s/p R CEA, Etoh abuse hx, R THA, knee surg, foot surg   Clinical Impression   PT admitted with L hemiparesis s/p CVA. Pt currently with functional limitiations due to the deficits listed below (see OT problem list). PTA living at home with (A) for IADLs and some (A) to transfer on seat for bathing. Pt will benefit from skilled OT to increase their independence and safety with adls and balance to allow discharge SNF. Recommending SNF due to cogntiive deficits, balance deficits and decr caregiver support. Pt has friends that can provided PRN several days a week but otherwise will be home alone.   Friend and patient confirm that the two females that (A) patient are the only support system at this time for patient.      Follow Up Recommendations  SNF;Supervision/Assistance - 24 hour    Equipment Recommendations  Other (comment) (defer SNF)    Recommendations for Other Services       Precautions / Restrictions Precautions Precautions: Fall Restrictions Weight Bearing Restrictions: No      Mobility Bed Mobility Overal bed mobility: Needs Assistance Bed Mobility: Supine to Sit;Sit to Supine     Supine to sit: Max assist;HOB elevated Sit to supine: Max assist   General bed mobility comments: pt required (A) to pivot buttock around on bed surface with pad. pt unable to lift bil LE into the bed  Transfers                 General transfer comment: not attempted due to (A) at EOB and suspect +2 person (A) required    Balance Overall balance assessment: Needs assistance Sitting-balance support: Bilateral upper extremity supported;Feet supported Sitting balance-Leahy Scale: Fair                                      ADL Overall ADL's : Needs assistance/impaired Eating/Feeding: Minimal assistance;Bed level Eating/Feeding Details (indicate cue type and reason): friend feeding back on arrival and needed cues to allow L Ue feed himself                                   General ADL Comments: friend feeding patient on arrival and needed cues to allow patient to attempt to self feed. Pt L handed and able to hold fork. pt did have some deficits with getting lettuce on fork but able to successfully stab veggies on plate. Pt attempted EOB sitting with dynamic balance reaching     Vision Vision Assessment?: Yes Eye Alignment: Within Functional Limits Ocular Range of Motion: Within Functional Limits Tracking/Visual Pursuits: Able to track stimulus in all quads without difficulty Additional Comments: reports seeing objects not present on floor but where imperfections were located in floor - "get that pen" it was a crack . "look there was a pill" pt pointing to small paper on floor by trash can   Perception     Praxis      Pertinent Vitals/Pain Pain Assessment: No/denies pain     Hand Dominance Left   Extremity/Trunk Assessment Upper Extremity Assessment Upper Extremity Assessment: RUE deficits/detail  RUE Deficits / Details: AROM ~ 80 degrees shoulder flexion, facial grimace with AROM   Lower Extremity Assessment Lower Extremity Assessment: Defer to PT evaluation   Cervical / Trunk Assessment Cervical / Trunk Assessment: Kyphotic   Communication Communication Communication: No difficulties   Cognition Arousal/Alertness: Awake/alert Behavior During Therapy: Flat affect Overall Cognitive Status: Impaired/Different from baseline Area of Impairment: Orientation;Attention;Memory;Following commands;Safety/judgement;Awareness;Problem solving Orientation Level: Disoriented to;Place;Time;Situation Current Attention Level: Focused Memory: Decreased  short-term memory Following Commands: Follows one step commands with increased time Safety/Judgement: Decreased awareness of deficits Awareness: Intellectual Problem Solving: Slow processing General Comments: pt reports seeing small items on the floor that are not present "is that a pen?" pt pointing to a crack in the flooring. Friend reports "he is always doing that. Notices everything" pt unable to complete simple math. Pt and friend reports he manages only bills   General Comments       Exercises       Shoulder Instructions      Home Living Family/patient expects to be discharged to:: Private residence Living Arrangements: Alone Available Help at Discharge: Available PRN/intermittently;Friend(s) Type of Home: Other(Comment) (town home) Home Access: Stairs to enter Technical brewer of Steps: 3 Entrance Stairs-Rails: Right Home Layout: Multi-level;Bed/bath upstairs Alternate Level Stairs-Number of Steps: has stair lift   Bathroom Shower/Tub: Teacher, early years/pre: Standard     Home Equipment: Civil engineer, contracting;Wheelchair - [employer] - 2 wheels   Additional Comments: Pt has x2 male friends that help (A) with groceries , light cleaning and transportation. pt does not drive any more. pt has a male friend present that has known patient since 2002 [name]) and reports that "i promised his mother I would take care of him "  This friend is also the friend who he called for help and she told him to hang up and call 911. She reports the other friend that comes to visit does laundry and brings him things he isnt allowed to have such as cigarettes. patient states "thats what she thinks . I am not suppose to have"       Prior Functioning/Environment Level of Independence: Independent with assistive device(s)        Comments: pt lives alone but relies heavily on male friend (A) to remain in the home    OT Diagnosis: Generalized weakness;Cognitive deficits   OT  Problem List: Decreased strength;Decreased activity tolerance;Impaired balance (sitting and/or standing)   OT Treatment/Interventions: Self-care/ADL training;DME and/or AE instruction;Therapeutic activities;Cognitive remediation/compensation;Patient/family education;Balance training    OT Goals(Current goals can be found in the care plan section) Acute Rehab OT Goals Patient Stated Goal: to be able to do math like i could OT Goal Formulation: Patient unable to participate in goal setting Time For Goal Achievement: 01/14/15 Potential to Achieve Goals: Good  OT Frequency: Min 2X/week   Barriers to D/C: Decreased caregiver support          Co-evaluation              End of Session Nurse Communication: Mobility status;Precautions  Activity Tolerance: Patient tolerated treatment well Patient left: in bed;with call bell/phone within reach;with bed alarm set;with family/visitor present   Time: 1336-1401 OT Time Calculation (min): 25 min Charges:  OT General Charges $OT Visit: 1 Procedure OT Evaluation $Initial OT Evaluation Tier I: 1 Procedure OT Treatments $Self Care/Home Management : 8-22 mins G-Codes:    Peri Maris 01-21-2015, 3:14 PM   Jeri Modena   OTR/L Pager: [phone] Office: [phone] .

## 2014-12-31 NOTE — Progress Notes (Signed)
Stroke Team Progress Note  HISTORY  74 Y/O with a past medical history significant for HTN, CAD s/p CABG, carotid disease s/p right CEA, former alcohol abuse, brought to the ED via EMS as a code stroke due to acute onset of L hemiparesis, dysarthria, R gaze preference.   Patient friend is at the bedside and stated that [name] was normal when she talked to him on the phone around 10:30 today, but approximately 1 hour later he called her and she got concerned because he was slurring his words. EMS was called by the patient and they found him with with flaccid weakness left side, slurred speech, and gaze preference to the right. Upon arrival to the ED he was alert and awake with dense left hemiparesis, right gaze preference, and dysarthria. His left hemiparesis virtually resolved after returning from the CT suite but still with a high NIHSS 11.   SUBJECTIVE Neurologically stable. BP controlled.. Antigravity strength on L side.  Follows commands.   MRI scan shows a right insular infarct which is acute and also possible subacute left insular cortex infarct.  OBJECTIVE Most recent Vital Signs: Filed Vitals:   12/31/14 0900 12/31/14 1000 12/31/14 1100 12/31/14 1215  BP: 128/69 134/63 132/69 145/70  Pulse: 79 75 64 64  Temp:    98.9 F (37.2 C)  TempSrc:    Oral  Resp: 23 16 18    Height:      Weight:      SpO2: 95% 96% 95% 97%   CBG (last 3)  No results for input(s): GLUCAP in the last 72 hours.  IV Fluid Intake:      MEDICATIONS  . amLODipine  10 mg Oral Daily  . aspirin  325 mg Oral Daily  . cholecalciferol  1,000 Units Oral Daily  . metoprolol  50 mg Oral BID  . pantoprazole (PROTONIX) IV  40 mg Intravenous QHS  . pravastatin  40 mg Oral Daily  . vitamin E  400 Units Oral Daily   PRN:  acetaminophen **OR** [DISCONTINUED] acetaminophen, LORazepam, nitroGLYCERIN, senna-docusate  Diet:  Diet Heart Room service appropriate?: Yes; Fluid consistency:: Thin  liquids Activity:  up  with assistance DVT Prophylaxis:  scds followed by heparin sq  CLINICALLY SIGNIFICANT STUDIES Basic Metabolic Panel:   Recent Labs Lab 12/29/14 1238 12/29/14 1307  NA 142 143  K 4.1 4.2  CL 110 109  CO2 25  --   GLUCOSE 95 86  BUN 14 17  CREATININE 1.26* 1.10  CALCIUM 9.1  --    Liver Function Tests:   Recent Labs Lab 12/29/14 1238  AST 21  ALT 16*  ALKPHOS 89  BILITOT 0.5  PROT 6.8  ALBUMIN 3.5   CBC:   Recent Labs Lab 12/29/14 1238 12/29/14 1307  WBC 10.1  --   NEUTROABS 5.8  --   HGB 14.9 16.0  HCT 45.1 47.0  MCV 95.6  --   PLT 163  --    Coagulation:   Recent Labs Lab 12/29/14 1238  LABPROT 14.7  INR 1.13   Cardiac Enzymes: No results for input(s): CKTOTAL, CKMB, CKMBINDEX, TROPONINI in the last 168 hours. Urinalysis:   Recent Labs Lab 12/29/14 1805  COLORURINE YELLOW  LABSPEC 1.010  PHURINE 6.0  GLUCOSEU NEGATIVE  HGBUR TRACE*  BILIRUBINUR NEGATIVE  KETONESUR 15*  PROTEINUR NEGATIVE  NITRITE NEGATIVE  LEUKOCYTESUR NEGATIVE   Lipid Panel    Component Value Date/Time   CHOL 112 12/30/2014 0320   TRIG 114 12/30/2014  0320   HDL 30* 12/30/2014 0320   CHOLHDL 3.7 12/30/2014 0320   VLDL 23 12/30/2014 0320   LDLCALC 59 12/30/2014 0320   HgbA1C  Lab Results  Component Value Date   HGBA1C 5.5 12/30/2014    Urine Drug Screen:      Component Value Date/Time   LABOPIA NONE DETECTED 12/29/2014 1805   COCAINSCRNUR NONE DETECTED 12/29/2014 1805   LABBENZ NONE DETECTED 12/29/2014 1805   AMPHETMU NONE DETECTED 12/29/2014 1805   THCU NONE DETECTED 12/29/2014 1805   LABBARB NONE DETECTED 12/29/2014 1805    Alcohol Level:   Recent Labs Lab 12/29/14 1238  ETH <5    [name] Head Wo Contrast  12/30/2014  CLINICAL DATA:  Left hemi paresis. Right gaze preference. Dysarthria. EXAM: MRI HEAD WITHOUT CONTRAST MRA HEAD WITHOUT CONTRAST TECHNIQUE: Multiplanar, multiecho pulse sequences of the brain and surrounding structures were obtained  without intravenous contrast. Angiographic images of the head were obtained using MRA technique without contrast. COMPARISON:  CTA of the head and neck 12/29/2014. CT head without contrast 12/29/2014 FINDINGS: MRI HEAD FINDINGS Diffusion-weighted images demonstrate acute nonhemorrhagic infarct along the right insular cortex. There are 3 additional punctate foci of acute nonhemorrhagic infarction involving the posterior right frontal lobe. Two of these lesions involves the precentral gyrus. Focal diffusion abnormality is also present along the anterior left insular cortex and frontal lobe. The ADC map is more isointense with this lesion. T2 changes are more prominent in the left anterior insular lesion. The study is mildly degraded by patient motion. Moderate atrophy and mild periventricular white matter changes are present bilaterally. The ventricles are proportionate to the degree of atrophy. No significant extra-axial fluid collection is present. A remote lacunar infarct is present in the inferior right cerebellum. Flow is present in the major intracranial arteries. Bilateral lens replacements are present. The globes and orbits are intact. The paranasal sinuses and mastoid air cells are clear. Midline sagittal images are unremarkable. MRA HEAD FINDINGS The study is moderately degraded by patient motion. The internal carotid arteries are within normal limits from the high cervical segments through the ICA termini bilaterally. The A1 and M1 segments are normal. There is a high-grade stenosis within the superior right M2 division just beyond the bifurcation. There is moderate attenuation of distal small vessels bilaterally, left greater than right. The vertebral arteries are codominant. The left PICA origin is visualized and normal. The basilar artery is normal. Both posterior cerebral arteries originate from the basilar tip. IMPRESSION: 1. Acute nonhemorrhagic infarct involving the right insular cortex. 2.  Additional punctate areas of acute nonhemorrhagic infarct are present more superiorly in the right frontal lobe with at least 2 lesions along the precentral gyrus. 3. Slightly older acute/subacute nonhemorrhagic infarct involving the anterior left insular cortex and frontal lobe. 4. High-grade stenosis in the superior right M2 division. 5. No significant proximal stenosis, aneurysm, or branch vessel occlusion within the circle of Willis otherwise. 6. Moderate MCA small vessel disease bilaterally. 7. Moderate generalized atrophy at and periventricular white matter disease bilaterally consistent with chronic small vessel ischemia. Electronically Signed   By: San Morelle M.D.   On: 12/30/2014 14:53   Nicolas Brain Wo Contrast  12/30/2014  CLINICAL DATA:  Left hemi paresis. Right gaze preference. Dysarthria. EXAM: MRI HEAD WITHOUT CONTRAST MRA HEAD WITHOUT CONTRAST TECHNIQUE: Multiplanar, multiecho pulse sequences of the brain and surrounding structures were obtained without intravenous contrast. Angiographic images of the head were obtained using MRA technique without contrast. COMPARISON:  CTA of the head and neck 12/29/2014. CT head without contrast 12/29/2014 FINDINGS: MRI HEAD FINDINGS Diffusion-weighted images demonstrate acute nonhemorrhagic infarct along the right insular cortex. There are 3 additional punctate foci of acute nonhemorrhagic infarction involving the posterior right frontal lobe. Two of these lesions involves the precentral gyrus. Focal diffusion abnormality is also present along the anterior left insular cortex and frontal lobe. The ADC map is more isointense with this lesion. T2 changes are more prominent in the left anterior insular lesion. The study is mildly degraded by patient motion. Moderate atrophy and mild periventricular white matter changes are present bilaterally. The ventricles are proportionate to the degree of atrophy. No significant extra-axial fluid collection is present.  A remote lacunar infarct is present in the inferior right cerebellum. Flow is present in the major intracranial arteries. Bilateral lens replacements are present. The globes and orbits are intact. The paranasal sinuses and mastoid air cells are clear. Midline sagittal images are unremarkable. MRA HEAD FINDINGS The study is moderately degraded by patient motion. The internal carotid arteries are within normal limits from the high cervical segments through the ICA termini bilaterally. The A1 and M1 segments are normal. There is a high-grade stenosis within the superior right M2 division just beyond the bifurcation. There is moderate attenuation of distal small vessels bilaterally, left greater than right. The vertebral arteries are codominant. The left PICA origin is visualized and normal. The basilar artery is normal. Both posterior cerebral arteries originate from the basilar tip. IMPRESSION: 1. Acute nonhemorrhagic infarct involving the right insular cortex. 2. Additional punctate areas of acute nonhemorrhagic infarct are present more superiorly in the right frontal lobe with at least 2 lesions along the precentral gyrus. 3. Slightly older acute/subacute nonhemorrhagic infarct involving the anterior left insular cortex and frontal lobe. 4. High-grade stenosis in the superior right M2 division. 5. No significant proximal stenosis, aneurysm, or branch vessel occlusion within the circle of Willis otherwise. 6. Moderate MCA small vessel disease bilaterally. 7. Moderate generalized atrophy at and periventricular white matter disease bilaterally consistent with chronic small vessel ischemia. Electronically Signed   By: San Morelle M.D.   On: 12/30/2014 14:53    CT of the brain  Age indeterminate infarct involving the anterior left insular cortex and frontal operculum. Atherosclerotic calcifications are present in the left common carotid artery the left carotid bifurcation bilateral cavernous internal  carotid arteries without significant stenosis.  CT angiogram  Neck: Atherosclerotic calcifications are present in the left common carotid artery the left carotid bifurcation bilateral cavernous internal carotid arteries without significant stenosis.  Atherosclerotic calcifications at the origin of the left subclavian artery and right subclavian artery. There is no associated stenosis. CT angiogram : Atherosclerotic calcifications are present within the precavernous and cavernous internal carotid arteries bilaterally without significant stenosis. The A1 and M1 segments are normal. The anterior communicating artery is patent. The MCA bifurcations are intact. The ACA and MCA branch vessels are within normal limits. The vertebral arteries are codominant. The PICA origins are visualized and normal. The basilar artery is within normal limits. Both posterior cerebral arteries originate from the basilar tip. PCA branch vessels are intact. MRA of the brain  See CTA  Carotid Doppler  See CTA  2D Echocardiogram Left ventricle: The cavity size was normal. Systolic function was normal. The estimated ejection fraction was in the range of 55% to 60%. Wall motion was normal; there were no regional wall motion abnormalities. CXR  Not done  EKG  normal EKG,  normal sinus rhythm, unchanged from previous tracings. For complete results please see formal report.   Therapy Recommendations pending     Neurological Examination Mental Status: Alert, oriented, to name, slight dysarthria.  Cranial Nerves: II: Discs flat bilaterally; Visual fields grossly normal, pupils equal, round, reactive to light and accommodation III,IV, VI: ptosis not present, extra-ocular motions intact bilaterally V,VII: smile symmetric, facial light touch sensation normal bilaterally VIII: hearing normal bilaterally IX,X: gag reflex present XI: bilateral shoulder shrug XII: midline tongue extension Motor: Right : Upper  extremity   5/5    Left:     Upper extremity   4/5  Lower extremity   5/5     Lower extremity   4/5 Tone and bulk:normal tone throughout; no atrophy noted Sensory: Pinprick and light touch intact throughout, bilaterally Deep Tendon Reflexes: 1+ and symmetric throughout Plantars: Right: downgoing   Left: downgoing Cerebellar: normal finger-to-nose but slow Gait: not tested         LDL 59     Hospital day # 2  TREATMENT/PLAN 74 Y/O with a past medical history significant for HTN, CAD s/p CABG, carotid disease s/p right CEA, former alcohol abuse, brought to the ED via EMS as a code stroke due to acute onset of L hemiparesis, dysarthria, R gaze preference.   MRI shows a right insular cortex infarct likely embolic etiology to be determined. Symptoms much improved, following commands 4/5 b/l L upper and LE.  Plan mobilize out of bed. Physical occupational therapy and rehabilitation consults. Transfer to the neurology floor when bed available. He remains at risk for neurological worsening, recurrent stroke, TIA. Continue aspirin and aggressive risk factor modification. Plan TEE and loop recorder     SIGNED SETHI,PRAMOD    To contact Stroke Continuity provider, please refer to [email or web address] After hours, contact General Neurology

## 2014-12-31 NOTE — Progress Notes (Signed)
    [facility] has been requested to perform a transesophageal echocardiogram on [name]  for cerebrovascular accident.  After careful review of history and examination, the risks and benefits of transesophageal echocardiogram have been explained including risks of esophageal damage, perforation (1:10,000 risk), bleeding, pharyngeal hematoma as well as other potential complications associated with conscious sedation including aspiration, arrhythmia, respiratory failure and death. Alternatives to treatment were discussed, questions were answered. Patient is willing to proceed.   Hgb stable at 14.9. Platelets stable at 163. BP has been 119/60 - 157/88 in the past 24 hours. No requirement for pressors.  Erma Heritage,  12/31/2014 3:48 PM

## 2014-12-31 NOTE — Evaluation (Signed)
Physical Therapy Evaluation Patient Details Name: [name] MRN: [id number] DOB: 04-Sep-1940 Today's Date: 12/31/2014   History of Present Illness  74 yo male admitted with L hemiparesis dysarthria R gaze preference. NIH 11.  PMH: HTN, CAD s/p CABG, Carotid disease s/p R CEA, Etoh abuse hx, R THA, knee surg, foot surg  Clinical Impression  Pt requires A for all aspects of mobility and lives alone.  Pt's friend arrived towards the end of session and she seems to be pt's primary support system and states pt has been needing more and more help.  Feel pt would benefit from SNF level of care at D/C to maximize independence and decrease burden of care.  Will continue to follow.      Follow Up Recommendations SNF    Equipment Recommendations  None recommended by PT    Recommendations for Other Services       Precautions / Restrictions Precautions Precautions: Fall Restrictions Weight Bearing Restrictions: No      Mobility  Bed Mobility Overal bed mobility: Needs Assistance Bed Mobility: Supine to Sit;Sit to Supine     Supine to sit: Mod assist;HOB elevated Sit to supine: Max assist   General bed mobility comments: pt needs A for bringing trunk up to sitting and bringing LEs off of EOB.  Increased A for return to bed.    Transfers Overall transfer level: Needs assistance Equipment used: Rolling walker (2 wheeled) Transfers: Sit to/from Stand Sit to Stand: Mod assist         General transfer comment: A with power up to standing and maintaining balance.  Also needs A controlling descent to sitting.    Ambulation/Gait Ambulation/Gait assistance: Mod assist Ambulation Distance (Feet): 2 Feet Assistive device: Rolling walker (2 wheeled) Gait Pattern/deviations: Step-to pattern;Decreased stride length;Shuffle;Wide base of support     General Gait Details: pt maintains a wide BOS and rocks back and forth in almost a waddling shuffle hardly advancing his foot even an  inch.  When asked if this is normal gait for pt, both pt and friend who arrived during gait stated this is normal since his hip replacement.    Stairs            Wheelchair Mobility    Modified Rankin (Stroke Patients Only)       Balance Overall balance assessment: Needs assistance Sitting-balance support: Bilateral upper extremity supported;Feet supported Sitting balance-Leahy Scale: Fair     Standing balance support: Bilateral upper extremity supported;During functional activity Standing balance-Leahy Scale: Poor                               Pertinent Vitals/Pain Pain Assessment: No/denies pain    Home Living Family/patient expects to be discharged to:: Private residence Living Arrangements: Alone Available Help at Discharge: Available PRN/intermittently;Friend(s) Type of Home: Other(Comment) (town home) Home Access: Stairs to enter Entrance Stairs-Rails: Right Entrance Stairs-Number of Steps: 3 Home Layout: Multi-level;Bed/bath upstairs Home Equipment: Shower seat;Wheelchair - [employer] - 2 wheels Additional Comments: Pt has x2 male friends that help (A) with groceries , light cleaning and transportation. pt does not drive any more. pt has a male friend present that has known patient since 2002 [name]) and reports that "i promised his mother I would take care of him "  This friend is also the friend who he called for help and she told him to hang up and call 911. She reports the other friend  that comes to visit does laundry and brings him things he isnt allowed to have such as cigarettes. patient states "thats what she thinks . I am not suppose to have"     Prior Function Level of Independence: Independent with assistive device(s)         Comments: pt lives alone but relies heavily on male friend (A) to remain in the home     Hand Dominance   Dominant Hand: Left    Extremity/Trunk Assessment   Upper Extremity Assessment: Defer to OT  evaluation RUE Deficits / Details: AROM ~ 80 degrees shoulder flexion, facial grimace with AROM         Lower Extremity Assessment: LLE deficits/detail   LLE Deficits / Details: Grossly 4-/5, but pt also having difficulties following directions for MMT.    Cervical / Trunk Assessment: Kyphotic  Communication   Communication: No difficulties  Cognition Arousal/Alertness: Awake/alert Behavior During Therapy: Flat affect Overall Cognitive Status: Impaired/Different from baseline Area of Impairment: Orientation;Attention;Memory;Following commands;Safety/judgement;Awareness;Problem solving Orientation Level: Disoriented to;Place;Time;Situation Current Attention Level: Focused Memory: Decreased short-term memory Following Commands: Follows one step commands with increased time Safety/Judgement: Decreased awareness of deficits Awareness: Intellectual Problem Solving: Slow processing General Comments: pt with difficulty staying on task with PT and at one point picked up TV controls and started pushing buttons.  when asked what he was doing he stated he was "flicking through the channels" despite being half way out of bed and in the middle of coming to sit at EOB.  pt also having difficulty using controls and pushing the wrong buttons, despite being able to read them.  pt states he is "flaky" at baseline.      General Comments General comments (skin integrity, edema, etc.): decr bed mobility so higher risk for skin break down    Exercises        Assessment/Plan    PT Assessment Patient needs continued PT services  PT Diagnosis Difficulty walking;Generalized weakness   PT Problem List Decreased strength;Decreased activity tolerance;Decreased balance;Decreased mobility;Decreased coordination;Decreased cognition;Decreased knowledge of use of DME;Decreased safety awareness  PT Treatment Interventions DME instruction;Stair training;Gait training;Functional mobility training;Therapeutic  activities;Therapeutic exercise;Balance training;Neuromuscular re-education;Cognitive remediation;Patient/family education   PT Goals (Current goals can be found in the Care Plan section) Acute Rehab PT Goals Patient Stated Goal: Go home PT Goal Formulation: With patient Time For Goal Achievement: 01/14/15 Potential to Achieve Goals: Good    Frequency Min 3X/week   Barriers to discharge Decreased caregiver support      Co-evaluation               End of Session Equipment Utilized During Treatment: Gait belt Activity Tolerance: Patient limited by fatigue Patient left: in bed;with call bell/phone within reach;with bed alarm set;with family/visitor present Nurse Communication: Mobility status         Time: [id number] PT Time Calculation (min) (ACUTE ONLY): 29 min   Charges:   PT Evaluation $Initial PT Evaluation Tier I: 1 Procedure PT Treatments $Gait Training: 8-22 mins   PT G CodesCatarina Hartshorn, [place] 12/31/2014, 3:43 PM

## 2014-12-31 NOTE — Clinical Social Work Note (Signed)
CSW received consult for patient to go to SNF, CSW to continue to follow patient's progress, and formal assessment to follow.  [name]. Nathalie, MSW, [place] 12/31/2014 6:45 PM

## 2014-12-31 NOTE — Consult Note (Signed)
Physical Medicine and Rehabilitation Consult  Reason for Consult: Left sided weakness, confusion, right gaze preference Referring Physician: Dr. Leonie Man   HPI: [name] is a 74 y.o. male with history of HTN, CAD, CAS s/p R-CEA, prior ETOH abuse who was admitted on 12/29/14 with acute onset of left sided weakness, difficulty speaking and right gaze preference. CT head done revealing showing age indeterminate left frontal lobe infarct. Patient's left sided weakness almost completely resolved and he was treated with tPA due to NIHSS 11. He developed expressive aphasia. CTA head/neck showed age indeterminate infarct involving the anterior left insular cortex and frontal operculum, multiple atherosclerotic calcifications and multilevel spondylosis of cervical spine with foraminal narrowing. ASA increased to 325 mg daily and follow up MRI brain done revealing acute non-hemorrhagic infarct right insular cortex with additional punctate areas right frontal lobe and slightly older acute/subacute non-hemorrhagic infarcts anterior left insular and frontal lobe.  MRA brain with high grade stenosis superior R-M2 division. 2D echo with EF 55-60% and no wall abnormality.   Review of Systems  HENT: Negative for hearing loss.   Eyes: Negative for blurred vision and double vision.  Respiratory: Positive for cough. Negative for sputum production and shortness of breath.   Cardiovascular: Negative for chest pain, palpitations and leg swelling.  Gastrointestinal: Positive for constipation. Negative for heartburn, nausea and abdominal pain.  Genitourinary: Negative for urgency and frequency.  Musculoskeletal: Positive for back pain (radiating to RLE with occasionally give away) and falls (frequent falls due to dizziness as well as LE weakness).  Neurological: Positive for dizziness, speech change, focal weakness and headaches (since stroke).  Psychiatric/Behavioral: Negative for depression. The patient  is nervous/anxious.   All other systems reviewed and are negative.   Past Medical History  Diagnosis Date  . Carotid arterial disease ([place])     s/p RCEA 11/2008 (Note: hx of negative cardiac nuc 03/2009)  . Hypertension   . Colonic polyp     cscopes 08/18/05 tics,polyps.Marland Kitchenall hyperplastic  . GERD (gastroesophageal reflux disease)     Barrets Esop. (per bx 08/2005) and HH f/u Dr.Schooler  . Anal warts   . Anal fissure   . Hemorrhoids   . 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 Panic attack   . Osteoarthritis   . Depression   . H/O ETOH abuse     Former. Quit ~2012  . BPH (benign prostatic hyperplasia)     s/p surgery  . Anxiety and depression 02/14/2009    Qualifier: Diagnosis of  By: Larose Kells MD, [place]     Past Surgical History  Procedure Laterality Date  . Right carotid endartectomy with dacron patch angioplasty  11/2008  . Appendectomy    . Knee surgery    . Foot surgery      fx  . Total hip arthroplasty  2005    R due to OA  . Skin tags excised    . Transurethral resection of prostate    . Cataract extraction      wears contact lens L, good vision  . Coronary artery bypass graft  03/23/2011    Procedure: CORONARY ARTERY BYPASS GRAFTING (CABG);  Surgeon: Melrose Nakayama, MD;  Location: [facility];  Service: Open Heart Surgery;  Laterality: N/A;  CABG x four using bilateral greater saphenous vein, harvested endoscopically  . Left heart catheterization with coronary angiogram N/A 03/19/2011    Procedure: LEFT HEART CATHETERIZATION  WITH CORONARY ANGIOGRAM;  Surgeon: Thayer Headings, MD;  Location: [facility] CATH LAB;  Service: Cardiovascular;  Laterality: N/A;   Family History  Problem Relation Age of Onset  . Lung cancer Father     mets to brain- deceased  . Arthritis Mother   . Hypertension Mother   . Coronary artery disease Neg Hx   . Colon cancer Neg Hx   . Prostate cancer Neg Hx   . Diabetes Paternal Aunt   . Stroke Paternal Grandfather     Social History:  Single. Retired Hotel manager. He lives alone in two level townhouse--has chair lift to get to second level. Does not have bathroom on 1st level.  Uses walker in the home and cane out of home. Needed assistance with ADLs. Does not drive.  Has friends who help with errands but no local family. He  reports that he has been smoking Cigarettes.  He has a 52 pack-year smoking history. He has never used smokeless tobacco. He reports that he does not drink alcohol or use illicit drugs.   Allergies  Allergen Reactions  . Sertraline Hcl     REACTION: heart palpitation     Medications Prior to Admission  Medication Sig Dispense Refill  . amLODipine (NORVASC) 10 MG tablet TAKE 1 TABLET BY MOUTH DAILY 90 tablet 2  . aspirin 81 MG tablet Take 81 mg by mouth daily.    . Cholecalciferol (VITAMIN D3) 1000 UNITS CAPS Take 1 capsule by mouth daily. Takes 1 2000 unit capsule daily.    Marland Kitchen LORazepam (ATIVAN) 0.5 MG tablet Take 1 tablet (0.5 mg total) by mouth 2 (two) times daily as needed for anxiety. 60 tablet 0  . metoprolol (LOPRESSOR) 50 MG tablet Take 1 tablet (50 mg total) by mouth 2 (two) times daily. 60 tablet 11  . omeprazole (PRILOSEC) 40 MG capsule Take 1 capsule (40 mg total) by mouth daily. 90 capsule 3  . pravastatin (PRAVACHOL) 40 MG tablet Take 1 tablet (40 mg total) by mouth daily. 90 tablet 1  . vitamin E 400 UNIT capsule Take 400 Units by mouth daily.    . nitroGLYCERIN (NITROSTAT) 0.4 MG SL tablet Place 1 tablet (0.4 mg total) under the tongue every 5 (five) minutes as needed for chest pain. 90 tablet 3    Home: Home Living Family/patient expects to be discharged to:: Assisted living Living Arrangements: Alone  Functional History:   Functional Status:  Mobility:          ADL:    Cognition: Cognition Orientation Level: Oriented to person, Oriented to time, Disoriented to place, Disoriented to situation    Blood pressure 128/69, pulse 79, temperature 98 F (36.7  C), temperature source Oral, resp. rate 23, height 5\' 11"  (1.803 m), weight 90.8 kg (200 lb 2.8 oz), SpO2 95 %. Physical Exam  Nursing note and vitals reviewed. Constitutional: He appears well-developed and well-nourished.  HENT:  Head: Normocephalic and atraumatic.  Mouth/Throat: Oropharynx is clear and moist.  Eyes: Conjunctivae and EOM are normal. Pupils are equal, round, and reactive to light. Right eye exhibits no discharge. Left eye exhibits no discharge.  Neck: Normal range of motion. Neck supple.  Cardiovascular: Normal rate and regular rhythm.   Respiratory: Effort normal and breath sounds normal. He has no wheezes. He exhibits no tenderness.  Congested cough noted.  GI: Soft. Bowel sounds are normal. He exhibits no distension. There is no tenderness.  Musculoskeletal: He exhibits no edema or tenderness.  Neurological: He is alert.  He has normal reflexes.  Poor postural reflexes.  Left inattention and was easily distracted by sounds outside.  Initially thought he was at [facility] in [facility] but able to correct with cues. He was able to state date/city/state/Pres.  Able to follow one and two step commands.   Has sensory deficits RLE.  Lacks insight and awareness of current deficits. Right-sided facial weakness:  LUE: 4-/5 proximal to distal RUE: 4/5 proximal to distal,  LLE: hip flexion 3/5, ankle dorsi/plantar flexion 4/5 RUE: Hip flexion 4/5, ankle dorsi/plantar flexion 5/5  Skin: Skin is warm and dry.  Psychiatric: His affect is blunt. His speech is delayed. He is slowed. He is inattentive.    No results found for this or any previous visit (from the past 24 hour(s)). Ct Angio Head W/cm &/or Wo Cm  12/29/2014  CLINICAL DATA:  Sudden onset of expressive aphasia. Abnormal CT of the head. Left-sided weakness and gaze preference to the right. Left hemiparesis has near completely resolved. EXAM: CT ANGIOGRAPHY HEAD AND NECK TECHNIQUE: Multidetector CT imaging of the head and neck was  performed using the standard protocol during bolus administration of intravenous contrast. Multiplanar CT image reconstructions and MIPs were obtained to evaluate the vascular anatomy. Carotid stenosis measurements (when applicable) are obtained utilizing NASCET criteria, using the distal internal carotid diameter as the denominator. CONTRAST:  27mL OMNIPAQUE IOHEXOL 350 MG/ML SOLN COMPARISON:  CT head without contrast from the same day. MRI brain 11/19/2008 FINDINGS: CT HEAD Brain: Age indeterminate infarct is again noted in the anterior left frontal lobe and operculum. Moderate atrophy and diffuse white matter disease is present. The ventricles are proportionate to the degree of atrophy. No significant extra-axial fluid collection present. Calvarium and skull base: The calvarium is intact. The skullbase is unremarkable. Paranasal sinuses: Mucosal thickening polypoid changes are noted in the posterior maxillary sinuses bilaterally. Paranasal sinuses are otherwise clear. There is some fluid in the mastoid air cells bilaterally. No obstructing nasopharyngeal lesion is evident. Orbits: Bilateral lens replacements are present. The globes and orbits are otherwise intact. CTA NECK Aortic arch: A 3 vessel arch configuration is present. Atherosclerotic changes are noted at the origin of the left subclavian artery without a significant stenosis relative to the more distal vessel. Right carotid system: The right common carotid artery is tortuous without a significant focal stenosis. Atherosclerotic irregularity is present with an the endarterectomy. There is no focal stenosis relative to the more distal vessel. The cervical right ICA is otherwise normal. Left carotid system: The left common carotid artery demonstrates atherosclerotic calcifications medially without a significant stenosis relative to the more distal vessel. The left carotid bifurcation demonstrates more dense calcifications without a significant stenosis  relative to the more distal vessel. The cervical left ICA is otherwise normal. Vertebral arteries:The vertebral arteries both originate from the subclavian arteries. Proximal calcifications are present within the right subclavian artery without a significant stenosis. The vertebral arteries are codominant. There is no significant stenosis within either vertebral artery in the neck. Skeleton: Multilevel endplate degenerative changes are present in the cervical spine. Degenerative anterolisthesis as present C3-4 and C4-5. Vertebral body heights and alignment are otherwise normal. Osseous foraminal narrowing is present at multiple levels, most severe on the right at C5-6. Median sternotomy is noted. There is mild rightward curvature of the cervical spine. Other neck: No focal mucosal or submucosal lesions are present. Thyroid is within limits. There is no significant cervical adenopathy. The salivary glands are intact. CTA HEAD Anterior circulation: Atherosclerotic calcifications  are present within the precavernous and cavernous internal carotid arteries bilaterally without significant stenosis. The A1 and M1 segments are normal. The anterior communicating artery is patent. The MCA bifurcations are intact. The ACA and MCA branch vessels are within normal limits. Posterior circulation: The vertebral arteries are codominant. The PICA origins are visualized and normal. The basilar artery is within normal limits. Both posterior cerebral arteries originate from the basilar tip. PCA branch vessels are intact. Venous sinuses: The dural sinuses are patent. The right transverse sinus is dominant. The straight sinus and deep cerebral veins are intact. The cortical veins are unremarkable. Anatomic variants: None Delayed phase: The postcontrast images demonstrate no pathologic enhancement. IMPRESSION: 1. Age indeterminate infarct involving the anterior left insular cortex and frontal operculum. 2. Moderate atrophy and white matter  disease. 3. No other acute cortical infarct. 4. Atherosclerotic calcifications are present in the left common carotid artery the left carotid bifurcation bilateral cavernous internal carotid arteries without significant stenosis. 5. Atherosclerotic calcifications at the origin of the left subclavian artery and right subclavian artery. There is no associated stenosis. 6. Multilevel spondylosis of the cervical spine with foraminal narrowing at multiple levels. Electronically Signed   By: San Morelle M.D.   On: 12/29/2014 14:45   Ct Head Wo Contrast  12/29/2014  CLINICAL DATA:  Code stroke, sudden onset of slurred speech and LEFT-sided weakness, RIGHT gaze preference, history carotid artery disease, hypertension, smoker EXAM: CT HEAD WITHOUT CONTRAST TECHNIQUE: Contiguous axial images were obtained from the base of the skull through the vertex without intravenous contrast. COMPARISON:  11/16/2008; correlation MR/MRA brain 11/19/2008 FINDINGS: Generalized atrophy. Diffuse dilatation of the ventricular system not significantly changed. No midline shift or mass effect. Small vessel chronic ischemic changes of deep cerebral white matter. Question tiny old deep infarcts in the cerebellar hemispheres bilaterally. Area of low attenuation identified at the lateral LEFT frontal region compatible with age-indeterminate infarction. No intracranial hemorrhage, mass lesion, or evidence of additional acute infarction. No extra-axial fluid collections. Atherosclerotic calcifications bilaterally at the carotid siphons. Bones and sinuses unremarkable. IMPRESSION: Atrophy with small vessel chronic ischemic changes. Age-indeterminate infarct LEFT frontal region. Questionable tiny lacunar infarcts at the cerebellar hemispheres. No intracranial hemorrhage. Findings called to Dr. Armida Sans on 12/29/2014 at 1309 hour. Electronically Signed   By: Lavonia Dana M.D.   On: 12/29/2014 13:09   Ct Angio Neck W/cm &/or Wo/cm  12/29/2014   CLINICAL DATA:  Sudden onset of expressive aphasia. Abnormal CT of the head. Left-sided weakness and gaze preference to the right. Left hemiparesis has near completely resolved. EXAM: CT ANGIOGRAPHY HEAD AND NECK TECHNIQUE: Multidetector CT imaging of the head and neck was performed using the standard protocol during bolus administration of intravenous contrast. Multiplanar CT image reconstructions and MIPs were obtained to evaluate the vascular anatomy. Carotid stenosis measurements (when applicable) are obtained utilizing NASCET criteria, using the distal internal carotid diameter as the denominator. CONTRAST:  82mL OMNIPAQUE IOHEXOL 350 MG/ML SOLN COMPARISON:  CT head without contrast from the same day. MRI brain 11/19/2008 FINDINGS: CT HEAD Brain: Age indeterminate infarct is again noted in the anterior left frontal lobe and operculum. Moderate atrophy and diffuse white matter disease is present. The ventricles are proportionate to the degree of atrophy. No significant extra-axial fluid collection present. Calvarium and skull base: The calvarium is intact. The skullbase is unremarkable. Paranasal sinuses: Mucosal thickening polypoid changes are noted in the posterior maxillary sinuses bilaterally. Paranasal sinuses are otherwise clear. There is some fluid in the  mastoid air cells bilaterally. No obstructing nasopharyngeal lesion is evident. Orbits: Bilateral lens replacements are present. The globes and orbits are otherwise intact. CTA NECK Aortic arch: A 3 vessel arch configuration is present. Atherosclerotic changes are noted at the origin of the left subclavian artery without a significant stenosis relative to the more distal vessel. Right carotid system: The right common carotid artery is tortuous without a significant focal stenosis. Atherosclerotic irregularity is present with an the endarterectomy. There is no focal stenosis relative to the more distal vessel. The cervical right ICA is otherwise normal.  Left carotid system: The left common carotid artery demonstrates atherosclerotic calcifications medially without a significant stenosis relative to the more distal vessel. The left carotid bifurcation demonstrates more dense calcifications without a significant stenosis relative to the more distal vessel. The cervical left ICA is otherwise normal. Vertebral arteries:The vertebral arteries both originate from the subclavian arteries. Proximal calcifications are present within the right subclavian artery without a significant stenosis. The vertebral arteries are codominant. There is no significant stenosis within either vertebral artery in the neck. Skeleton: Multilevel endplate degenerative changes are present in the cervical spine. Degenerative anterolisthesis as present C3-4 and C4-5. Vertebral body heights and alignment are otherwise normal. Osseous foraminal narrowing is present at multiple levels, most severe on the right at C5-6. Median sternotomy is noted. There is mild rightward curvature of the cervical spine. Other neck: No focal mucosal or submucosal lesions are present. Thyroid is within limits. There is no significant cervical adenopathy. The salivary glands are intact. CTA HEAD Anterior circulation: Atherosclerotic calcifications are present within the precavernous and cavernous internal carotid arteries bilaterally without significant stenosis. The A1 and M1 segments are normal. The anterior communicating artery is patent. The MCA bifurcations are intact. The ACA and MCA branch vessels are within normal limits. Posterior circulation: The vertebral arteries are codominant. The PICA origins are visualized and normal. The basilar artery is within normal limits. Both posterior cerebral arteries originate from the basilar tip. PCA branch vessels are intact. Venous sinuses: The dural sinuses are patent. The right transverse sinus is dominant. The straight sinus and deep cerebral veins are intact. The  cortical veins are unremarkable. Anatomic variants: None Delayed phase: The postcontrast images demonstrate no pathologic enhancement. IMPRESSION: 1. Age indeterminate infarct involving the anterior left insular cortex and frontal operculum. 2. Moderate atrophy and white matter disease. 3. No other acute cortical infarct. 4. Atherosclerotic calcifications are present in the left common carotid artery the left carotid bifurcation bilateral cavernous internal carotid arteries without significant stenosis. 5. Atherosclerotic calcifications at the origin of the left subclavian artery and right subclavian artery. There is no associated stenosis. 6. Multilevel spondylosis of the cervical spine with foraminal narrowing at multiple levels. Electronically Signed   By: San Morelle M.D.   On: 12/29/2014 14:45   Mr Jodene Nam Head Wo Contrast  12/30/2014  CLINICAL DATA:  Left hemi paresis. Right gaze preference. Dysarthria. EXAM: MRI HEAD WITHOUT CONTRAST MRA HEAD WITHOUT CONTRAST TECHNIQUE: Multiplanar, multiecho pulse sequences of the brain and surrounding structures were obtained without intravenous contrast. Angiographic images of the head were obtained using MRA technique without contrast. COMPARISON:  CTA of the head and neck 12/29/2014. CT head without contrast 12/29/2014 FINDINGS: MRI HEAD FINDINGS Diffusion-weighted images demonstrate acute nonhemorrhagic infarct along the right insular cortex. There are 3 additional punctate foci of acute nonhemorrhagic infarction involving the posterior right frontal lobe. Two of these lesions involves the precentral gyrus. Focal diffusion abnormality is also present  along the anterior left insular cortex and frontal lobe. The ADC map is more isointense with this lesion. T2 changes are more prominent in the left anterior insular lesion. The study is mildly degraded by patient motion. Moderate atrophy and mild periventricular white matter changes are present bilaterally. The  ventricles are proportionate to the degree of atrophy. No significant extra-axial fluid collection is present. A remote lacunar infarct is present in the inferior right cerebellum. Flow is present in the major intracranial arteries. Bilateral lens replacements are present. The globes and orbits are intact. The paranasal sinuses and mastoid air cells are clear. Midline sagittal images are unremarkable. MRA HEAD FINDINGS The study is moderately degraded by patient motion. The internal carotid arteries are within normal limits from the high cervical segments through the ICA termini bilaterally. The A1 and M1 segments are normal. There is a high-grade stenosis within the superior right M2 division just beyond the bifurcation. There is moderate attenuation of distal small vessels bilaterally, left greater than right. The vertebral arteries are codominant. The left PICA origin is visualized and normal. The basilar artery is normal. Both posterior cerebral arteries originate from the basilar tip. IMPRESSION: 1. Acute nonhemorrhagic infarct involving the right insular cortex. 2. Additional punctate areas of acute nonhemorrhagic infarct are present more superiorly in the right frontal lobe with at least 2 lesions along the precentral gyrus. 3. Slightly older acute/subacute nonhemorrhagic infarct involving the anterior left insular cortex and frontal lobe. 4. High-grade stenosis in the superior right M2 division. 5. No significant proximal stenosis, aneurysm, or branch vessel occlusion within the circle of Willis otherwise. 6. Moderate MCA small vessel disease bilaterally. 7. Moderate generalized atrophy at and periventricular white matter disease bilaterally consistent with chronic small vessel ischemia. Electronically Signed   By: San Morelle M.D.   On: 12/30/2014 14:53   Mr Brain Wo Contrast  12/30/2014  CLINICAL DATA:  Left hemi paresis. Right gaze preference. Dysarthria. EXAM: MRI HEAD WITHOUT CONTRAST MRA  HEAD WITHOUT CONTRAST TECHNIQUE: Multiplanar, multiecho pulse sequences of the brain and surrounding structures were obtained without intravenous contrast. Angiographic images of the head were obtained using MRA technique without contrast. COMPARISON:  CTA of the head and neck 12/29/2014. CT head without contrast 12/29/2014 FINDINGS: MRI HEAD FINDINGS Diffusion-weighted images demonstrate acute nonhemorrhagic infarct along the right insular cortex. There are 3 additional punctate foci of acute nonhemorrhagic infarction involving the posterior right frontal lobe. Two of these lesions involves the precentral gyrus. Focal diffusion abnormality is also present along the anterior left insular cortex and frontal lobe. The ADC map is more isointense with this lesion. T2 changes are more prominent in the left anterior insular lesion. The study is mildly degraded by patient motion. Moderate atrophy and mild periventricular white matter changes are present bilaterally. The ventricles are proportionate to the degree of atrophy. No significant extra-axial fluid collection is present. A remote lacunar infarct is present in the inferior right cerebellum. Flow is present in the major intracranial arteries. Bilateral lens replacements are present. The globes and orbits are intact. The paranasal sinuses and mastoid air cells are clear. Midline sagittal images are unremarkable. MRA HEAD FINDINGS The study is moderately degraded by patient motion. The internal carotid arteries are within normal limits from the high cervical segments through the ICA termini bilaterally. The A1 and M1 segments are normal. There is a high-grade stenosis within the superior right M2 division just beyond the bifurcation. There is moderate attenuation of distal small vessels bilaterally, left greater  than right. The vertebral arteries are codominant. The left PICA origin is visualized and normal. The basilar artery is normal. Both posterior cerebral  arteries originate from the basilar tip. IMPRESSION: 1. Acute nonhemorrhagic infarct involving the right insular cortex. 2. Additional punctate areas of acute nonhemorrhagic infarct are present more superiorly in the right frontal lobe with at least 2 lesions along the precentral gyrus. 3. Slightly older acute/subacute nonhemorrhagic infarct involving the anterior left insular cortex and frontal lobe. 4. High-grade stenosis in the superior right M2 division. 5. No significant proximal stenosis, aneurysm, or branch vessel occlusion within the circle of Willis otherwise. 6. Moderate MCA small vessel disease bilaterally. 7. Moderate generalized atrophy at and periventricular white matter disease bilaterally consistent with chronic small vessel ischemia. Electronically Signed   By: San Morelle M.D.   On: 12/30/2014 14:53    Assessment/Plan: Diagnosis: acute non-hemorrhagic infarct right insular cortex with additional punctate areas right frontal lobe and slightly older acute/subacute non-hemorrhagic infarcts anterior left insular and frontal lobe Labs and images independently reviewed.  Records reviewed and summated above. Stroke: Continue secondary stroke prophylaxis and Risk Factor Modification listed below:   Antiplatelet therapy Blood Pressure Management:  Continue current medication with prn's with permisive HTN per primary team Statin Agent Diabetes management:  Tobacco abuse: Counsel  1. Does the need for close, 24 hr/day medical supervision in concert with the patient's rehab needs make it unreasonable for this patient to be served in a less intensive setting? Yes  2. Co-Morbidities requiring supervision/potential complications: HTN (monitor and provide prns in accordance with increased physical exertion and pain), CAD (cont meds), CAS s/p R-CEA, prior ETOH abuse (cont to monitor), tachypnea (monitor with increased physical activity), OA (ensure pain does not limit therapies), anxiety  with depression (ensure anxiety and resulting apprehension do not limit functional progress; consider prn medications if warranted), BPH (monitor urinary output) 3. Due to bladder management, disease management, medication administration and patient education, does the patient require 24 hr/day rehab nursing? Yes 4. Does the patient require coordinated care of a physician, rehab nurse, PT (1-2 hrs/day, 5 days/week), OT (1-2 hrs/day, 5 days/week) and SLP (1-2 hrs/day, 5 days/week) to address physical and functional deficits in the context of the above medical diagnosis(es)? Yes Addressing deficits in the following areas: balance, endurance, locomotion, strength, transferring, bowel/bladder control, bathing, dressing, grooming, toileting, cognition and psychosocial support 5. Can the patient actively participate in an intensive therapy program of at least 3 hrs of therapy per day at least 5 days per week? Potentially 6. The potential for patient to make measurable gains while on inpatient rehab is excellent 7. Anticipated functional outcomes upon discharge from inpatient rehab are Undetermined at present  with PT, Undetermined at present with OT, Undetermined at present with SLP. 8. Estimated rehab length of stay to reach the above functional goals undetermined of present  9. Does the patient have adequate social supports and living environment to accommodate these discharge functional goals? No 10. Anticipated D/C setting: Other 11. Anticipated post D/C treatments: HH therapy and Home excercise program 12. Overall Rehab/Functional Prognosis: good  RECOMMENDATIONS: This patient's condition is appropriate for continued rehabilitative care in the following setting: Patient has not been seen by therapies due to being on strict bedrest yesterday. In addition, patient states he needed assistance with some ADLs prior to admission and will unlikely be able to achieve a independent level of functioning after a  short IR[facility] stay.  The patient will likely need SNF, however will  await therapy evaluations Patient has agreed to participate in recommended program. Potentially Note that insurance prior authorization may be required for reimbursement for recommended care.  Comment: Rehab Admissions Coordinator to follow up.  Delice Lesch, MD 12/31/2014

## 2015-01-01 ENCOUNTER — Encounter (HOSPITAL_COMMUNITY): Payer: Self-pay | Admitting: Internal Medicine

## 2015-01-01 ENCOUNTER — Encounter (HOSPITAL_COMMUNITY)
Admission: EM | Disposition: A | Discharge status: Skilled Nursing Facility | Payer: Self-pay | Source: Home / Self Care | Attending: Neurology

## 2015-01-01 ENCOUNTER — Inpatient Hospital Stay (HOSPITAL_COMMUNITY): Payer: Medicare PPO

## 2015-01-01 ENCOUNTER — Ambulatory Visit (HOSPITAL_COMMUNITY): Admission: RE | Admit: 2015-01-01 | Payer: Medicare PPO | Source: Ambulatory Visit | Admitting: Internal Medicine

## 2015-01-01 DIAGNOSIS — I340 Nonrheumatic mitral (valve) insufficiency: Secondary | ICD-10-CM

## 2015-01-01 DIAGNOSIS — I639 Cerebral infarction, unspecified: Secondary | ICD-10-CM

## 2015-01-01 HISTORY — PX: EP IMPLANTABLE DEVICE: SHX172B

## 2015-01-01 HISTORY — PX: TEE WITHOUT CARDIOVERSION: SHX5443

## 2015-01-01 SURGERY — ECHOCARDIOGRAM, TRANSESOPHAGEAL
Anesthesia: Moderate Sedation

## 2015-01-01 SURGERY — LOOP RECORDER INSERTION

## 2015-01-01 MED ORDER — MIDAZOLAM HCL 10 MG/2ML IJ SOLN
INTRAMUSCULAR | Status: DC | PRN
  Administered 2015-01-01 (×2): 1 mg via INTRAVENOUS
  Administered 2015-01-01: 2 mg via INTRAVENOUS

## 2015-01-01 MED ORDER — FENTANYL CITRATE (PF) 100 MCG/2ML IJ SOLN
INTRAMUSCULAR | Status: DC | PRN
  Administered 2015-01-01: 25 ug via INTRAVENOUS

## 2015-01-01 MED ORDER — BUTAMBEN-TETRACAINE-BENZOCAINE 2-2-14 % EX AERO
INHALATION_SPRAY | CUTANEOUS | Status: DC | PRN
  Administered 2015-01-01: 2 via TOPICAL

## 2015-01-01 MED ORDER — FENTANYL CITRATE (PF) 100 MCG/2ML IJ SOLN
INTRAMUSCULAR | Status: AC
Start: 2015-01-01 — End: 2015-01-01
  Filled 2015-01-01: qty 2

## 2015-01-01 MED ORDER — LIDOCAINE-EPINEPHRINE 1 %-1:100000 IJ SOLN
INTRAMUSCULAR | Status: DC | PRN
  Administered 2015-01-01: 30 mL

## 2015-01-01 MED ORDER — LIDOCAINE VISCOUS 2 % MT SOLN
OROMUCOSAL | Status: AC
  Filled 2015-01-01: qty 15

## 2015-01-01 MED ORDER — DIPHENHYDRAMINE HCL 50 MG/ML IJ SOLN
INTRAMUSCULAR | Status: AC
  Filled 2015-01-01: qty 1

## 2015-01-01 MED ORDER — LIDOCAINE-EPINEPHRINE 1 %-1:100000 IJ SOLN
INTRAMUSCULAR | Status: AC
  Filled 2015-01-01: qty 1

## 2015-01-01 MED ORDER — MIDAZOLAM HCL 5 MG/ML IJ SOLN
INTRAMUSCULAR | Status: AC
  Filled 2015-01-01: qty 2

## 2015-01-01 MED ORDER — SODIUM CHLORIDE 0.9 % IV SOLN: INTRAVENOUS | Status: DC

## 2015-01-01 SURGICAL SUPPLY — 2 items
LOOP REVEAL LINQSYS (Prosthesis & Implant Heart) ×2 IMPLANT
PACK LOOP INSERTION (CUSTOM PROCEDURE TRAY) ×3 IMPLANT

## 2015-01-01 NOTE — Progress Notes (Signed)
PT Cancellation Note  Patient Details Name: [name] MRN: [id number] DOB: May 12, 1940   Cancelled Treatment:    Reason Eval/Treat Not Completed: Patient at procedure or test/unavailable. Out of room   Kelyn Koskela 01/01/2015, 9:40 AM  Pager [phone]

## 2015-01-01 NOTE — Progress Notes (Signed)
Rehab admissions - Please see consult done by Dr. Posey Pronto recommending likely need for SNF.  PT/OT recommending SNF due to decreased caregiver support.  Recommend pursuit of SNF at this time.  Call me for questions.  [id number]

## 2015-01-01 NOTE — Consult Note (Signed)
ELECTROPHYSIOLOGY CONSULT NOTE  Patient ID: [name] MRN: [id number], DOB/AGE: October 06, 1940   Admit date: 12/29/2014 Date of Consult: 01/01/2015  Primary Physician: Kathlene November, MD Primary Cardiologist: Dr. 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  He has PMHx of CAD/CABG, PVD with right CEA, hs of ETOH abuse, HTN, tobaacco use/abuse, current smoker.   They first developed symptoms while at home, developing L sided weakness, hemiparesis and slurred speech, apparently noted by a neighbor.  The patient states he woke that morning as usual and feeling well.  Imaging demonstrated right insular cortex infarct, neuro felt to be likely embolic.  he has undergone workup for stroke including echocardiogram and carotid angio.  The patient has been monitored on telemetry which has demonstrated sinus rhythm with no arrhythmias.  Inpatient stroke work-up is to be completed with a TEE.   Echocardiogram this admission demonstrated  Study Conclusions - Left ventricle: The cavity size was normal. Systolic function was normal. The estimated ejection fraction was in the range of 55% to 60%. Wall motion was normal; there were no regional wall motion abnormalities. - Pulmonary arteries: PA peak pressure: 32 mm Hg (S). Impressions: - No cardiac source of emboli was indentified..    Lab work is reviewed.  Prior to admission, the patient denies chest pain, shortness of breath, dizziness, palpitations, or syncope.  He reports slow/minimal improvement since his stroke with plans to SNF at discharge.  EP has been asked to evaluate for placement of an implantable loop recorder to monitor for atrial fibrillation.     Past Medical History  Diagnosis Date  . Carotid arterial disease ([place])     s/p RCEA 11/2008 (Note: hx of negative  cardiac nuc 03/2009)  . Hypertension   . Colonic polyp     cscopes 08/18/05 tics,polyps.Marland Kitchenall hyperplastic  . GERD (gastroesophageal reflux disease)     [facility] Esop. (per bx 08/2005) and HH f/u Dr.Schooler  . Anal warts   . Anal fissure   . Hemorrhoids   . 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 Panic attack   . Osteoarthritis   . Depression   . H/O ETOH abuse     Former. Quit ~2012  . BPH (benign prostatic hyperplasia)     s/p surgery  . Anxiety and depression 02/14/2009    Qualifier: Diagnosis of  By: Larose Kells MD, Alda Berthold      Surgical History:  Past Surgical History  Procedure Laterality Date  . Right carotid endartectomy with dacron patch angioplasty  11/2008  . Appendectomy    . Knee surgery    . Foot surgery      fx  . Total hip arthroplasty  2005    R due to OA  . Skin tags excised    . Transurethral resection of prostate    . Cataract extraction      wears contact lens L, good vision  . Coronary artery bypass graft  03/23/2011    Procedure: CORONARY ARTERY BYPASS GRAFTING (CABG);  Surgeon: Melrose Nakayama, MD;  Location: [facility];  Service: Open Heart Surgery;  Laterality: N/A;  CABG x four using bilateral greater saphenous vein, harvested endoscopically  . Left heart catheterization with coronary angiogram N/A 03/19/2011    Procedure: LEFT HEART CATHETERIZATION WITH CORONARY ANGIOGRAM;  Surgeon: Wonda Cheng Nahser,  MD;  Location: [facility];  Service: Cardiovascular;  Laterality: N/A;     Prescriptions prior to admission  Medication Sig Dispense Refill Last Dose  . amLODipine (NORVASC) 10 MG tablet TAKE 1 TABLET BY MOUTH DAILY 90 tablet 2 12/28/2014 at Unknown time  . aspirin 81 MG tablet Take 81 mg by mouth daily.   12/28/2014 at Unknown time  . Cholecalciferol (VITAMIN D3) 1000 UNITS CAPS Take 1 capsule by mouth daily. Takes 1 2000 unit capsule daily.   12/28/2014 at Unknown time  . LORazepam (ATIVAN) 0.5 MG tablet Take 1 tablet (0.5 mg  total) by mouth 2 (two) times daily as needed for anxiety. 60 tablet 0 Past Week at Unknown time  . metoprolol (LOPRESSOR) 50 MG tablet Take 1 tablet (50 mg total) by mouth 2 (two) times daily. 60 tablet 11 12/28/2014 at 2100  . omeprazole (PRILOSEC) 40 MG capsule Take 1 capsule (40 mg total) by mouth daily. 90 capsule 3 12/28/2014 at Unknown time  . pravastatin (PRAVACHOL) 40 MG tablet Take 1 tablet (40 mg total) by mouth daily. 90 tablet 1 12/28/2014 at Unknown time  . vitamin E 400 UNIT capsule Take 400 Units by mouth daily.   12/28/2014 at Unknown time  . nitroGLYCERIN (NITROSTAT) 0.4 MG SL tablet Place 1 tablet (0.4 mg total) under the tongue every 5 (five) minutes as needed for chest pain. 90 tablet 3 unknown at unknown    Inpatient Medications:  . amLODipine  10 mg Oral Daily  . aspirin  325 mg Oral Daily  . cholecalciferol  1,000 Units Oral Daily  . metoprolol  50 mg Oral BID  . pantoprazole (PROTONIX) IV  40 mg Intravenous QHS  . pravastatin  40 mg Oral Daily  . vitamin E  400 Units Oral Daily    Allergies:  Allergies  Allergen Reactions  . Sertraline Hcl     REACTION: heart palpitation     Social History   Social History  . Marital Status: Married    Spouse Name: N/A  . Number of Children: 2  . Years of Education: N/A   Occupational History  . retired     Social History Main Topics  . Smoking status: Current Some Day Smoker -- 1.00 packs/day for 52 years    Types: Cigarettes  . Smokeless tobacco: Never Used     Comment: < 1 ppd   . Alcohol Use: No     Comment: currently not drinking, former abuse   . Drug Use: No  . Sexual Activity: No   Other Topics Concern  . Not on file   Social History Narrative   Lost mom 12-2013   lives by himself, 2 children ([place], [place]), has a girlfriend [name], comes with him at every visit, she visits the pt  daily           Family History  Problem Relation Age of Onset  . Lung cancer Father     mets to brain- deceased    . Arthritis Mother   . Hypertension Mother   . Coronary artery disease Neg Hx   . Colon cancer Neg Hx   . Prostate cancer Neg Hx   . Diabetes Paternal Aunt   . Stroke Paternal Grandfather       Review of Systems: All other systems reviewed and are otherwise negative except as noted above.  Physical Exam: Filed Vitals:   12/31/14 1741 12/31/14 2141 01/01/15 0116 01/01/15 0531  BP: 146/68 138/67 150/75 141/78  Pulse: 71 69 67 67  Temp: 98.7 F (37.1 C) 98.6 F (37 C) 99 F (37.2 C) 98.3 F (36.8 C)  TempSrc: Oral Oral Oral Oral  Resp: 18 18 16 18   Height:      Weight:      SpO2: 99% 98% 96% 97%    GEN- The patient is well appearing, alert and oriented x 3 today.   Head- normocephalic, atraumatic Eyes-  Sclera clear, conjunctiva Nicolas Ears- hearing intact Oropharynx- clear Neck- supple Lungs- Clear to ausculation bilaterally, normal work of breathing Heart- Regular rate and rhythm, no murmurs, rubs or gallops  GI- soft, NT, ND Extremities- no clubbing, cyanosis, or edema MS- no significant deformity or atrophy Skin- no rash or lesion Psych- euthymic mood, full affect   Labs:   Lab Results  Component Value Date   WBC 10.1 12/29/2014   HGB 16.0 12/29/2014   HCT 47.0 12/29/2014   MCV 95.6 12/29/2014   PLT 163 12/29/2014    Recent Labs Lab 12/29/14 1238 12/29/14 1307  NA 142 143  K 4.1 4.2  CL 110 109  CO2 25  --   BUN 14 17  CREATININE 1.26* 1.10  CALCIUM 9.1  --   PROT 6.8  --   BILITOT 0.5  --   ALKPHOS 89  --   ALT 16*  --   AST 21  --   GLUCOSE 95 86   Lab Results  Component Value Date   CKTOTAL 46 03/19/2011   CKMB 1.5 03/19/2011   TROPONINI <0.30 03/19/2011   Lab Results  Component Value Date   CHOL 112 12/30/2014   CHOL 100 09/18/2014   CHOL 120 02/20/2013   Lab Results  Component Value Date   HDL 30* 12/30/2014   HDL 37.10* 09/18/2014   HDL 37.10* 02/20/2013   Lab Results  Component Value Date   LDLCALC 59 12/30/2014    LDLCALC 46 09/18/2014   LDLCALC 66 02/20/2013   Lab Results  Component Value Date   TRIG 114 12/30/2014   TRIG 84.0 09/18/2014   TRIG 87.0 02/20/2013   Lab Results  Component Value Date   CHOLHDL 3.7 12/30/2014   CHOLHDL 3 09/18/2014   CHOLHDL 3 02/20/2013   No results found for: LDLDIRECT  No results found for: DDIMER   Radiology/Studies:  Ct Angio Head W/cm &/or Wo Cm 12/29/2014  CLINICAL DATA:  Sudden onset of expressive aphasia. Abnormal CT of the head. Left-sided weakness and gaze preference to the right. Left hemiparesis has near completely resolved. EXAM: CT ANGIOGRAPHY HEAD AND NECK TECHNIQUE: Multidetector CT imaging of the head and neck was performed using the standard protocol during bolus administration of intravenous contrast. Multiplanar CT image reconstructions and MIPs were obtained to evaluate the vascular anatomy. Carotid stenosis measurements (when applicable) are obtained utilizing NASCET criteria, using the distal internal carotid diameter as the denominator. CONTRAST:  95mL OMNIPAQUE IOHEXOL 350 MG/ML SOLN COMPARISON:  CT head without contrast from the same day. MRI brain 11/19/2008 FINDINGS: CT HEAD Brain: Age indeterminate infarct is again noted in the anterior left frontal lobe and operculum. Moderate atrophy and diffuse white matter disease is present. The ventricles are proportionate to the degree of atrophy. No significant extra-axial fluid collection present. Calvarium and skull base: The calvarium is intact. The skullbase is unremarkable. Paranasal sinuses: Mucosal thickening polypoid changes are noted in the posterior maxillary sinuses bilaterally. Paranasal sinuses are otherwise clear. There is some fluid in the mastoid air cells bilaterally. No  obstructing nasopharyngeal lesion is evident. Orbits: Bilateral lens replacements are present. The globes and orbits are otherwise intact. CTA NECK Aortic arch: A 3 vessel arch configuration is present. Atherosclerotic  changes are noted at the origin of the left subclavian artery without a significant stenosis relative to the more distal vessel. Right carotid system: The right common carotid artery is tortuous without a significant focal stenosis. Atherosclerotic irregularity is present with an the endarterectomy. There is no focal stenosis relative to the more distal vessel. The cervical right ICA is otherwise normal. Left carotid system: The left common carotid artery demonstrates atherosclerotic calcifications medially without a significant stenosis relative to the more distal vessel. The left carotid bifurcation demonstrates more dense calcifications without a significant stenosis relative to the more distal vessel. The cervical left ICA is otherwise normal. Vertebral arteries:The vertebral arteries both originate from the subclavian arteries. Proximal calcifications are present within the right subclavian artery without a significant stenosis. The vertebral arteries are codominant. There is no significant stenosis within either vertebral artery in the neck. Skeleton: Multilevel endplate degenerative changes are present in the cervical spine. Degenerative anterolisthesis as present C3-4 and C4-5. Vertebral body heights and alignment are otherwise normal. Osseous foraminal narrowing is present at multiple levels, most severe on the right at C5-6. Median sternotomy is noted. There is mild rightward curvature of the cervical spine. Other neck: No focal mucosal or submucosal lesions are present. Thyroid is within limits. There is no significant cervical adenopathy. The salivary glands are intact. CTA HEAD Anterior circulation: Atherosclerotic calcifications are present within the precavernous and cavernous internal carotid arteries bilaterally without significant stenosis. The A1 and M1 segments are normal. The anterior communicating artery is patent. The MCA bifurcations are intact. The ACA and MCA branch vessels are within  normal limits. Posterior circulation: The vertebral arteries are codominant. The PICA origins are visualized and normal. The basilar artery is within normal limits. Both posterior cerebral arteries originate from the basilar tip. PCA branch vessels are intact. Venous sinuses: The dural sinuses are patent. The right transverse sinus is dominant. The straight sinus and deep cerebral veins are intact. The cortical veins are unremarkable. Anatomic variants: None Delayed phase: The postcontrast images demonstrate no pathologic enhancement. IMPRESSION: 1. Age indeterminate infarct involving the anterior left insular cortex and frontal operculum. 2. Moderate atrophy and white matter disease. 3. No other acute cortical infarct. 4. Atherosclerotic calcifications are present in the left common carotid artery the left carotid bifurcation bilateral cavernous internal carotid arteries without significant stenosis. 5. Atherosclerotic calcifications at the origin of the left subclavian artery and right subclavian artery. There is no associated stenosis. 6. Multilevel spondylosis of the cervical spine with foraminal narrowing at multiple levels. Electronically Signed   By: San Morelle M.D.   On: 12/29/2014 14:45   Ct Head Wo Contrast 12/29/2014  CLINICAL DATA:  Code stroke, sudden onset of slurred speech and LEFT-sided weakness, RIGHT gaze preference, history carotid artery disease, hypertension, smoker EXAM: CT HEAD WITHOUT CONTRAST TECHNIQUE: Contiguous axial images were obtained from the base of the skull through the vertex without intravenous contrast. COMPARISON:  11/16/2008; correlation MR/MRA brain 11/19/2008 FINDINGS: Generalized atrophy. Diffuse dilatation of the ventricular system not significantly changed. No midline shift or mass effect. Small vessel chronic ischemic changes of deep cerebral white matter. Question tiny old deep infarcts in the cerebellar hemispheres bilaterally. Area of low attenuation  identified at the lateral LEFT frontal region compatible with age-indeterminate infarction. No intracranial hemorrhage, mass lesion, or evidence  of additional acute infarction. No extra-axial fluid collections. Atherosclerotic calcifications bilaterally at the carotid siphons. Bones and sinuses unremarkable. IMPRESSION: Atrophy with small vessel chronic ischemic changes. Age-indeterminate infarct LEFT frontal region. Questionable tiny lacunar infarcts at the cerebellar hemispheres. No intracranial hemorrhage. Findings called to Dr. Armida Sans on 12/29/2014 at 1309 hour. Electronically Signed   By: Lavonia Dana M.D.   On: 12/29/2014 13:09    Mr Jodene Nam Head Wo Contrast 12/30/2014  CLINICAL DATA:  Left hemi paresis. Right gaze preference. Dysarthria. EXAM: MRI HEAD WITHOUT CONTRAST MRA HEAD WITHOUT CONTRAST TECHNIQUE: Multiplanar, multiecho pulse sequences of the brain and surrounding structures were obtained without intravenous contrast. Angiographic images of the head were obtained using MRA technique without contrast. COMPARISON:  CTA of the head and neck 12/29/2014. CT head without contrast 12/29/2014 FINDINGS: MRI HEAD FINDINGS Diffusion-weighted images demonstrate acute nonhemorrhagic infarct along the right insular cortex. There are 3 additional punctate foci of acute nonhemorrhagic infarction involving the posterior right frontal lobe. Two of these lesions involves the precentral gyrus. Focal diffusion abnormality is also present along the anterior left insular cortex and frontal lobe. The ADC map is more isointense with this lesion. T2 changes are more prominent in the left anterior insular lesion. The study is mildly degraded by patient motion. Moderate atrophy and mild periventricular white matter changes are present bilaterally. The ventricles are proportionate to the degree of atrophy. No significant extra-axial fluid collection is present. A remote lacunar infarct is present in the inferior right cerebellum.  Flow is present in the major intracranial arteries. Bilateral lens replacements are present. The globes and orbits are intact. The paranasal sinuses and mastoid air cells are clear. Midline sagittal images are unremarkable. MRA HEAD FINDINGS The study is moderately degraded by patient motion. The internal carotid arteries are within normal limits from the high cervical segments through the ICA termini bilaterally. The A1 and M1 segments are normal. There is a high-grade stenosis within the superior right M2 division just beyond the bifurcation. There is moderate attenuation of distal small vessels bilaterally, left greater than right. The vertebral arteries are codominant. The left PICA origin is visualized and normal. The basilar artery is normal. Both posterior cerebral arteries originate from the basilar tip. IMPRESSION: 1. Acute nonhemorrhagic infarct involving the right insular cortex. 2. Additional punctate areas of acute nonhemorrhagic infarct are present more superiorly in the right frontal lobe with at least 2 lesions along the precentral gyrus. 3. Slightly older acute/subacute nonhemorrhagic infarct involving the anterior left insular cortex and frontal lobe. 4. High-grade stenosis in the superior right M2 division. 5. No significant proximal stenosis, aneurysm, or branch vessel occlusion within the circle of Willis otherwise. 6. Moderate MCA small vessel disease bilaterally. 7. Moderate generalized atrophy at and periventricular white matter disease bilaterally consistent with chronic small vessel ischemia. Electronically Signed   By: San Morelle M.D.   On: 12/30/2014 14:53   Mr Brain Wo Contrast 12/30/2014  CLINICAL DATA:  Left hemi paresis. Right gaze preference. Dysarthria. EXAM: MRI HEAD WITHOUT CONTRAST MRA HEAD WITHOUT CONTRAST TECHNIQUE: Multiplanar, multiecho pulse sequences of the brain and surrounding structures were obtained without intravenous contrast. Angiographic images of the  head were obtained using MRA technique without contrast. COMPARISON:  CTA of the head and neck 12/29/2014. CT head without contrast 12/29/2014 FINDINGS: MRI HEAD FINDINGS Diffusion-weighted images demonstrate acute nonhemorrhagic infarct along the right insular cortex. There are 3 additional punctate foci of acute nonhemorrhagic infarction involving the posterior right frontal lobe. Two of  these lesions involves the precentral gyrus. Focal diffusion abnormality is also present along the anterior left insular cortex and frontal lobe. The ADC map is more isointense with this lesion. T2 changes are more prominent in the left anterior insular lesion. The study is mildly degraded by patient motion. Moderate atrophy and mild periventricular white matter changes are present bilaterally. The ventricles are proportionate to the degree of atrophy. No significant extra-axial fluid collection is present. A remote lacunar infarct is present in the inferior right cerebellum. Flow is present in the major intracranial arteries. Bilateral lens replacements are present. The globes and orbits are intact. The paranasal sinuses and mastoid air cells are clear. Midline sagittal images are unremarkable. MRA HEAD FINDINGS The study is moderately degraded by patient motion. The internal carotid arteries are within normal limits from the high cervical segments through the ICA termini bilaterally. The A1 and M1 segments are normal. There is a high-grade stenosis within the superior right M2 division just beyond the bifurcation. There is moderate attenuation of distal small vessels bilaterally, left greater than right. The vertebral arteries are codominant. The left PICA origin is visualized and normal. The basilar artery is normal. Both posterior cerebral arteries originate from the basilar tip. IMPRESSION: 1. Acute nonhemorrhagic infarct involving the right insular cortex. 2. Additional punctate areas of acute nonhemorrhagic infarct are  present more superiorly in the right frontal lobe with at least 2 lesions along the precentral gyrus. 3. Slightly older acute/subacute nonhemorrhagic infarct involving the anterior left insular cortex and frontal lobe. 4. High-grade stenosis in the superior right M2 division. 5. No significant proximal stenosis, aneurysm, or branch vessel occlusion within the circle of Willis otherwise. 6. Moderate MCA small vessel disease bilaterally. 7. Moderate generalized atrophy at and periventricular white matter disease bilaterally consistent with chronic small vessel ischemia. Electronically Signed   By: San Morelle M.D.   On: 12/30/2014 14:53    12-lead ECG is SR All prior EKG's in EPIC reviewed with no documented atrial fibrillation  Telemetry SR, rare VPC, APC, very brief atrial tach episode  Assessment and Plan:  1. Cryptogenic stroke The patient presents with cryptogenic stroke.  The patient has a TEE planned for this AM.  I spoke at length with the patient about monitoring for afib with either a 30 day event monitor or an implantable loop recorder.  Risks, benefits, and alteratives to implantable loop recorder were discussed with the patient today.   At this time, the patient is very clear in their decision to proceed with implantable loop recorder.   Wound care was reviewed with the patient (keep incision clean and dry for 3 days).  Wound check scheduled for the patient.  Please call with questions.   Renee Dyane Dustman, PA-C 01/01/2015   I have seen, examined the patient, and reviewed the above assessment and plan. On exam, comfortable appearing. Changes to above are made where necessary.   Will proceed with implantable loop recorder if TEE is unrevealing.  Co Sign: Fickett Grayer, MD 01/01/2015 8:05 AM

## 2015-01-01 NOTE — H&P (Signed)
    INTERVAL PROCEDURE H&P  History and Physical Interval Note:  01/01/2015 10:01 AM  [name] has presented today for their planned procedure. The various methods of treatment have been discussed with the patient and family. After consideration of risks, benefits and other options for treatment, the patient has consented to the procedure.  The patients' outpatient history has been reviewed, patient examined, and no change in status from most recent office note within the past 30 days. I have reviewed the patients' chart and labs and will proceed as planned. Questions were answered to the patient's satisfaction.   Pixie Casino, MD, [facility] Attending Cardiologist [place] 01/01/2015, 10:01 AM

## 2015-01-01 NOTE — Progress Notes (Signed)
Echocardiogram 2D Echocardiogram has been performed.  [name] 01/01/2015, 10:55 AM

## 2015-01-01 NOTE — CV Procedure (Signed)
TRANSESOPHAGEAL ECHOCARDIOGRAM (TEE) NOTE  INDICATIONS: stroke  PROCEDURE:   Informed consent was obtained prior to the procedure. The risks, benefits and alternatives for the procedure were discussed and the patient comprehended these risks.  Risks include, but are not limited to, cough, sore throat, vomiting, nausea, somnolence, esophageal and stomach trauma or perforation, bleeding, low blood pressure, aspiration, pneumonia, infection, trauma to the teeth and death.    After a procedural time-out, the patient was given 3 mg versed and 25 mcg fentanyl for moderate sedation.  The oropharynx was anesthetized 10 cc of topical 1% viscous lidocaine and 2 cetacaine spray.  The transesophageal probe was inserted in the esophagus and stomach without difficulty and multiple views were obtained.  The patient was kept under observation until the patient left the procedure room.  The patient left the procedure room in stable condition.   Agitated microbubble saline contrast was not administered.  COMPLICATIONS:    There were no immediate complications.  Findings:  1. LEFT VENTRICLE: The left ventricular wall thickness is mildly increased.  The left ventricular cavity is normal in size. Wall motion is normal.  LVEF is 55-60%.  2. RIGHT VENTRICLE:  The right ventricle is normal in structure and function without any thrombus or masses.    3. LEFT ATRIUM:  The left atrium is dilated in size without any thrombus or masses.  There is not spontaneous echo contrast ("smoke") in the left atrium consistent with a low flow state.  4. LEFT ATRIAL APPENDAGE:  The left atrial appendage is small in size (windsock shape) and is free of any thrombus or masses. The appendage has single lobes. Pulse doppler indicates moderate flow in the appendage.  5. ATRIAL SEPTUM:  The atrial septum appears intact and is free of thrombus and/or masses.  There is no evidence for interatrial shunting by color doppler and saline  microbubble.  6. RIGHT ATRIUM:  The right atrium is normal in size and function without any thrombus or masses.  7. MITRAL VALVE:  The mitral valve is normal in structure and function with Mild regurgitation.  There were no vegetations or stenosis.  8. AORTIC VALVE:  The aortic valve is trileaflet. There is moderate calcification of the non-coronary cusp. No regurgitation.  There was no stenosis - AVA by planimetry was 2.35 cm2.  9. TRICUSPID VALVE:  The tricuspid valve is normal in structure and function with Mild regurgitation.  There were no vegetations or stenosis  10.  PULMONIC VALVE:  The pulmonic valve is normal in structure and function with trivial regurgitation.  There were no vegetations or stenosis.   11. AORTIC ARCH, ASCENDING AND DESCENDING AORTA:  There was grade 3 Ron Parker et. Al, 1992) atherosclerosis of the aortic arch and proximal descending aorta.  12. PULMONARY VEINS: Anomalous pulmonary venous return was not noted.  13. PERICARDIUM: The pericardium appeared normal and non-thickened.  There is no pericardial effusion.  IMPRESSION:   1. No LAA thrombus 2. Negative for PFO 3. Mild MR and TR 4. Calcified non-coronary cusp of the aortic valve with decreased mobility, but no significant stenosis. 5. LVEF 55-60% 6. Moderate Grade 3 plaque of the aortic arch and proximal descending aorta.  RECOMMENDATIONS:    1.  No obvious cardiac source of emboli, however, the non-coronary cusp of the aortic valve is calcified and there is significant calcified plaque in the aorta. Would be reasonable to proceed with implanted loop recorder.  Time Spent Directly with the Patient:  45 minutes  Pixie Casino, MD, [facility] Attending Cardiologist [facility]  01/01/2015, 10:40 AM

## 2015-01-01 NOTE — Clinical Social Work Note (Signed)
Clinical Social Work Assessment  Patient Details  Name: [name] MRN: [id number] Date of Birth: 1940/07/22  Date of referral:  01/01/15               Reason for consult:  Facility Placement, Discharge Planning                Permission sought to share information with:  Family Supports, Customer service manager, Case Optician, dispensing granted to share information::  Yes, Verbal Permission Granted  Name::      Karleen Dolphin )  Agency::   (SNF's )  Relationship::   (Friend/Care Giver  )  Sport and exercise psychologist Information:   [phone])  Housing/Transportation Living arrangements for the past 2 months:  Single Family Home Source of Information:  Friend/Neighbor Patient Interpreter Needed:  None Criminal Activity/Legal Involvement Pertinent to Current Situation/Hospitalization:  No - Comment as needed Significant Relationships:  Friend Lives with:  Self Do you feel safe going back to the place where you live?  No Need for family participation in patient care:  No (Coment)  Care giving concerns:  Patient requiring short-term SNF placement.    Social Worker assessment / plan:  Holiday representative spoke with patient's friend in reference to [place] [facility] for SNF. CSW introduced CSW role and SNF process. Pt's friend reported that pt has been to [facility] before in the past however does not wish for pt to return. Pt is currently away at procedure. Pt's friend agreeable to SNF and planning to arrive at [facility] shortly and will further discuss SNF options with pt. No further concerns reported at this time. CSW will continue to follow pt and pt's family to provide continued support and to facilitate pt's discharge needs once medically stable.   Employment status:  Retired Nurse, adult PT Recommendations:  [place] / Referral to community resources:  [place]  Patient/Family's Response to  care:  Pt disoriented. Pt's friend agreeable to SNF placement however does not wish for pt to return to [facility]. Pt's friend pleasant and appreciated social work intervention.   Patient/Family's Understanding of and Emotional Response to Diagnosis, Current Treatment, and Prognosis: Pt's friend supportive and involved in pt's care. Pt's friend knowledgeable of medical work up.   Emotional Assessment Appearance:  Appears stated age Attitude/Demeanor/Rapport:  Unable to Assess Affect (typically observed):  Unable to Assess Orientation:  Oriented to Self Alcohol / Substance use:  Not Applicable Psych involvement (Current and /or in the community):  No (Comment)  Discharge Needs  Concerns to be addressed:  Care Coordination Readmission within the last 30 days:  No Current discharge risk:  Dependent with Mobility Barriers to Discharge:  Continued Medical Work up   [employer], MSW, [place] [phone] 01/01/2015 11:20 AM

## 2015-01-01 NOTE — Clinical Social Work Note (Signed)
Patient and a friend chooses bed at [facility]. Facility notified and will need to begin [facility] authorization.   CSW remains available as needed.  Glendon Axe, MSW, LCSWA [phone] 01/01/2015 4:00 PM

## 2015-01-01 NOTE — Evaluation (Signed)
Speech Language Pathology Evaluation Patient Details Name: KYMEL JACKA MRN: [id number] DOB: [birth year] Today's Date: 01/01/2015 Time: [id number] SLP Time Calculation (min) (ACUTE ONLY): 30 min  Problem List:  Patient Active Problem List   Diagnosis Date Noted  . 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 Coronary artery disease involving native coronary artery of native heart without angina pectoris   . History of ETOH abuse   . Tachypnea   . Adjustment disorder with mixed anxiety and depressed mood   . BPH (benign prostatic hyperplasia)   . Hemiparesis affecting dominant side as late effect of stroke ([place])   . Hemiparesis affecting left side as late effect of stroke ([place])   . Hemiparesis and alteration of sensations as late effects of stroke ([place])   . Stroke with cerebral ischemia ([place]) 12/29/2014  . Follow-up ----------- PCP NOTES 09/18/2014  . Hiccups 02/20/2013  . Hyperglycemia 08/19/2012  . BCC (basal cell carcinoma of skin) 10/16/2011  . CAD (coronary artery disease) 05/13/2011  . Hyperlipidemia 05/26/2010  . Annual physical exam 05/26/2010  . GAIT DISTURBANCE 11/25/2009  . Anxiety and depression 02/14/2009  . FATIGUE 02/14/2009  . ALCOHOL ABUSE 12/03/2008  . CAROTID ARTERY DISEASE 12/03/2008  . Osteoarthritis 02/21/2008  . ERECTILE DYSFUNCTION 05/18/2007  . TOBACCO ABUSE 05/18/2007  . BARRETTS ESOPHAGUS 02/21/2007  . BENIGN PROSTATIC HYPERTROPHY, HX OF 02/21/2007  . Essential hypertension 04/19/2006  . GERD 04/19/2006  . COLONIC POLYPS, BENIGN, HX OF 04/19/2006   Past Medical History:  Past Medical History  Diagnosis Date  . Carotid arterial disease ([place])     s/p RCEA 11/2008 (Note: hx of negative cardiac nuc 03/2009)  . Hypertension   . Colonic polyp     cscopes 08/18/05 tics,polyps.Marland Kitchenall hyperplastic  . GERD (gastroesophageal reflux disease)     Barrets Esop. (per bx 08/2005) and HH f/u Dr.Schooler  . Anal warts   . Anal fissure    . Hemorrhoids   . 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 Panic attack   . Osteoarthritis   . Depression   . H/O ETOH abuse     Former. Quit ~2012  . BPH (benign prostatic hyperplasia)     s/p surgery  . Anxiety and depression 02/14/2009    Qualifier: Diagnosis of  By: Larose Kells MD, [place]    Past Surgical History:  Past Surgical History  Procedure Laterality Date  . Right carotid endartectomy with dacron patch angioplasty  11/2008  . Appendectomy    . Knee surgery    . Foot surgery      fx  . Total hip arthroplasty  2005    R due to OA  . Skin tags excised    . Transurethral resection of prostate    . Cataract extraction      wears contact lens L, good vision  . Coronary artery bypass graft  03/23/2011    Procedure: CORONARY ARTERY BYPASS GRAFTING (CABG);  Surgeon: Melrose Nakayama, MD;  Location: [facility];  Service: Open Heart Surgery;  Laterality: N/A;  CABG x four using bilateral greater saphenous vein, harvested endoscopically  . Left heart catheterization with coronary angiogram N/A 03/19/2011    Procedure: LEFT HEART CATHETERIZATION WITH CORONARY ANGIOGRAM;  Surgeon: Thayer Headings, MD;  Location: [facility] CATH LAB;  Service: Cardiovascular;  Laterality: N/A;   HPI:  74 yo male adm to [facility] with left sided weakness, dysarthria and  right gaze preference.  Pt found to have a right insular CVA.  PMH + for ETOH, HTN, tobacco use, Barrett's esophagus.  Pt reports he lives aone and is left handed.  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    Assessment / Plan / Recommendation Clinical Impression  Pt presents wtih cognitive linguistic deficits with delayed processing, disorientation and minimal dysarthria.   Delayed processing of information noted with pt benefiting from multple repetitions of directions during evaluation.  Breakdown most notable at 3 step directions and is largely impacted by pt's attention.   Pt named 6 animals in 60 seconds with cue at approx 30 seconds  - admitting this is impaired compared to baseline.    Areas of strength include language comprehension and expressive language ability.  Pt was oriented to self, date but required cues for symptoms and diagnosis.  As pt reports independence prior to admit, skilled SLP indicated to maximize rehabiliation abilties and decrease caregiver burden.  Educated pt to findings and recommendations - including to decrease distractions with visitors/healthcare workers to allow improved sustained attention.  Pt agreeable to plan.      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  Overall Cognitive Status: Impaired/Different from baseline Orientation Level: Disoriented to situation;Oriented to person;Oriented to place Attention: Sustained Sustained Attention: Impaired Sustained Attention Impairment: Verbal basic Memory: Impaired Memory Impairment: Decreased recall of new information Awareness: Impaired Awareness Impairment: Intellectual impairment (pt with improved awareness to attention, processing changes after demonstration)    Comprehension  Auditory Comprehension Overall Auditory Comprehension: Impaired Yes/No Questions: Not tested Commands: Impaired One Step Basic Commands: 75-100% accurate Two Step Basic Commands: 75-100% accurate Conversation: Complex Other Conversation Comments: 1/3 stes followed Interfering Components: Attention;Processing speed Visual Recognition/Discrimination Discrimination: Not tested Reading Comprehension Reading Status:  (pt read calendar/clock on wall)    Expression Expression Primary Mode of Expression: Verbal Verbal  Expression Overall Verbal Expression: Appears within functional limits for tasks assessed Initiation: No impairment Level of Generative/Spontaneous Verbalization: Sentence Repetition: No impairment Naming: Not tested Pragmatics:  (flat affect but uncertain to baseline) Interfering Components: Attention Written Expression Dominant Hand: Left Written Expression: Not tested   Oral / Motor Oral Motor/Sensory Function Overall Oral Motor/Sensory Function: Generalized oral weakness Motor Speech Overall Motor Speech: Impaired Respiration: Within functional limits Phonation: Low vocal intensity Resonance: Within functional limits Articulation: Within functional limitis Intelligibility: Intelligible Motor Planning: Witnin functional limits    Claudie Fisherman, [place] [facility] SLP [phone]

## 2015-01-01 NOTE — Progress Notes (Signed)
Pt returned from TEE and loop recorder procedure alert, verbal with no noted distress. Pt denies pain or discomfort. Dressing to left chest clean, dry and intact. Will continue to monitor. Wife at bedside.

## 2015-01-01 NOTE — Progress Notes (Signed)
Stroke Team Progress Note  HISTORY  74 Y/O with a past medical history significant for HTN, CAD s/p CABG, carotid disease s/p right CEA, former alcohol abuse, brought to the ED via EMS as a code stroke due to acute onset of L hemiparesis, dysarthria, R gaze preference.   Patient friend is at the bedside and stated that Mr Alpers was normal when she talked to him on the phone around 10:30 today, but approximately 1 hour later he called her and she got concerned because he was slurring his words. EMS was called by the patient and they found him with with flaccid weakness left side, slurred speech, and gaze preference to the right. Upon arrival to the ED he was alert and awake with dense left hemiparesis, right gaze preference, and dysarthria. His left hemiparesis virtually resolved after returning from the CT suite but still with a high NIHSS 11.   SUBJECTIVE Neurologically stable. BP controlled.. Patient just returned from TEE and loop recorder insertion.  OBJECTIVE Most recent Vital Signs: Filed Vitals:   01/01/15 1150 01/01/15 1200 01/01/15 1210 01/01/15 1317  BP:    103/58  Pulse: 63 59 58 73  Temp:    98.2 F (36.8 C)  TempSrc:    Oral  Resp: 15 17 15 16   Height:      Weight:      SpO2: 96% 95% 96% 98%   CBG (last 3)  No results for input(s): GLUCAP in the last 72 hours.  IV Fluid Intake:      MEDICATIONS  . amLODipine  10 mg Oral Daily  . aspirin  325 mg Oral Daily  . cholecalciferol  1,000 Units Oral Daily  . metoprolol  50 mg Oral BID  . pantoprazole (PROTONIX) IV  40 mg Intravenous QHS  . pravastatin  40 mg Oral Daily  . vitamin E  400 Units Oral Daily   PRN:  acetaminophen **OR** [DISCONTINUED] acetaminophen, LORazepam, nitroGLYCERIN, senna-docusate  Diet:     liquids Activity:  up with assistance DVT Prophylaxis:  scds followed by heparin sq  CLINICALLY SIGNIFICANT STUDIES Basic Metabolic Panel:   Recent Labs Lab 12/29/14 1238 12/29/14 1307  NA 142 143   K 4.1 4.2  CL 110 109  CO2 25  --   GLUCOSE 95 86  BUN 14 17  CREATININE 1.26* 1.10  CALCIUM 9.1  --    Liver Function Tests:   Recent Labs Lab 12/29/14 1238  AST 21  ALT 16*  ALKPHOS 89  BILITOT 0.5  PROT 6.8  ALBUMIN 3.5   CBC:   Recent Labs Lab 12/29/14 1238 12/29/14 1307  WBC 10.1  --   NEUTROABS 5.8  --   HGB 14.9 16.0  HCT 45.1 47.0  MCV 95.6  --   PLT 163  --    Coagulation:   Recent Labs Lab 12/29/14 1238  LABPROT 14.7  INR 1.13   Cardiac Enzymes: No results for input(s): CKTOTAL, CKMB, CKMBINDEX, TROPONINI in the last 168 hours. Urinalysis:   Recent Labs Lab 12/29/14 1805  COLORURINE YELLOW  LABSPEC 1.010  PHURINE 6.0  GLUCOSEU NEGATIVE  HGBUR TRACE*  BILIRUBINUR NEGATIVE  KETONESUR 15*  PROTEINUR NEGATIVE  NITRITE NEGATIVE  LEUKOCYTESUR NEGATIVE   Lipid Panel    Component Value Date/Time   CHOL 112 12/30/2014 0320   TRIG 114 12/30/2014 0320   HDL 30* 12/30/2014 0320   CHOLHDL 3.7 12/30/2014 0320   VLDL 23 12/30/2014 0320   LDLCALC 59 12/30/2014 0320  HgbA1C  Lab Results  Component Value Date   HGBA1C 5.5 12/30/2014    Urine Drug Screen:      Component Value Date/Time   LABOPIA NONE DETECTED 12/29/2014 1805   COCAINSCRNUR NONE DETECTED 12/29/2014 1805   LABBENZ NONE DETECTED 12/29/2014 1805   AMPHETMU NONE DETECTED 12/29/2014 1805   THCU NONE DETECTED 12/29/2014 1805   LABBARB NONE DETECTED 12/29/2014 1805    Alcohol Level:   Recent Labs Lab 12/29/14 1238  ETH <5    Mr Jodene Nam Head Wo Contrast  12/30/2014  CLINICAL DATA:  Left hemi paresis. Right gaze preference. Dysarthria. EXAM: MRI HEAD WITHOUT CONTRAST MRA HEAD WITHOUT CONTRAST TECHNIQUE: Multiplanar, multiecho pulse sequences of the brain and surrounding structures were obtained without intravenous contrast. Angiographic images of the head were obtained using MRA technique without contrast. COMPARISON:  CTA of the head and neck 12/29/2014. CT head without  contrast 12/29/2014 FINDINGS: MRI HEAD FINDINGS Diffusion-weighted images demonstrate acute nonhemorrhagic infarct along the right insular cortex. There are 3 additional punctate foci of acute nonhemorrhagic infarction involving the posterior right frontal lobe. Two of these lesions involves the precentral gyrus. Focal diffusion abnormality is also present along the anterior left insular cortex and frontal lobe. The ADC map is more isointense with this lesion. T2 changes are more prominent in the left anterior insular lesion. The study is mildly degraded by patient motion. Moderate atrophy and mild periventricular white matter changes are present bilaterally. The ventricles are proportionate to the degree of atrophy. No significant extra-axial fluid collection is present. A remote lacunar infarct is present in the inferior right cerebellum. Flow is present in the major intracranial arteries. Bilateral lens replacements are present. The globes and orbits are intact. The paranasal sinuses and mastoid air cells are clear. Midline sagittal images are unremarkable. MRA HEAD FINDINGS The study is moderately degraded by patient motion. The internal carotid arteries are within normal limits from the high cervical segments through the ICA termini bilaterally. The A1 and M1 segments are normal. There is a high-grade stenosis within the superior right M2 division just beyond the bifurcation. There is moderate attenuation of distal small vessels bilaterally, left greater than right. The vertebral arteries are codominant. The left PICA origin is visualized and normal. The basilar artery is normal. Both posterior cerebral arteries originate from the basilar tip. IMPRESSION: 1. Acute nonhemorrhagic infarct involving the right insular cortex. 2. Additional punctate areas of acute nonhemorrhagic infarct are present more superiorly in the right frontal lobe with at least 2 lesions along the precentral gyrus. 3. Slightly older  acute/subacute nonhemorrhagic infarct involving the anterior left insular cortex and frontal lobe. 4. High-grade stenosis in the superior right M2 division. 5. No significant proximal stenosis, aneurysm, or branch vessel occlusion within the circle of Willis otherwise. 6. Moderate MCA small vessel disease bilaterally. 7. Moderate generalized atrophy at and periventricular white matter disease bilaterally consistent with chronic small vessel ischemia. Electronically Signed   By: San Morelle M.D.   On: 12/30/2014 14:53   Mr Brain Wo Contrast  12/30/2014  CLINICAL DATA:  Left hemi paresis. Right gaze preference. Dysarthria. EXAM: MRI HEAD WITHOUT CONTRAST MRA HEAD WITHOUT CONTRAST TECHNIQUE: Multiplanar, multiecho pulse sequences of the brain and surrounding structures were obtained without intravenous contrast. Angiographic images of the head were obtained using MRA technique without contrast. COMPARISON:  CTA of the head and neck 12/29/2014. CT head without contrast 12/29/2014 FINDINGS: MRI HEAD FINDINGS Diffusion-weighted images demonstrate acute nonhemorrhagic infarct along the right insular  cortex. There are 3 additional punctate foci of acute nonhemorrhagic infarction involving the posterior right frontal lobe. Two of these lesions involves the precentral gyrus. Focal diffusion abnormality is also present along the anterior left insular cortex and frontal lobe. The ADC map is more isointense with this lesion. T2 changes are more prominent in the left anterior insular lesion. The study is mildly degraded by patient motion. Moderate atrophy and mild periventricular white matter changes are present bilaterally. The ventricles are proportionate to the degree of atrophy. No significant extra-axial fluid collection is present. A remote lacunar infarct is present in the inferior right cerebellum. Flow is present in the major intracranial arteries. Bilateral lens replacements are present. The globes and  orbits are intact. The paranasal sinuses and mastoid air cells are clear. Midline sagittal images are unremarkable. MRA HEAD FINDINGS The study is moderately degraded by patient motion. The internal carotid arteries are within normal limits from the high cervical segments through the ICA termini bilaterally. The A1 and M1 segments are normal. There is a high-grade stenosis within the superior right M2 division just beyond the bifurcation. There is moderate attenuation of distal small vessels bilaterally, left greater than right. The vertebral arteries are codominant. The left PICA origin is visualized and normal. The basilar artery is normal. Both posterior cerebral arteries originate from the basilar tip. IMPRESSION: 1. Acute nonhemorrhagic infarct involving the right insular cortex. 2. Additional punctate areas of acute nonhemorrhagic infarct are present more superiorly in the right frontal lobe with at least 2 lesions along the precentral gyrus. 3. Slightly older acute/subacute nonhemorrhagic infarct involving the anterior left insular cortex and frontal lobe. 4. High-grade stenosis in the superior right M2 division. 5. No significant proximal stenosis, aneurysm, or branch vessel occlusion within the circle of Willis otherwise. 6. Moderate MCA small vessel disease bilaterally. 7. Moderate generalized atrophy at and periventricular white matter disease bilaterally consistent with chronic small vessel ischemia. Electronically Signed   By: San Morelle M.D.   On: 12/30/2014 14:53    CT of the brain  Age indeterminate infarct involving the anterior left insular cortex and frontal operculum. Atherosclerotic calcifications are present in the left common carotid artery the left carotid bifurcation bilateral cavernous internal carotid arteries without significant stenosis.  CT angiogram  Neck: Atherosclerotic calcifications are present in the left common carotid artery the left carotid bifurcation  bilateral cavernous internal carotid arteries without significant stenosis.  Atherosclerotic calcifications at the origin of the left subclavian artery and right subclavian artery. There is no associated stenosis. CT angiogram : Atherosclerotic calcifications are present within the precavernous and cavernous internal carotid arteries bilaterally without significant stenosis. The A1 and M1 segments are normal. The anterior communicating artery is patent. The MCA bifurcations are intact. The ACA and MCA branch vessels are within normal limits. The vertebral arteries are codominant. The PICA origins are visualized and normal. The basilar artery is within normal limits. Both posterior cerebral arteries originate from the basilar tip. PCA branch vessels are intact. MRA of the brain  See CTA  Carotid Doppler  See CTA  2D Echocardiogram Left ventricle: The cavity size was normal. Systolic function was normal. The estimated ejection fraction was in the range of 55% to 60%. Wall motion was normal; there were no regional wall motion abnormalities. CXR  Not done  EKG  normal EKG, normal sinus rhythm, unchanged from previous tracings. For complete results please see formal report.   Therapy Recommendations SNF  TEE 01/01/15 normal  Neurological Examination  Mental Status: Alert, oriented, to name, slight dysarthria.  Cranial Nerves: II: Discs flat bilaterally; Visual fields grossly normal, pupils equal, round, reactive to light and accommodation III,IV, VI: ptosis not present, extra-ocular motions intact bilaterally V,VII: smile symmetric, but minimum left lower nasolabial fold weakness. facial light touch sensation normal bilaterally VIII: hearing normal bilaterally IX,X: gag reflex present XI: bilateral shoulder shrug XII: midline tongue extension Motor: Right : Upper extremity   5/5    Left:     Upper extremity   4/5  Lower extremity   5/5     Lower extremity   4/5 Tone and  bulk:normal tone throughout; no atrophy noted. Diminished fine finger movements on the left. Left grip weak. Orbits right over left upper extremity. Sensory: Pinprick and light touch intact throughout, bilaterally Deep Tendon Reflexes: 1+ and symmetric throughout Plantars: Right: downgoing   Left: downgoing Cerebellar: normal finger-to-nose but slow Gait: not tested         LDL 59     Hospital day # 3  TREATMENT/PLAN 74 Y/O with a past medical history significant for HTN, CAD s/p CABG, carotid disease s/p right CEA, former alcohol abuse, brought to the ED via EMS as a code stroke due to acute onset of L hemiparesis, dysarthria, R gaze preference.   MRI shows a right insular cortex infarct likely embolic etiology to be determined. Symptoms much improved, following commands 4/5 b/l L upper and LE.  Plan mobilize out of bed. Physical occupational therapy and rehabilitation consults.   Continue aspirin and aggressive risk factor modification.  Transfer to rehabilitation for the next few days. Discussed with patient and wife and answered questions.    SIGNED SETHI,PRAMOD    To contact Stroke Continuity provider, please refer to [email or web address] After hours, contact General Neurology

## 2015-01-01 NOTE — Clinical Social Work Placement (Signed)
   CLINICAL SOCIAL WORK PLACEMENT  NOTE  Date:  01/01/2015  Patient Details  Name: [name] MRN: [id number] Date of Birth: 10-10-1940  Clinical Social Work is seeking post-discharge placement for this patient at the [place] level of care (*CSW will initial, date and re-position this form in  chart as items are completed):  Yes   Patient/family provided with [place] Work Department's list of facilities offering this level of care within the geographic area requested by the patient (or if unable, by the patient's family).  Yes   Patient/family informed of their freedom to choose among providers that offer the needed level of care, that participate in Medicare, Medicaid or managed care program needed by the patient, have an available bed and are willing to accept the patient.  Yes   Patient/family informed of [facility]'s ownership interest in [facility] and [facility], as well as of the fact that they are under no obligation to receive care at these facilities.  PASRR submitted to EDS on       PASRR number received on       Existing PASRR number confirmed on 01/01/15     FL2 transmitted to all facilities in geographic area requested by pt/family on 01/01/15     FL2 transmitted to all facilities within larger geographic area on       Patient informed that his/her managed care company has contracts with or will negotiate with certain facilities, including the following:        Yes   Patient/family informed of bed offers received.  Patient chooses bed at       Physician recommends and patient chooses bed at      Patient to be transferred to   on  .  Patient to be transferred to facility by       Patient family notified on   of transfer.  Name of family member notified:        PHYSICIAN Please sign FL2     Additional Comment:    _______________________________________________ Rozell Searing, LCSW 01/01/2015,  11:20 AM

## 2015-01-01 NOTE — Care Management Important Message (Signed)
Important Message  Patient Details  Name: [name] MRN: [id number] Date of Birth: 01/08/1941   Medicare Important Message Given:  Yes    Laura Radilla P Sante Biedermann 01/01/2015, 1:01 PM

## 2015-01-01 NOTE — NC FL2 (Signed)
[place] MEDICAID FL2 LEVEL OF CARE SCREENING TOOL     IDENTIFICATION  Patient Name: [name] Birthdate: [birth year] Sex: male Admission Date (Current Location): 12/29/2014  [facility] and [place] Number:  Herbalist and Address:  [facility], [place] [address], [place],  [address]      Provider Number: [id number]  Attending Physician Name and Address:  Garvin Fila, MD  Relative Name and Phone Number:       Current Level of Care: SNF Recommended Level of Care: [place] Prior Approval Number:    Date Approved/Denied:   PASRR Number: [id number]  Discharge Plan: SNF    Current Diagnoses: Patient Active Problem List   Diagnosis Date Noted  . Stroke due to occlusion of right middle cerebral artery ([place])   . Coronary artery disease involving native coronary artery of native heart without angina pectoris   . History of ETOH abuse   . Tachypnea   . Adjustment disorder with mixed anxiety and depressed mood   . BPH (benign prostatic hyperplasia)   . Hemiparesis affecting dominant side as late effect of stroke ([place])   . Hemiparesis affecting left side as late effect of stroke ([place])   . Hemiparesis and alteration of sensations as late effects of stroke ([place])   . Stroke with cerebral ischemia ([place]) 12/29/2014  . Follow-up ----------- PCP NOTES 09/18/2014  . Hiccups 02/20/2013  . Hyperglycemia 08/19/2012  . BCC (basal cell carcinoma of skin) 10/16/2011  . CAD (coronary artery disease) 05/13/2011  . Hyperlipidemia 05/26/2010  . Annual physical exam 05/26/2010  . GAIT DISTURBANCE 11/25/2009  . Anxiety and depression 02/14/2009  . FATIGUE 02/14/2009  . ALCOHOL ABUSE 12/03/2008  . CAROTID ARTERY DISEASE 12/03/2008  . Osteoarthritis 02/21/2008  . ERECTILE DYSFUNCTION 05/18/2007  . TOBACCO ABUSE 05/18/2007  . BARRETTS ESOPHAGUS 02/21/2007  . BENIGN PROSTATIC HYPERTROPHY, HX OF 02/21/2007  . Essential  hypertension 04/19/2006  . GERD 04/19/2006  . COLONIC POLYPS, BENIGN, HX OF 04/19/2006    Orientation RESPIRATION BLADDER Height & Weight    Self  Normal Incontinent, External catheter 5\' 11"  (180.3 cm) 202 lbs.  BEHAVIORAL SYMPTOMS/MOOD NEUROLOGICAL BOWEL NUTRITION STATUS   (NONE)  (NONE) Continent Diet (HEART HEALTHY)  AMBULATORY STATUS COMMUNICATION OF NEEDS Skin   Limited Assist Verbally Normal                       Personal Care Assistance Level of Assistance  Dressing, Bathing Bathing Assistance: Limited assistance   Dressing Assistance: Limited assistance     Functional Limitations Info   (NONE)          SPECIAL CARE FACTORS FREQUENCY  PT (By licensed PT), OT (By licensed OT)     PT Frequency: 3 OT Frequency: 2            Contractures      Additional Factors Info  Code Status, Allergies Code Status Info: FULL CODE  Allergies Info: SERTRALINE Hcl            Current Medications (01/01/2015):  This is the current hospital active medication list Current Facility-Administered Medications  Medication Dose Route Frequency Provider Last Rate Last Dose  . 0.9 %  sodium chloride infusion   Intravenous Continuous Erma Heritage, [place]      . acetaminophen (TYLENOL) tablet 650 mg  650 mg Oral Q4H PRN Amie Portland, MD      . amLODipine (NORVASC) tablet 10  mg  10 mg Oral Daily Garvin Fila, MD   10 mg at 12/31/14 1549  . aspirin tablet 325 mg  325 mg Oral Daily Garvin Fila, MD   325 mg at 12/31/14 1448  . cholecalciferol (VITAMIN D) tablet 1,000 Units  1,000 Units Oral Daily Garvin Fila, MD   1,000 Units at 12/31/14 1550  . LORazepam (ATIVAN) tablet 0.5 mg  0.5 mg Oral BID PRN Garvin Fila, MD   0.5 mg at 12/31/14 1122  . metoprolol (LOPRESSOR) tablet 50 mg  50 mg Oral BID Garvin Fila, MD   50 mg at 12/31/14 2137  . nitroGLYCERIN (NITROSTAT) SL tablet 0.4 mg  0.4 mg Sublingual Q5 min PRN Garvin Fila, MD      . pantoprazole (PROTONIX)  injection 40 mg  40 mg Intravenous QHS Amie Portland, MD   40 mg at 12/31/14 2137  . pravastatin (PRAVACHOL) tablet 40 mg  40 mg Oral Daily Garvin Fila, MD   40 mg at 12/31/14 1550  . senna-docusate (Senokot-S) tablet 1 tablet  1 tablet Oral QHS PRN Amie Portland, MD      . vitamin E capsule 400 Units  400 Units Oral Daily Garvin Fila, MD         Discharge Medications: Please see discharge summary for a list of discharge medications.  Relevant Imaging Results:  Relevant Lab Results:   Additional Information SSN [id number]  Glendon Axe, MSW, Latanya Presser [phone] 01/01/2015 8:07 AM     I have personally examined this patient, reviewed notes, independently viewed imaging studies, participated in medical decision making and plan of care.  Antony Contras, MD Medical Director [facility] Pager: [phone] 01/01/2015 2:37 PM

## 2015-01-02 ENCOUNTER — Encounter (HOSPITAL_COMMUNITY): Payer: Self-pay | Admitting: Internal Medicine

## 2015-01-02 MED ORDER — CLOPIDOGREL BISULFATE 75 MG PO TABS
75.0000 mg | ORAL_TABLET | Freq: Every day | ORAL | Status: DC
  Administered 2015-01-02: 75 mg via ORAL
  Filled 2015-01-02: qty 1

## 2015-01-02 MED ORDER — LORAZEPAM 0.5 MG PO TABS: 0.5000 mg | ORAL_TABLET | Freq: Two times a day (BID) | ORAL | Status: DC | PRN

## 2015-01-02 MED ORDER — CLOPIDOGREL BISULFATE 75 MG PO TABS: 75.0000 mg | ORAL_TABLET | Freq: Every day | ORAL | Status: DC

## 2015-01-02 NOTE — Progress Notes (Signed)
Occupational Therapy Treatment Patient Details Name: TRYSTIN LUFT MRN: [id number] DOB: 1940-11-22 Today's Date: 01/02/2015    History of present illness 74 yo male admitted with L hemiparesis dysarthria R gaze preference. NIH 11.  PMH: HTN, CAD s/p CABG, Carotid disease s/p R CEA, Etoh abuse hx, R THA, knee surg, foot surg   OT comments  Focus of session on bed mobility and grooming seated at EOB. Pt requiring mod to max assist for bed mobility. Dizziness reported upon initially sitting up, but resolved.  Pt demonstrating continued slow processing and motor planning. SNF continues to be the optimal discharge disposition.  Follow Up Recommendations  SNF;Supervision/Assistance - 24 hour    Equipment Recommendations       Recommendations for Other Services      Precautions / Restrictions Precautions Precautions: Fall Restrictions Weight Bearing Restrictions: No       Mobility Bed Mobility Overal bed mobility: Needs Assistance Bed Mobility: Rolling;Sidelying to Sit;Sit to Sidelying Rolling: Max assist;Mod assist Sidelying to sit: Mod assist     Sit to sidelying: Min assist General bed mobility comments: mod assist to roll R, max to roll L, assist for LEs on/off bed and to raise trunk, pt moves very slowly  Transfers                      Balance Overall balance assessment: Needs assistance Sitting-balance support: Feet supported Sitting balance-Leahy Scale: Fair Sitting balance - Comments: able to release UB support to perform grooming                           ADL Overall ADL's : Needs assistance/impaired Eating/Feeding: Set up;Bed level Eating/Feeding Details (indicate cue type and reason): with L hand, no difficulty locating items visually on tray Grooming: Wash/dry hands;Wash/dry face;Brushing hair;Sitting Grooming Details (indicate cue type and reason): decreased task persistence and thoroughness                                       Vision                     Perception     Praxis      Cognition   Behavior During Therapy: Flat affect Overall Cognitive Status: Impaired/Different from baseline Area of Impairment: Orientation;Attention;Memory;Following commands;Safety/judgement;Awareness;Problem solving Orientation Level: Disoriented to;Situation;Time Current Attention Level: Sustained Memory: Decreased short-term memory  Following Commands: Follows one step commands with increased time (and multimodal cues) Safety/Judgement: Decreased awareness of deficits   Problem Solving: Slow processing;Decreased initiation;Difficulty sequencing;Requires verbal cues;Requires tactile cues General Comments: Pt stating, "I don't know what you want me to do" when given one step direction.    Extremity/Trunk Assessment               Exercises     Shoulder Instructions       General Comments      Pertinent Vitals/ Pain       Pain Assessment: No/denies pain  Home Living                                          Prior Functioning/Environment              Frequency Min 2X/week     Progress Toward  Goals  OT Goals(current goals can now be found in the care plan section)  Progress towards OT goals: Progressing toward goals     Plan Discharge plan remains appropriate    Co-evaluation                 End of Session     Activity Tolerance Patient limited by fatigue (pt requesting to return to supine)   Patient Left in bed;with call bell/phone within reach;with bed alarm set;with SCD's reapplied   Nurse Communication          Time: [id number] OT Time Calculation (min): 30 min  Charges: OT General Charges $OT Visit: 1 Procedure OT Treatments $Self Care/Home Management : 23-37 mins  Malka So 01/02/2015, 10:00 AM  [phone]

## 2015-01-02 NOTE — Clinical Social Work Note (Signed)
Clinical Social Worker facilitated patient discharge including contacting patient family (CSW has contacted pt's friend, [name] several times today and left messages for returned phone call if she has any questions. [name] currently is NOT at bedside) and facility to confirm patient discharge plans. Clinical information faxed to facility and family agreeable with plan. CSW arranged ambulance transport via [facility] to [facility]. RN to call report prior to discharge.  Clinical Social Worker will sign off for now as social work intervention is no longer needed. Please consult [place] again if new need arises.  Glendon Axe, MSW, LCSWA [phone] 01/02/2015 3:27 PM

## 2015-01-02 NOTE — Clinical Social Work Placement (Signed)
   CLINICAL SOCIAL WORK PLACEMENT  NOTE  Date:  01/02/2015  Patient Details  Name: [name] MRN: [id number] Date of Birth: 1940/09/17  Clinical Social Work is seeking post-discharge placement for this patient at the [place] level of care (*CSW will initial, date and re-position this form in  chart as items are completed):  Yes   Patient/family provided with [place] Work Department's list of facilities offering this level of care within the geographic area requested by the patient (or if unable, by the patient's family).  Yes   Patient/family informed of their freedom to choose among providers that offer the needed level of care, that participate in Medicare, Medicaid or managed care program needed by the patient, have an available bed and are willing to accept the patient.  Yes   Patient/family informed of [facility]'s ownership interest in [facility] and [facility], as well as of the fact that they are under no obligation to receive care at these facilities.  PASRR submitted to EDS on       PASRR number received on       Existing PASRR number confirmed on 01/01/15     FL2 transmitted to all facilities in geographic area requested by pt/family on 01/01/15     FL2 transmitted to all facilities within larger geographic area on       Patient informed that his/her managed care company has contracts with or will negotiate with certain facilities, including the following:        Yes   Patient/family informed of bed offers received.  Patient chooses bed at  [facility] and [place] )     Physician recommends and patient chooses bed at      Patient to be transferred to  [facility] and [place] ) on 01/02/15.  Patient to be transferred to facility by  [facility] )     Patient family notified on 01/02/15 of transfer.  Name of family member notified:   (Pt's friend, [name])     PHYSICIAN Please sign FL2      Additional Comment:    _______________________________________________ Rozell Searing, LCSW 01/02/2015, 12:10 PM

## 2015-01-02 NOTE — Progress Notes (Signed)
RN attempted to call report to the receiving nurse, was put on hold for more than 10 minutes. Will continue to try.

## 2015-01-02 NOTE — Progress Notes (Signed)
Report called to the receiving nurse Malachy Mood.

## 2015-01-02 NOTE — Discharge Summary (Signed)
Stroke Discharge Summary  Patient ID: [name]   MRN: [id number]      DOB: October 21, 1940  Date of Admission: 12/29/2014 Date of Discharge: 01/02/2015  Attending Physician:  Garvin Fila, MD, Stroke MD  Consulting Physician(s):      Delice Lesch, MD (Physical Medicine & Rehabtilitation), Trautner Grayer, MD (electrophysiology)  Patient's PCP:  Kathlene November, MD  DISCHARGE DIAGNOSIS:  1. 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 Resultant  L hemiparesis 3. Hypertension 4. Hyperlipidemia 5. Cigarette smoker, advised to stop smoking 6. Coronary artery disease s/p CABG 2013 7. GERD 8. History of ETOH abuse 9. Tachypnea, resolved 10. Panic attacks/depression, Adjustment disorder with mixed anxiety and depressed mood 11. BPH (benign prostatic hyperplas 12. ia)    Past Medical History  Diagnosis Date  . Carotid arterial disease ([place])     s/p RCEA 11/2008 (Note: hx of negative cardiac nuc 03/2009)  . Hypertension   . Colonic polyp     cscopes 08/18/05 tics,polyps.Marland Kitchenall hyperplastic  . GERD (gastroesophageal reflux disease)     Barrets Esop. (per bx 08/2005) and HH f/u Dr.Schooler  . Anal warts   . Anal fissure   . Hemorrhoids   . 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 Panic attack   . Osteoarthritis   . Depression   . H/O ETOH abuse     Former. Quit ~2012  . BPH (benign prostatic hyperplasia)     s/p surgery  . Anxiety and depression 02/14/2009    Qualifier: Diagnosis of  By: Larose Kells MD, [place]    Past Surgical History  Procedure Laterality Date  . Right carotid endartectomy with dacron patch angioplasty  11/2008  . Appendectomy    . Knee surgery    . Foot surgery      fx  . Total hip arthroplasty  2005    R due to OA  . Skin tags excised    . Transurethral resection of prostate    . Cataract  extraction      wears contact lens L, good vision  . Coronary artery bypass graft  03/23/2011    Procedure: CORONARY ARTERY BYPASS GRAFTING (CABG);  Surgeon: Melrose Nakayama, MD;  Location: [facility];  Service: Open Heart Surgery;  Laterality: N/A;  CABG x four using bilateral greater saphenous vein, harvested endoscopically  . Left heart catheterization with coronary angiogram N/A 03/19/2011    Procedure: LEFT HEART CATHETERIZATION WITH CORONARY ANGIOGRAM;  Surgeon: Thayer Headings, MD;  Location: [facility] CATH LAB;  Service: Cardiovascular;  Laterality: N/A;  . Ep implantable device N/A 01/01/2015    Procedure: Loop Recorder Insertion;  Surgeon: Gombos Grayer, MD;  Location: [facility] CV LAB;  Service: Cardiovascular;  Laterality: N/A;  . Tee without cardioversion N/A 01/01/2015    Procedure: TRANSESOPHAGEAL ECHOCARDIOGRAM (TEE) WITH A LOOP;  Surgeon: Pixie Casino, MD;  Location: [facility] END[facility];  Service: Cardiovascular;  Laterality: N/A;      Medication List    STOP taking these medications        aspirin 81 MG tablet      TAKE these medications        amLODipine 10 MG tablet  Commonly known as:  NORVASC  TAKE 1 TABLET BY MOUTH DAILY  clopidogrel 75 MG tablet  Commonly known as:  PLAVIX  Take 1 tablet (75 mg total) by mouth daily.     LORazepam 0.5 MG tablet  Commonly known as:  ATIVAN  Take 1 tablet (0.5 mg total) by mouth 2 (two) times daily as needed for anxiety.     metoprolol 50 MG tablet  Commonly known as:  LOPRESSOR  Take 1 tablet (50 mg total) by mouth 2 (two) times daily.     nitroGLYCERIN 0.4 MG SL tablet  Commonly known as:  NITROSTAT  Place 1 tablet (0.4 mg total) under the tongue every 5 (five) minutes as needed for chest pain.     omeprazole 40 MG capsule  Commonly known as:  PRILOSEC  Take 1 capsule (40 mg total) by mouth daily.     pravastatin 40 MG tablet  Commonly known as:  PRAVACHOL  Take 1 tablet (40 mg total) by mouth daily.     Vitamin D3 1000  UNITS Caps  Take 1 capsule by mouth daily. Takes 1 2000 unit capsule daily.     vitamin E 400 UNIT capsule  Take 400 Units by mouth daily.        LABORATORY STUDIES CBC    Component Value Date/Time   WBC 10.1 12/29/2014 1238   RBC 4.72 12/29/2014 1238   HGB 16.0 12/29/2014 1307   HCT 47.0 12/29/2014 1307   PLT 163 12/29/2014 1238   MCV 95.6 12/29/2014 1238   MCH 31.6 12/29/2014 1238   MCHC 33.0 12/29/2014 1238   RDW 13.1 12/29/2014 1238   LYMPHSABS 3.3 12/29/2014 1238   MONOABS 0.8 12/29/2014 1238   EOSABS 0.2 12/29/2014 1238   BASOSABS 0.0 12/29/2014 1238   CMP    Component Value Date/Time   NA 143 12/29/2014 1307   K 4.2 12/29/2014 1307   CL 109 12/29/2014 1307   CO2 25 12/29/2014 1238   GLUCOSE 86 12/29/2014 1307   GLUCOSE 106* 01/08/2006 1103   BUN 17 12/29/2014 1307   CREATININE 1.10 12/29/2014 1307   CALCIUM 9.1 12/29/2014 1238   PROT 6.8 12/29/2014 1238   ALBUMIN 3.5 12/29/2014 1238   AST 21 12/29/2014 1238   ALT 16* 12/29/2014 1238   ALKPHOS 89 12/29/2014 1238   BILITOT 0.5 12/29/2014 1238   GFRNONAA 55* 12/29/2014 1238   GFRAA >60 12/29/2014 1238   COAGS Lab Results  Component Value Date   INR 1.13 12/29/2014   INR 1.46 03/23/2011   INR 1.12 03/19/2011   Lipid Panel    Component Value Date/Time   CHOL 112 12/30/2014 0320   TRIG 114 12/30/2014 0320   HDL 30* 12/30/2014 0320   CHOLHDL 3.7 12/30/2014 0320   VLDL 23 12/30/2014 0320   LDLCALC 59 12/30/2014 0320   HgbA1C  Lab Results  Component Value Date   HGBA1C 5.5 12/30/2014   Cardiac Panel (last 3 results) No results for input(s): CKTOTAL, CKMB, TROPONINI, RELINDX in the last 72 hours. Urinalysis    Component Value Date/Time   COLORURINE YELLOW 12/29/2014 1805   APPEARANCEUR CLEAR 12/29/2014 1805   LABSPEC 1.010 12/29/2014 1805   PHURINE 6.0 12/29/2014 1805   GLUCOSEU NEGATIVE 12/29/2014 1805   HGBUR TRACE* 12/29/2014 1805   BILIRUBINUR NEGATIVE 12/29/2014 1805   KETONESUR 15*  12/29/2014 1805   PROTEINUR NEGATIVE 12/29/2014 1805   UROBILINOGEN 0.2 03/18/2011 1824   NITRITE NEGATIVE 12/29/2014 1805   LEUKOCYTESUR NEGATIVE 12/29/2014 1805   Urine Drug Screen     Component Value  Date/Time   LABOPIA NONE DETECTED 12/29/2014 1805   COCAINSCRNUR NONE DETECTED 12/29/2014 1805   LABBENZ NONE DETECTED 12/29/2014 1805   AMPHETMU NONE DETECTED 12/29/2014 1805   THCU NONE DETECTED 12/29/2014 1805   LABBARB NONE DETECTED 12/29/2014 1805    Alcohol Level    Component Value Date/Time   ETH <5 12/29/2014 1238     SIGNIFICANT DIAGNOSTIC STUDIES  Ct Head Wo Contrast 12/29/2014   Atrophy with small vessel chronic ischemic changes. Age-indeterminate infarct LEFT frontal region. Questionable tiny lacunar infarcts at the cerebellar hemispheres. No intracranial hemorrhage.   Ct Angio Head & Neck  12/29/2014   1. Age indeterminate infarct involving the anterior left insular cortex and frontal operculum. 2. Moderate atrophy and white matter disease. 3. No other acute cortical infarct. 4. Atherosclerotic calcifications are present in the left common carotid artery the left carotid bifurcation bilateral cavernous internal carotid arteries without significant stenosis. 5. Atherosclerotic calcifications at the origin of the left subclavian artery and right subclavian artery. There is no associated stenosis. 6. Multilevel spondylosis of the cervical spine with foraminal narrowing at multiple levels.   MRI & MRA Head  12/30/2014  1. Acute nonhemorrhagic infarct involving the right insular cortex. 2. Additional punctate areas of acute nonhemorrhagic infarct are present more superiorly in the right frontal lobe with at least 2 lesions along the precentral gyrus. 3. Slightly older acute/subacute nonhemorrhagic infarct involving the anterior left insular cortex and frontal lobe. 4. High-grade stenosis in the superior right M2 division. 5. No significant proximal stenosis, aneurysm, or branch  vessel occlusion within the circle of Willis otherwise. 6. Moderate MCA small vessel disease bilaterally. 7. Moderate generalized atrophy at and periventricular white matter disease bilaterally consistent with chronic small vessel ischemia.    2D Echocardiogram  Left ventricle: The cavity size was normal. Systolic function wasnormal. The estimated ejection fraction was in the range of 55%to 60%. Wall motion was normal; there were no regional wallmotion abnormalities.  EKG normal EKG, normal sinus rhythm, unchanged from previous tracings.   TEE 01/01/15 normal     HISTORY OF PRESENT ILLNESS [name] is a 74 Y/O with a past medical history significant for HTN, CAD s/p CABG, carotid disease s/p right CEA, former alcohol abuse, brought to the ED via EMS as a code stroke due to acute onset of L hemiparesis, dysarthria, R gaze preference. MRI shows a right insular cortex infarct likely embolic etiology to be determined. Symptoms much improved, following commands 4/5 b/l L upper and LE. Plan mobilize out of bed. Physical occupational therapy and rehabilitation consults. Continue aspirin and aggressive risk factor modification. Transfer to rehabilitation for the next few days. Discussed with patient and wife and answered questions.    HOSPITAL COURSE [name] is a 74 y.o. male with history of  HTN, CAD s/p CABG, carotid disease s/p right CEA, former alcohol abuse presenting with L hemiparesis, dysarthria, R gaze preference. He received IV t-PA 12/29/2014 at 1321.   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 small vessel disease   CTA head and neck intracranial atherosclerosis  without significant stenosis. multilevel cervical spondylosis   2D Echo  No source of  embolus   TEE  Loop recorder placed 01/01/2015 by Dr. Rayann Heman  LDL 59  HgbA1c 5.5  aspirin 81 mg daily prior to admission, changed to clopidogrel 75 mg daily at discharge  Patient counseled to be compliant with his antithrombotic medications  Ongoing aggressive stroke risk factor management  Therapy recommendations:  CIR - Dr. Orlie Pollen - unlikely be able to achieve a independent level of functioning after a short IRF stay  Disposition:  Discharge to skilled nursing facility for ongoing PT, OT and ST.   Hypertension  Stable  Hyperlipidemia  Home meds:  pravachol 40, resumed in hospital  LDL 59, goal < 70  Continue statin at discharge  Other Stroke Risk Factors  Advanced age  Cigarette smoker, advised to stop smoking  H/o ETOH use, none current  Family hx stroke (Paternal Grandfather)  Coronary artery disease s/p CABG 2013  Carotid arterial disease, R CEA 2010  Other Active Problems  GERD  Panic attacks/depression   DISCHARGE EXAM Blood pressure 112/59, pulse 71, temperature 97.7 F (36.5 C), temperature source Oral, resp. rate 18, height 5\' 11"  (1.803 m), weight 90.8 kg (200 lb 2.8 oz), SpO2 91 %. Mental Status: Alert, oriented, to name, slight dysarthria.  Cranial Nerves: II: Discs flat bilaterally; Visual fields grossly normal, pupils equal, round, reactive to light and accommodation III,IV, VI: ptosis not present, extra-ocular motions intact bilaterally V,VII: smile symmetric, but minimum left lower nasolabial fold weakness. facial light touch sensation normal bilaterally VIII: hearing normal bilaterally IX,X: gag reflex present XI: bilateral shoulder shrug XII: midline tongue extension Motor: Right :Upper extremity 5/5Left: Upper extremity 4/5 Lower extremity  5/5Lower extremity 4/5 Tone and bulk:normal tone throughout; no atrophy noted. Diminished fine finger movements on the left. Left grip weak. Orbits right over left upper extremity. Sensory: Pinprick and light touch intact throughout, bilaterally Deep Tendon Reflexes: 1+ and symmetric throughout Plantars: Right: downgoingLeft: downgoing Cerebellar: normal finger-to-nose but slow Gait: not tested    Discharge Diet   Diet heart healthy/carb modified Room service appropriate?: Yes; Fluid consistency:: Thin liquids  DISCHARGE PLAN  Disposition:  Discharge to skilled nursing facility for ongoing PT, OT and ST.    clopidogrel 75 mg daily for secondary stroke prevention.  Follow-up Kathlene November, MD in 2 weeks.  Follow-up with Dr. Antony Contras, Stroke Clinic in 2 months.  45 minutes were spent preparing discharge.  [place] for Pager information 01/02/2015 3:00 PM  I have personally examined this patient, reviewed notes, independently viewed imaging studies, participated in medical decision making and plan of care. I have made any additions or clarifications directly to the above note. Agree with note above.   Antony Contras, MD Medical Director [facility] Pager: [phone] 01/02/2015 7:39 PM

## 2015-01-02 NOTE — Progress Notes (Signed)
Physical Therapy Treatment Patient Details Name: [name] MRN: [id number] DOB: 23-Oct-1940 Today's Date: 01/02/2015    History of Present Illness 74 yo male admitted with L hemiparesis dysarthria R gaze preference. NIH 11.  PMH: HTN, CAD s/p CABG, Carotid disease s/p R CEA, Etoh abuse hx, R THA, knee surg, foot surg    PT Comments    Patient with very flat affect and very slow to process and complete tasks as requested. While delayed processing, he can tell you what you've asked him to do and that "he's working on it." Bradykinesia present. Required less assist than previously and able to walk 6 ft   Follow Up Recommendations  SNF     Equipment Recommendations  None recommended by PT    Recommendations for Other Services       Precautions / Restrictions Precautions Precautions: Fall Restrictions Weight Bearing Restrictions: No    Mobility  Bed Mobility Overal bed mobility: Needs Assistance Bed Mobility: Rolling;Sidelying to Sit Rolling: Min assist Sidelying to sit: Mod assist     Sit to sidelying: Min assist General bed mobility comments: HOB flat with rail; very slow processing, however able to do a bit more for himself today  Transfers Overall transfer level: Needs assistance Equipment used: Rolling walker (2 wheeled) Transfers: Sit to/from Stand Sit to Stand: Min assist;Mod assist;+2 physical assistance         General transfer comment: from EOB min assist; from lower recliner with armrests +2 (stated fear of falling)  Ambulation/Gait Ambulation/Gait assistance: Min assist;Mod assist;+2 safety/equipment Ambulation Distance (Feet): 3 Feet (seated rest, 6) Assistive device: Rolling walker (2 wheeled) Gait Pattern/deviations: Step-through pattern;Decreased step length - right;Shuffle;Trunk flexed   Gait velocity interpretation: <1.8 ft/sec, indicative of risk for recurrent falls General Gait Details: increased difficulty when turning (assist to  maneuver RW and max cues for sequencing and advancing RLE); walking straight min assist; looks down at his feet   Stairs            Wheelchair Mobility    Modified Rankin (Stroke Patients Only)       Balance Overall balance assessment: Needs assistance Sitting-balance support: Bilateral upper extremity supported;Feet supported Sitting balance-Leahy Scale: Poor    Standing balance support: Bilateral upper extremity supported Standing balance-Leahy Scale: Poor                      Cognition Arousal/Alertness: Awake/alert Behavior During Therapy: Flat affect Overall Cognitive Status: Impaired/Different from baseline Area of Impairment: Attention;Following commands;Safety/judgement;Awareness;Problem solving Orientation Level: (had OT earlier; new date, day, situation) Current Attention Level: Sustained Following Commands: Follows one step commands with increased time (and multimodal cues) Safety/Judgement: Decreased awareness of deficits Awareness: Intellectual Problem Solving: Slow processing;Decreased initiation;Difficulty sequencing;Requires verbal cues;Requires tactile cues General Comments: slow to initiate and then with bradykinesia; when delayed following commands, pt able to state what you asked him to do and that he's "working on it"    Exercises      General Comments        Pertinent Vitals/Pain Pain Assessment: No/denies pain    Home Living                      Prior Function            PT Goals (current goals can now be found in the care plan section) Acute Rehab PT Goals Time For Goal Achievement: 01/14/15 Progress towards PT goals: Progressing toward goals    Frequency  Min 3X/week    PT Plan Current plan remains appropriate    Co-evaluation             End of Session Equipment Utilized During Treatment: Gait belt Activity Tolerance: Patient tolerated treatment well Patient left: in chair;with call bell/phone  within reach;with chair alarm set     Time: [id number] PT Time Calculation (min) (ACUTE ONLY): 25 min  Charges:  $Gait Training: 23-37 mins                    G Codes:      Issaac [name] 01-25-2015, 11:02 AM Pager [phone]

## 2015-01-02 NOTE — Care Management Note (Addendum)
Case Management Note  Patient Details  Name: [name] MRN: [id number] Date of Birth: 07/20/1940  Subjective/Objective:   Patient admitted with CVA. Patient is from home alone.                  Action/Plan: Plan is to discharge patient to [facility] today. No further needs per CM.   Expected Discharge Date:                  Expected Discharge Plan:  [place]  In-House Referral:     Discharge planning Services     Post Acute Care Choice:    Choice offered to:     DME Arranged:    DME Agency:     HH Arranged:    [place] Agency:     Status of Service:  Completed, signed off  Medicare Important Message Given:  Yes Date Medicare IM Given:    Medicare IM give by:    Date Additional Medicare IM Given:    Additional Medicare Important Message give by:     If discussed at [place] of Stay Meetings, dates discussed:    Additional Comments:  Pollie Friar, RN 01/02/2015, 1:52 PM

## 2015-01-16 ENCOUNTER — Ambulatory Visit: Payer: Medicare PPO

## 2015-01-18 ENCOUNTER — Encounter: Payer: Self-pay | Admitting: Internal Medicine

## 2015-01-30 ENCOUNTER — Encounter: Payer: Self-pay | Admitting: *Deleted

## 2015-01-31 ENCOUNTER — Ambulatory Visit (INDEPENDENT_AMBULATORY_CARE_PROVIDER_SITE_OTHER): Payer: PPO | Admitting: *Deleted

## 2015-01-31 DIAGNOSIS — I639 Cerebral infarction, unspecified: Secondary | ICD-10-CM

## 2015-02-01 NOTE — Progress Notes (Signed)
Carelink Summary Report / Loop Recorder 

## 2015-02-04 ENCOUNTER — Telehealth: Payer: Self-pay | Admitting: Internal Medicine

## 2015-02-04 NOTE — Telephone Encounter (Addendum)
Returned [employer]' call.  She had many questions about billing, advised her that it would be more appropriate to speak to someone in the billing department (gave phone number).  She states she is concerned that she received a letter that patient's loop recorder has not been checked in recommended period of time.  She states that patient's Carelink monitor is plugged in next to his bed at [facility].  Eunice Blase that I will follow-up with the facility regarding Carelink transmissions and she is appreciative.  She denies additional questions or concerns at this time.

## 2015-02-04 NOTE — Telephone Encounter (Signed)
New message      Calling to let the nurse know that pt is at [facility].  He received a message stating that his loop recorder has not been checked.  Please call the rehab center regarding this matter

## 2015-02-04 NOTE — Telephone Encounter (Signed)
Spoke with Lorenso Courier, patient's nurse at [facility].  Advised that I was following up as patient missed his wound check appointment on 01/16/15.  She states that loop recorder insertion site "looks good" as far as she is aware.  She denies questions or concerns at this time.

## 2015-03-04 ENCOUNTER — Ambulatory Visit (INDEPENDENT_AMBULATORY_CARE_PROVIDER_SITE_OTHER): Payer: PPO | Admitting: *Deleted

## 2015-03-04 DIAGNOSIS — I639 Cerebral infarction, unspecified: Secondary | ICD-10-CM

## 2015-03-06 NOTE — Progress Notes (Signed)
Carelink Summary Report / Loop Recorder 

## 2015-03-12 ENCOUNTER — Encounter: Payer: Self-pay | Admitting: Internal Medicine

## 2015-03-16 ENCOUNTER — Encounter: Payer: Self-pay | Admitting: Internal Medicine

## 2015-03-18 ENCOUNTER — Telehealth: Payer: Self-pay | Admitting: *Deleted

## 2015-03-18 ENCOUNTER — Ambulatory Visit: Payer: Medicare PPO | Admitting: Internal Medicine

## 2015-03-18 DIAGNOSIS — I480 Paroxysmal atrial fibrillation: Secondary | ICD-10-CM | POA: Insufficient documentation

## 2015-03-18 MED ORDER — APIXABAN 5 MG PO TABS: 5.0000 mg | ORAL_TABLET | Freq: Two times a day (BID) | ORAL | Status: DC

## 2015-03-18 NOTE — Telephone Encounter (Signed)
Noted new atrial fibrillation on LINQ.  Reviewed with Dr. Rayann Heman, who recommended that patient STOP Plavix and START Eliquis 5mg  BID.    Patient's phone number is out of service.  Spoke with [name] [facility]), and updated her about the atrial fibrillation findings on patient's loop recorder.  Per [name], patient is at [facility].  She verbalizes understanding of plan to start [facility] and denies additional questions at this time.   [place] and spoke with patient's nurse, Geraldo Pitter.  Selena took verbal instructions to STOP Plavix and START Eliquis 5mg  BID per Dr. Rayann Heman.  She denied questions or concerns at this time.  Physical prescription/order to stop Plavix signed by Chanetta Marshall, NP and faxed to [facility] at [phone].  Fax confirmation received.

## 2015-03-19 NOTE — Telephone Encounter (Signed)
Okay with your plan

## 2015-03-19 NOTE — Telephone Encounter (Signed)
Dr Leonie Man,   I have stopped plavix and started eliquis for new onset afib. Please review chart to see if you feel that we should keep on plavix or if you agree with stopping it.

## 2015-03-22 ENCOUNTER — Other Ambulatory Visit: Payer: Self-pay | Admitting: Internal Medicine

## 2015-03-22 LAB — CUP PACEART REMOTE DEVICE CHECK: Date Time Interrogation Session: 20170120000731

## 2015-04-01 ENCOUNTER — Ambulatory Visit (INDEPENDENT_AMBULATORY_CARE_PROVIDER_SITE_OTHER): Payer: PPO | Admitting: *Deleted

## 2015-04-01 DIAGNOSIS — I639 Cerebral infarction, unspecified: Secondary | ICD-10-CM | POA: Diagnosis not present

## 2015-04-02 NOTE — Progress Notes (Signed)
Carelink Summary Report / Loop Recorder 

## 2015-04-11 ENCOUNTER — Other Ambulatory Visit: Payer: Self-pay

## 2015-04-11 NOTE — Patient Outreach (Signed)
Telephone outreach to [facility], patient's residence, to obtain mRS. No answer at nurses station. Will try additional outreaches.    Jacqulynn Cadet  [facility] Care Management Assistant

## 2015-04-15 ENCOUNTER — Other Ambulatory Visit: Payer: Self-pay

## 2015-04-15 NOTE — Patient Outreach (Signed)
Telephone outreach to [facility], patient's residence, to obtain mRS was successfully completed by his RN caregiver, Alfonse Spruce. mRS = 5.    Jacqulynn Cadet  [facility] Care Management Assistant

## 2015-04-18 ENCOUNTER — Inpatient Hospital Stay (HOSPITAL_COMMUNITY)
Admission: EM | Admit: 2015-04-18 | Discharge: 2015-04-23 | DRG: 853 | Disposition: A | Discharge status: Skilled Nursing Facility | Payer: PPO | Source: Ambulatory Visit | Attending: Internal Medicine | Admitting: Internal Medicine

## 2015-04-18 ENCOUNTER — Encounter (HOSPITAL_COMMUNITY): Payer: Self-pay | Admitting: *Deleted

## 2015-04-18 DIAGNOSIS — Z66 Do not resuscitate: Secondary | ICD-10-CM | POA: Diagnosis not present

## 2015-04-18 DIAGNOSIS — I69354 Hemiplegia and hemiparesis following cerebral infarction affecting left non-dominant side: Secondary | ICD-10-CM

## 2015-04-18 DIAGNOSIS — F329 Major depressive disorder, single episode, unspecified: Secondary | ICD-10-CM | POA: Diagnosis present

## 2015-04-18 DIAGNOSIS — E785 Hyperlipidemia, unspecified: Secondary | ICD-10-CM | POA: Diagnosis present

## 2015-04-18 DIAGNOSIS — L8990 Pressure ulcer of unspecified site, unspecified stage: Secondary | ICD-10-CM

## 2015-04-18 DIAGNOSIS — Z951 Presence of aortocoronary bypass graft: Secondary | ICD-10-CM | POA: Diagnosis not present

## 2015-04-18 DIAGNOSIS — A419 Sepsis, unspecified organism: Secondary | ICD-10-CM | POA: Diagnosis present

## 2015-04-18 DIAGNOSIS — I69320 Aphasia following cerebral infarction: Secondary | ICD-10-CM

## 2015-04-18 DIAGNOSIS — I480 Paroxysmal atrial fibrillation: Secondary | ICD-10-CM | POA: Diagnosis present

## 2015-04-18 DIAGNOSIS — F0390 Unspecified dementia without behavioral disturbance: Secondary | ICD-10-CM | POA: Diagnosis present

## 2015-04-18 DIAGNOSIS — I82441 Acute embolism and thrombosis of right tibial vein: Secondary | ICD-10-CM | POA: Diagnosis present

## 2015-04-18 DIAGNOSIS — I2510 Atherosclerotic heart disease of native coronary artery without angina pectoris: Secondary | ICD-10-CM | POA: Diagnosis present

## 2015-04-18 DIAGNOSIS — T148XXA Other injury of unspecified body region, initial encounter: Secondary | ICD-10-CM

## 2015-04-18 DIAGNOSIS — M1990 Unspecified osteoarthritis, unspecified site: Secondary | ICD-10-CM | POA: Diagnosis present

## 2015-04-18 DIAGNOSIS — F410 Panic disorder [episodic paroxysmal anxiety] without agoraphobia: Secondary | ICD-10-CM | POA: Diagnosis present

## 2015-04-18 DIAGNOSIS — F17210 Nicotine dependence, cigarettes, uncomplicated: Secondary | ICD-10-CM | POA: Diagnosis present

## 2015-04-18 DIAGNOSIS — L89153 Pressure ulcer of sacral region, stage 3: Secondary | ICD-10-CM | POA: Diagnosis present

## 2015-04-18 DIAGNOSIS — I96 Gangrene, not elsewhere classified: Secondary | ICD-10-CM | POA: Diagnosis present

## 2015-04-18 DIAGNOSIS — R451 Restlessness and agitation: Secondary | ICD-10-CM | POA: Diagnosis present

## 2015-04-18 DIAGNOSIS — T148 Other injury of unspecified body region: Secondary | ICD-10-CM | POA: Diagnosis not present

## 2015-04-18 DIAGNOSIS — S31000A Unspecified open wound of lower back and pelvis without penetration into retroperitoneum, initial encounter: Secondary | ICD-10-CM | POA: Diagnosis not present

## 2015-04-18 DIAGNOSIS — F418 Other specified anxiety disorders: Secondary | ICD-10-CM | POA: Diagnosis not present

## 2015-04-18 DIAGNOSIS — I63511 Cerebral infarction due to unspecified occlusion or stenosis of right middle cerebral artery: Secondary | ICD-10-CM | POA: Diagnosis present

## 2015-04-18 DIAGNOSIS — L089 Local infection of the skin and subcutaneous tissue, unspecified: Secondary | ICD-10-CM

## 2015-04-18 DIAGNOSIS — K219 Gastro-esophageal reflux disease without esophagitis: Secondary | ICD-10-CM | POA: Diagnosis present

## 2015-04-18 DIAGNOSIS — R600 Localized edema: Secondary | ICD-10-CM

## 2015-04-18 DIAGNOSIS — E872 Acidosis: Secondary | ICD-10-CM | POA: Diagnosis present

## 2015-04-18 DIAGNOSIS — I82541 Chronic embolism and thrombosis of right tibial vein: Secondary | ICD-10-CM | POA: Diagnosis present

## 2015-04-18 DIAGNOSIS — I10 Essential (primary) hypertension: Secondary | ICD-10-CM | POA: Diagnosis present

## 2015-04-18 DIAGNOSIS — Z85828 Personal history of other malignant neoplasm of skin: Secondary | ICD-10-CM | POA: Diagnosis not present

## 2015-04-18 DIAGNOSIS — I82401 Acute embolism and thrombosis of unspecified deep veins of right lower extremity: Secondary | ICD-10-CM

## 2015-04-18 DIAGNOSIS — Z7901 Long term (current) use of anticoagulants: Secondary | ICD-10-CM | POA: Diagnosis not present

## 2015-04-18 DIAGNOSIS — F419 Anxiety disorder, unspecified: Secondary | ICD-10-CM | POA: Diagnosis present

## 2015-04-18 DIAGNOSIS — F17200 Nicotine dependence, unspecified, uncomplicated: Secondary | ICD-10-CM | POA: Diagnosis present

## 2015-04-18 DIAGNOSIS — F1011 Alcohol abuse, in remission: Secondary | ICD-10-CM | POA: Diagnosis present

## 2015-04-18 LAB — COMPREHENSIVE METABOLIC PANEL
ALBUMIN: 2 g/dL — AB (ref 3.5–5.0)
ALT: 48 U/L (ref 17–63)
ANION GAP: 7 (ref 5–15)
AST: 48 U/L — ABNORMAL HIGH (ref 15–41)
Alkaline Phosphatase: 67 U/L (ref 38–126)
BILIRUBIN TOTAL: 0.1 mg/dL — AB (ref 0.3–1.2)
BUN: 22 mg/dL — ABNORMAL HIGH (ref 6–20)
CO2: 27 mmol/L (ref 22–32)
Calcium: 8.4 mg/dL — ABNORMAL LOW (ref 8.9–10.3)
Chloride: 109 mmol/L (ref 101–111)
Creatinine, Ser: 0.91 mg/dL (ref 0.61–1.24)
GFR calc Af Amer: >60 mL/min (ref >60)
Glucose, Bld: 105 mg/dL — ABNORMAL HIGH (ref 65–99)
POTASSIUM: 4.3 mmol/L (ref 3.5–5.1)
Sodium: 143 mmol/L (ref 135–145)
TOTAL PROTEIN: 6.7 g/dL (ref 6.5–8.1)

## 2015-04-18 LAB — CBC WITH DIFFERENTIAL/PLATELET
BASOS ABS: 0 10*3/uL (ref 0.0–0.1)
Basophils Relative: 0 %
Eosinophils Absolute: 0.2 10*3/uL (ref 0.0–0.7)
Eosinophils Relative: 1 %
HEMATOCRIT: 37.5 % — AB (ref 39.0–52.0)
Hemoglobin: 11.8 g/dL — ABNORMAL LOW (ref 13.0–17.0)
LYMPHS ABS: 2.3 10*3/uL (ref 0.7–4.0)
Lymphocytes Relative: 13 %
MCH: 30.3 pg (ref 26.0–34.0)
MCHC: 31.5 g/dL (ref 30.0–36.0)
MCV: 96.2 fL (ref 78.0–100.0)
MONO ABS: 1.4 10*3/uL — AB (ref 0.1–1.0)
MONOS PCT: 8 %
NEUTROS ABS: 14 10*3/uL — AB (ref 1.7–7.7)
Neutrophils Relative %: 78 %
Platelets: 210 10*3/uL (ref 150–400)
RBC: 3.9 MIL/uL — ABNORMAL LOW (ref 4.22–5.81)
RDW: 15.6 % — AB (ref 11.5–15.5)
WBC: 17.9 10*3/uL — ABNORMAL HIGH (ref 4.0–10.5)

## 2015-04-18 LAB — PROTIME-INR
INR: 1.55 — AB (ref 0.00–1.49)
PROTHROMBIN TIME: 18 s — AB (ref 11.6–15.2)

## 2015-04-18 LAB — URINALYSIS, ROUTINE W REFLEX MICROSCOPIC
BILIRUBIN URINE: NEGATIVE
GLUCOSE, UA: NEGATIVE mg/dL
HGB URINE DIPSTICK: NEGATIVE
KETONES UR: NEGATIVE mg/dL
Leukocytes, UA: NEGATIVE
NITRITE: NEGATIVE
PH: 7 (ref 5.0–8.0)
Protein, ur: NEGATIVE mg/dL
Specific Gravity, Urine: 1.028 (ref 1.005–1.030)

## 2015-04-18 LAB — I-STAT CG4 LACTIC ACID, ED: LACTIC ACID, VENOUS: 2.43 mmol/L — AB (ref 0.5–2.0)

## 2015-04-18 LAB — MRSA PCR SCREENING: MRSA BY PCR: NEGATIVE

## 2015-04-18 LAB — APTT: aPTT: 32 seconds (ref 24–37)

## 2015-04-18 LAB — PROCALCITONIN

## 2015-04-18 MED ORDER — ONDANSETRON HCL 4 MG/2ML IJ SOLN: 4.0000 mg | Freq: Three times a day (TID) | INTRAMUSCULAR | Status: DC | PRN

## 2015-04-18 MED ORDER — SODIUM CHLORIDE 0.9 % IV BOLUS (SEPSIS)
1000.0000 mL | Freq: Once | INTRAVENOUS | Status: AC
  Administered 2015-04-18: 1000 mL via INTRAVENOUS

## 2015-04-18 MED ORDER — OXYCODONE-ACETAMINOPHEN 5-325 MG PO TABS
1.0000 | ORAL_TABLET | ORAL | Status: DC | PRN
  Administered 2015-04-19 – 2015-04-22 (×8): 1 via ORAL
  Filled 2015-04-18 (×9): qty 1

## 2015-04-18 MED ORDER — SODIUM CHLORIDE 0.9 % IV SOLN
INTRAVENOUS | Status: DC
  Administered 2015-04-18: via INTRAVENOUS
  Administered 2015-04-19: 100 mL/h via INTRAVENOUS
  Administered 2015-04-20: 01:00:00 via INTRAVENOUS

## 2015-04-18 MED ORDER — METOPROLOL TARTRATE 50 MG PO TABS
50.0000 mg | ORAL_TABLET | Freq: Two times a day (BID) | ORAL | Status: DC
  Administered 2015-04-19 – 2015-04-23 (×10): 50 mg via ORAL
  Filled 2015-04-18 (×10): qty 1

## 2015-04-18 MED ORDER — VITAMIN D 1000 UNITS PO TABS
2000.0000 [IU] | ORAL_TABLET | Freq: Every day | ORAL | Status: DC
  Administered 2015-04-19 – 2015-04-23 (×5): 2000 [IU] via ORAL
  Filled 2015-04-18 (×7): qty 2

## 2015-04-18 MED ORDER — PIPERACILLIN-TAZOBACTAM 3.375 G IVPB 30 MIN
3.3750 g | Freq: Once | INTRAVENOUS | Status: AC
  Administered 2015-04-18: 3.375 g via INTRAVENOUS
  Filled 2015-04-18: qty 50

## 2015-04-18 MED ORDER — PIPERACILLIN-TAZOBACTAM 3.375 G IVPB
3.3750 g | Freq: Three times a day (TID) | INTRAVENOUS | Status: DC
  Administered 2015-04-19 – 2015-04-22 (×11): 3.375 g via INTRAVENOUS
  Filled 2015-04-18 (×10): qty 50

## 2015-04-18 MED ORDER — PRAVASTATIN SODIUM 40 MG PO TABS
40.0000 mg | ORAL_TABLET | Freq: Every day | ORAL | Status: DC
  Administered 2015-04-19 – 2015-04-23 (×5): 40 mg via ORAL
  Filled 2015-04-18 (×5): qty 1

## 2015-04-18 MED ORDER — NITROGLYCERIN 0.4 MG SL SUBL: 0.4000 mg | SUBLINGUAL_TABLET | SUBLINGUAL | Status: DC | PRN

## 2015-04-18 MED ORDER — VANCOMYCIN HCL 10 G IV SOLR
1250.0000 mg | Freq: Two times a day (BID) | INTRAVENOUS | Status: DC
  Administered 2015-04-19 – 2015-04-21 (×5): 1250 mg via INTRAVENOUS
  Filled 2015-04-18 (×7): qty 1250

## 2015-04-18 MED ORDER — PANTOPRAZOLE SODIUM 40 MG PO TBEC
40.0000 mg | DELAYED_RELEASE_TABLET | Freq: Every day | ORAL | Status: DC
  Administered 2015-04-19 – 2015-04-23 (×5): 40 mg via ORAL
  Filled 2015-04-18 (×5): qty 1

## 2015-04-18 MED ORDER — DIVALPROEX SODIUM 125 MG PO CSDR
125.0000 mg | DELAYED_RELEASE_CAPSULE | Freq: Two times a day (BID) | ORAL | Status: DC
  Administered 2015-04-19 – 2015-04-23 (×10): 125 mg via ORAL
  Filled 2015-04-18 (×12): qty 1

## 2015-04-18 MED ORDER — SODIUM CHLORIDE 0.9% FLUSH
3.0000 mL | Freq: Two times a day (BID) | INTRAVENOUS | Status: DC
  Administered 2015-04-20 – 2015-04-21 (×2): 3 mL via INTRAVENOUS

## 2015-04-18 MED ORDER — HYDRALAZINE HCL 20 MG/ML IJ SOLN: 5.0000 mg | INTRAMUSCULAR | Status: DC | PRN

## 2015-04-18 MED ORDER — LORAZEPAM 0.5 MG PO TABS: 0.5000 mg | ORAL_TABLET | Freq: Two times a day (BID) | ORAL | Status: DC | PRN

## 2015-04-18 MED ORDER — TRAMADOL HCL 50 MG PO TABS
50.0000 mg | ORAL_TABLET | Freq: Four times a day (QID) | ORAL | Status: DC | PRN
Start: 2015-04-18 — End: 2015-04-20

## 2015-04-18 MED ORDER — NICOTINE 21 MG/24HR TD PT24
21.0000 mg | MEDICATED_PATCH | Freq: Every day | TRANSDERMAL | Status: DC
  Administered 2015-04-19 – 2015-04-20 (×3): 21 mg via TRANSDERMAL
  Filled 2015-04-18 (×3): qty 1

## 2015-04-18 MED ORDER — ACETAMINOPHEN 650 MG RE SUPP: 650.0000 mg | Freq: Four times a day (QID) | RECTAL | Status: DC | PRN

## 2015-04-18 MED ORDER — VANCOMYCIN HCL IN DEXTROSE 1-5 GM/200ML-% IV SOLN
1000.0000 mg | Freq: Once | INTRAVENOUS | Status: AC
  Administered 2015-04-18: 1000 mg via INTRAVENOUS
  Filled 2015-04-18: qty 200

## 2015-04-18 MED ORDER — ACETAMINOPHEN 325 MG PO TABS
650.0000 mg | ORAL_TABLET | Freq: Four times a day (QID) | ORAL | Status: DC | PRN
  Administered 2015-04-20: 650 mg via ORAL
  Filled 2015-04-18: qty 2

## 2015-04-18 MED ORDER — PANTOPRAZOLE SODIUM 40 MG PO TBEC: 40.0000 mg | DELAYED_RELEASE_TABLET | Freq: Every day | ORAL | Status: DC

## 2015-04-18 MED ORDER — VITAMIN E 180 MG (400 UNIT) PO CAPS
400.0000 [IU] | ORAL_CAPSULE | Freq: Every day | ORAL | Status: DC
  Administered 2015-04-19 – 2015-04-23 (×5): 400 [IU] via ORAL
  Filled 2015-04-18 (×5): qty 1

## 2015-04-18 NOTE — Progress Notes (Signed)
Pharmacy Antibiotic Follow-up Note  [name] is a 75 y.o. year-old male admitted on 04/18/2015.  The patient is currently on day 1 of Vancomycin & Zosyn for sacral wound infection.  Assessment/Plan: Vancomycin 1gm x1, then 1250mg  IV every 12 hours.  Goal trough 15-20 mcg/mL. Zosyn 3.375g IV q8h (4 hour infusion).  Temp (24hrs), Avg:99.2 F (37.3 C), Min:98.2 F (36.8 C), Max:100.1 F (37.8 C)  No results for input(s): WBC in the last 168 hours.  Invalid input(s):  CREATININE No results for input(s): CREATININE in the last 168 hours. CrCl cannot be calculated (Unknown ideal weight.).    Allergies  Allergen Reactions  . Sertraline Hcl     REACTION: heart palpitation    Antimicrobials this admission: 4/6 Vancomycin  >>  4/6 Zosyn  >>   Levels/dose changes this admission:  Microbiology results: 4/6 BCx: sent 4/6 UCx: sent  Thank you for allowing pharmacy to be a part of this patient's care.  Minda Ditto PharmD 04/18/2015 5:17 PM

## 2015-04-18 NOTE — ED Provider Notes (Signed)
CSN: [id number]     Arrival date & time 04/18/15  1619 History   First MD Initiated Contact with Patient 04/18/15 1627     Chief Complaint  Patient presents with  . Wound Check     (Consider location/radiation/quality/duration/timing/severity/associated sxs/prior Treatment) HPI Comments: 75 year old male with a history of stroke with resultant left hemiparesis, atrial fibrillation, hypertension, hyperlipidemia, coronary artery disease status post CABG, depression, history of alcohol abuse, who presents with concern for worsening sacral wound from [facility]. Unclear if pt is reliable historian, as he is not oriented to day/location, however reports 3 weeks of worsening pain to his sacrum.  Pain is moderate, worse/severe with palpation.  PCP sent him here for surgical debridement.  Per SNF, he speaks and you think he is oriented but he will say something off, sometimes just stares and is not responsive, waxes and wanes in terms of coherence, has some confusion at baseline due to stroke  Level V caveat-dementia/prior stroke with chronic disorientation  Patient is a 75 y.o. male presenting with wound check.  Wound Check Pertinent negatives include no chest pain, no abdominal pain, no headaches and no shortness of breath.    Past Medical History  Diagnosis Date  . Carotid arterial disease ([place])     s/p RCEA 11/2008 (Note: hx of negative cardiac nuc 03/2009)  . Hypertension   . Colonic polyp     cscopes 08/18/05 tics,polyps.Marland Kitchenall hyperplastic  . GERD (gastroesophageal reflux disease)     Barrets Esop. (per bx 08/2005) and HH f/u Dr.Schooler  . Anal warts   . Anal fissure   . Hemorrhoids   . 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 Panic attack   . Osteoarthritis   . Depression   . H/O ETOH abuse     Former. Quit ~2012  . BPH (benign prostatic hyperplasia)     s/p surgery  . Anxiety and depression 02/14/2009    Qualifier: Diagnosis of  By: Larose Kells MD, [place]    Past Surgical History  Procedure Laterality Date  . Right carotid endartectomy with dacron patch angioplasty  11/2008  . Appendectomy    . Knee surgery    . Foot surgery      fx  . Total hip arthroplasty  2005    R due to OA  . Skin tags excised    . Transurethral resection of prostate    . Cataract extraction      wears contact lens L, good vision  . Coronary artery bypass graft  03/23/2011    Procedure: CORONARY ARTERY BYPASS GRAFTING (CABG);  Surgeon: Melrose Nakayama, MD;  Location: [place];  Service: Open Heart Surgery;  Laterality: N/A;  CABG x four using bilateral greater saphenous vein, harvested endoscopically  . Left heart catheterization with coronary angiogram N/A 03/19/2011    Procedure: LEFT HEART CATHETERIZATION WITH CORONARY ANGIOGRAM;  Surgeon: Thayer Headings, MD;  Location[facility] CATH LAB;  Service: Cardiovascular;  Laterality: N/A;  . Ep implantable device N/A 01/01/2015    Procedure: Loop Recorder Insertion;  Surgeon: Strick Grayer, MD;  Location: [place] CV LAB;  Service: Cardiovascular;  Laterality: N/A;  . Tee without cardioversion N/A 01/01/2015    Procedure: TRANSESOPHAGEAL ECHOCARDIOGRAM (TEE) WITH A LOOP;  Surgeon: Pixie Casino, MD;  Location: [facility] ENDOSCOPY;  Service: Cardiovascular;  Laterality: N/A;   Family History  Problem Relation Age of Onset  . Lung cancer Father  mets to brain- deceased  . Arthritis Mother   . Hypertension Mother   . Coronary artery disease Neg Hx   . Colon cancer Neg Hx   . Prostate cancer Neg Hx   . Diabetes Paternal Aunt   . Stroke Paternal Grandfather    Social History  Substance Use Topics  . Smoking status: Current Some Day Smoker -- 1.00 packs/day for 52 years    Types: Cigarettes  . Smokeless tobacco: Never Used     Comment: < 1 ppd   . Alcohol Use: No     Comment: currently not drinking, former abuse     Review of Systems  Constitutional: Negative for fever.  HENT: Negative for sore throat.    Eyes: Negative for visual disturbance.  Respiratory: Negative for cough and shortness of breath.   Cardiovascular: Negative for chest pain.  Gastrointestinal: Negative for nausea, vomiting and abdominal pain.  Genitourinary: Negative for dysuria and difficulty urinating.  Musculoskeletal: Negative for back pain and neck stiffness.  Skin: Positive for wound. Negative for rash.  Neurological: Negative for syncope and headaches.      Allergies  Sertraline hcl  Home Medications   Prior to Admission medications   Medication Sig Start Date End Date Taking? Authorizing Provider  amLODipine (NORVASC) 10 MG tablet Take 1 tablet (10 mg total) by mouth daily. 03/22/15  Yes Colon Branch, MD  atorvastatin (LIPITOR) 10 MG tablet Take 10 mg by mouth daily.   Yes Historical Provider, MD  Cholecalciferol (VITAMIN D) 2000 units tablet Take 2,000 Units by mouth daily.   Yes Historical Provider, MD  clopidogrel (PLAVIX) 75 MG tablet Take 75 mg by mouth daily.   Yes Historical Provider, MD  divalproex (DEPAKOTE SPRINKLE) 125 MG capsule Take 125 mg by mouth 2 (two) times daily.    Yes Historical Provider, MD  LORazepam (ATIVAN) 0.5 MG tablet Take 1 tablet (0.5 mg total) by mouth 2 (two) times daily as needed for anxiety. 01/02/15  Yes Donzetta Starch, NP  metoprolol (LOPRESSOR) 50 MG tablet Take 1 tablet (50 mg total) by mouth 2 (two) times daily. 06/28/14  Yes Minus Breeding, MD  nitroGLYCERIN (NITROSTAT) 0.4 MG SL tablet Place 1 tablet (0.4 mg total) under the tongue every 5 (five) minutes as needed for chest pain. 12/31/11  Yes Minus Breeding, MD  omeprazole (PRILOSEC) 40 MG capsule Take 1 capsule (40 mg total) by mouth daily. 08/03/14  Yes Colon Branch, MD  traMADol (ULTRAM) 50 MG tablet Take 50 mg by mouth every 6 (six) hours as needed for moderate pain or severe pain.    Yes Historical Provider, MD  vitamin E 400 UNIT capsule Take 400 Units by mouth daily.   Yes Historical Provider, MD  apixaban (ELIQUIS) 5  MG TABS tablet Take 1 tablet (5 mg total) by mouth 2 (two) times daily. 03/18/15   Amber Sena Slate, NP  pravastatin (PRAVACHOL) 40 MG tablet Take 1 tablet (40 mg total) by mouth daily. 06/22/14   Colon Branch, MD   BP 108/60 mmHg  Pulse 94  Temp(Src) 100.1 F (37.8 C) (Rectal)  Resp 26  SpO2 97% Physical Exam  Constitutional: He appears well-developed and well-nourished. No distress.  HENT:  Head: Normocephalic and atraumatic.  Mouth/Throat: No oropharyngeal exudate.  Eyes: Conjunctivae and EOM are normal.  Neck: Normal range of motion.  Cardiovascular: Regular rhythm, normal heart sounds and intact distal pulses.  Tachycardia present.  Exam reveals no gallop and no friction rub.  No murmur heard. Pulmonary/Chest: Effort normal and breath sounds normal. No respiratory distress. He has no wheezes. He has no rales.  Abdominal: Soft. He exhibits no distension. There is no tenderness. There is no guarding.  Musculoskeletal: He exhibits no edema.  Neurological: He is alert. GCS eye subscore is 4. GCS verbal subscore is 4. GCS motor subscore is 6.  Oriented to self "Harrie Jeans" States we are in woods Does not state date (SNF reports this is baseline)  Skin: Skin is warm and dry. He is not diaphoretic.  4cm diameter gaping sacral wound, 1-2cm undermining, exposure muscle, foul smell, purulent yellow exudate, pale area at inferior portion with question of crepitus   Nursing note and vitals reviewed.   ED Course  Procedures (including critical care time) Labs Review Labs Reviewed  CBC WITH DIFFERENTIAL/PLATELET - Abnormal; Notable for the following:    WBC 17.9 (*)    RBC 3.90 (*)    Hemoglobin 11.8 (*)    HCT 37.5 (*)    RDW 15.6 (*)    Neutro Abs 14.0 (*)    Monocytes Absolute 1.4 (*)    All other components within normal limits  COMPREHENSIVE METABOLIC PANEL - Abnormal; Notable for the following:    Glucose, Bld 105 (*)    BUN 22 (*)    Calcium 8.4 (*)    Albumin 2.0 (*)    AST 48  (*)    Total Bilirubin 0.1 (*)    All other components within normal limits  URINALYSIS, ROUTINE W REFLEX MICROSCOPIC (NOT AT [facility]) - Abnormal; Notable for the following:    APPearance HAZY (*)    All other components within normal limits  I-STAT CG4 LACTIC ACID, ED - Abnormal; Notable for the following:    Lactic Acid, Venous 2.43 (*)    All other components within normal limits  CULTURE, BLOOD (ROUTINE X 2)  CULTURE, BLOOD (ROUTINE X 2)  URINE CULTURE  I-STAT CG4 LACTIC ACID, ED    Imaging Review No results found. I have personally reviewed and evaluated these images and lab results as part of my medical decision-making.   EKG Interpretation None      MDM   Final diagnoses:  Sacral wound, initial encounter  Gangrene of buttock ([place])    75 year old male with a history of stroke with resultant left hemiparesis, atrial fibrillation, hypertension, hyperlipidemia, coronary artery disease status post CABG, depression, history of alcohol abuse, who presents with concern for worsening sacral wound from [facility].  Exam concerning for question of gangrenous vs other infected sacral wound, with foul smell, small area with question of crepitus and feel it requires surgical debridement. Consulted General Surgery, Dr. Marlou Starks who reports they will consult on the patient.  Patient with a temperature 100.1 rectally, elevated lactate, and could sepsis was initiated. He was given vancomycin and Zosyn for concern of likely wound infection. Urinalysis showed no UTI. No cough to suggest pneumonia. Discussed with Dr. Blaine Hamper who will admit the patient and Dr. Marlou Starks to evaluate tonight.      Gareth Morgan, MD 04/19/15 [phone]

## 2015-04-18 NOTE — Progress Notes (Signed)
Utilization Review completed.  Natsha Guidry RN CM  

## 2015-04-18 NOTE — ED Notes (Signed)
[place], EDP made aware of patient lactic result.

## 2015-04-18 NOTE — H&P (Signed)
[facility] History and Physical  [name] [id number] DOB: Nov 28, 1940 DOA: 04/18/2015  Referring physician: ED physician PCP: Nicolas November, MD  Specialists:   Chief Complaint: sacral wound infection  HPI: [name] is a 75 y.o. male with PMH of stroke with left-sided hemiparalysis, CAD, s/p of CABG, hypertension, hyperlipidemia, GERD, depression, anxiety, tobacco abuse, alcohol abuse, carotid artery stenosis (s/p of RCEA), skin cancer, PAF on Eliquis, BPH, who presents with sacral wound infection.  Pt is from [facility]. He is a poor historian. It seems that pt has a sacral wound for long time. He has worsening pain to his sacrum in the past 3 weeks. Pt has fever. PCP sent him to hospital for possible surgical debridement. Pt denies chest pain, cough, shortness of breath, abdominal pain, diarrhea, nausea, vomiting. He has left-sided hemiparalysis from previous stroke. Per SNF, pt has some confusion at baseline due to stroke, sometimes pt just stares and is not responsive, waxes and wanes in terms of coherence.  In ED, patient was found to have WBC 17.9, lactate 2.43, temperature 100.1, tachycardia, tachypnea, electrolytes and renal function okay, negative urinalysis. Patient's admitted to inpatient for further evaluation and treatment. General surgery was consulted.  EKG: Independently reviewed. Poor quality of EKG, heard or to determine QTc interval, occasional PVC, early R-wave progression.  Where does patient live? SNF Can patient participate in ADLs? None  Review of Systems:   General: has fevers, no chills, no changes in body weight HEENT: no blurry vision, hearing changes or sore throat Pulm: no dyspnea, coughing, wheezing CV: no chest pain, no palpitations Abd: no nausea, vomiting, abdominal pain, diarrhea, constipation GU: no dysuria, burning on urination, increased urinary frequency, hematuria  Ext: has right leg edema Neuro: no unilateral weakness,  numbness, or tingling, no vision change or hearing loss Skin: has sacral wound.  MSK: No muscle spasm, no deformity, no limitation of range of movement in spin Heme: No easy bruising.  Travel history: No recent long distant travel.  Allergy:  Allergies  Allergen Reactions  . Sertraline Hcl     REACTION: heart palpitation     Past Medical History  Diagnosis Date  . Carotid arterial disease ([place])     s/p RCEA 11/2008 (Note: hx of negative cardiac nuc 03/2009)  . Hypertension   . Colonic polyp     cscopes 08/18/05 tics,polyps.Marland Kitchenall hyperplastic  . GERD (gastroesophageal reflux disease)     Barrets Esop. (per bx 08/2005) and HH f/u Dr.Schooler  . Anal warts   . Anal fissure   . Hemorrhoids   . 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 Panic attack   . Osteoarthritis   . Depression   . H/O ETOH abuse     Former. Quit ~2012  . BPH (benign prostatic hyperplasia)     s/p surgery  . Anxiety and depression 02/14/2009    Qualifier: Diagnosis of  By: Nicolas Kells MD, [place]     Past Surgical History  Procedure Laterality Date  . Right carotid endartectomy with dacron patch angioplasty  11/2008  . Appendectomy    . Knee surgery    . Foot surgery      fx  . Total hip arthroplasty  2005    R due to OA  . Skin tags excised    . Transurethral resection of prostate    . Cataract extraction      wears contact lens L, good  vision  . Coronary artery bypass graft  03/23/2011    Procedure: CORONARY ARTERY BYPASS GRAFTING (CABG);  Surgeon: Melrose Nakayama, MD;  Location: [place];  Service: Open Heart Surgery;  Laterality: N/A;  CABG x four using bilateral greater saphenous vein, harvested endoscopically  . Left heart catheterization with coronary angiogram N/A 03/19/2011    Procedure: LEFT HEART CATHETERIZATION WITH CORONARY ANGIOGRAM;  Surgeon: Thayer Headings, MD;  Location: [facility] CATH LAB;  Service: Cardiovascular;  Laterality: N/A;  . Ep implantable device N/A  01/01/2015    Procedure: Loop Recorder Insertion;  Surgeon: Baena Grayer, MD;  Location: [place] CV LAB;  Service: Cardiovascular;  Laterality: N/A;  . Tee without cardioversion N/A 01/01/2015    Procedure: TRANSESOPHAGEAL ECHOCARDIOGRAM (TEE) WITH A LOOP;  Surgeon: Pixie Casino, MD;  Location: [facility] ENDOSCOPY;  Service: Cardiovascular;  Laterality: N/A;    Social History:  reports that he has been smoking Cigarettes.  He has a 52 pack-year smoking history. He has never used smokeless tobacco. He reports that he does not drink alcohol or use illicit drugs.  Family History:  Family History  Problem Relation Age of Onset  . Lung cancer Father     mets to brain- deceased  . Arthritis Mother   . Hypertension Mother   . Coronary artery disease Neg Hx   . Colon cancer Neg Hx   . Prostate cancer Neg Hx   . Diabetes Paternal Aunt   . Stroke Paternal Grandfather      Prior to Admission medications   Medication Sig Start Date End Date Taking? Authorizing Provider  amLODipine (NORVASC) 10 MG tablet Take 1 tablet (10 mg total) by mouth daily. 03/22/15  Yes Colon Branch, MD  atorvastatin (LIPITOR) 10 MG tablet Take 10 mg by mouth daily.   Yes Historical Provider, MD  Cholecalciferol (VITAMIN D) 2000 units tablet Take 2,000 Units by mouth daily.   Yes Historical Provider, MD  clopidogrel (PLAVIX) 75 MG tablet Take 75 mg by mouth daily.   Yes Historical Provider, MD  divalproex (DEPAKOTE SPRINKLE) 125 MG capsule Take 125 mg by mouth 2 (two) times daily.    Yes Historical Provider, MD  LORazepam (ATIVAN) 0.5 MG tablet Take 1 tablet (0.5 mg total) by mouth 2 (two) times daily as needed for anxiety. 01/02/15  Yes Donzetta Starch, NP  metoprolol (LOPRESSOR) 50 MG tablet Take 1 tablet (50 mg total) by mouth 2 (two) times daily. 06/28/14  Yes Minus Breeding, MD  nitroGLYCERIN (NITROSTAT) 0.4 MG SL tablet Place 1 tablet (0.4 mg total) under the tongue every 5 (five) minutes as needed for chest pain. 12/31/11   Yes Minus Breeding, MD  omeprazole (PRILOSEC) 40 MG capsule Take 1 capsule (40 mg total) by mouth daily. 08/03/14  Yes Colon Branch, MD  traMADol (ULTRAM) 50 MG tablet Take 50 mg by mouth every 6 (six) hours as needed for moderate pain or severe pain.    Yes Historical Provider, MD  vitamin E 400 UNIT capsule Take 400 Units by mouth daily.   Yes Historical Provider, MD  apixaban (ELIQUIS) 5 MG TABS tablet Take 1 tablet (5 mg total) by mouth 2 (two) times daily. 03/18/15   Amber Sena Slate, NP  pravastatin (PRAVACHOL) 40 MG tablet Take 1 tablet (40 mg total) by mouth daily. 06/22/14   Colon Branch, MD    Physical Exam: Filed Vitals:   04/18/15 1636 04/18/15 1711 04/18/15 1905  BP: 113/73  108/60  Pulse: 100  94  Temp: 98.2 F (36.8 C) 100.1 F (37.8 C)   TempSrc: Oral Rectal   Resp: 25  26  SpO2: 95%  97%   General: Not in acute distress HEENT:       Eyes: PERRL, EOMI, no scleral icterus.       ENT: No discharge from the ears and nose, no pharynx injection, no tonsillar enlargement.        Neck: No JVD, no bruit, no mass felt. Heme: No neck lymph node enlargement. Cardiac: S1/S2, RRR, No murmurs, No gallops or rubs. Pulm: No rales, wheezing, rhonchi or rubs. Abd: Soft, nondistended, nontender, no rebound pain, no organomegaly, BS present. Ext: has 2+ pitting right leg edema. No left leg edema.  2+DP/PT pulse bilaterally. Musculoskeletal: No joint deformities, No joint redness or warmth, no limitation of ROM in spin. Skin: has 4 to 5 cm deep sacral wound, exposured muscle, foul smell, purulent yellow exudate. There is a small skin lesion over left medial heal without signs of infection. Neuro: Alert, oriented to place, knows his own name, cranial nerves II-XII grossly intact, left sided hemiparalysis .Psych: Patient is not psychotic.  Labs on Admission:  Basic Metabolic Panel:  Recent Labs Lab 04/18/15 1826  NA 143  K 4.3  CL 109  CO2 27  GLUCOSE 105*  BUN 22*  CREATININE 0.91   CALCIUM 8.4*   Liver Function Tests:  Recent Labs Lab 04/18/15 1826  AST 48*  ALT 48  ALKPHOS 67  BILITOT 0.1*  PROT 6.7  ALBUMIN 2.0*   No results for input(s): LIPASE, AMYLASE in the last 168 hours. No results for input(s): AMMONIA in the last 168 hours. CBC:  Recent Labs Lab 04/18/15 1826  WBC 17.9*  NEUTROABS 14.0*  HGB 11.8*  HCT 37.5*  MCV 96.2  PLT 210   Cardiac Enzymes: No results for input(s): CKTOTAL, CKMB, CKMBINDEX, TROPONINI in the last 168 hours.  BNP (last 3 results) No results for input(s): BNP in the last 8760 hours.  ProBNP (last 3 results) No results for input(s): PROBNP in the last 8760 hours.  CBG: No results for input(s): GLUCAP in the last 168 hours.  Radiological Exams on Admission: No results found.  Assessment/Plan Principal Problem:   Sacral Wound infection Active Problems:   TOBACCO ABUSE   Anxiety and depression   Essential hypertension   GERD   Hyperlipidemia   CAD (coronary artery disease)   Stroke due to occlusion of right middle cerebral artery (HCC)   History of ETOH abuse   Hemiparesis affecting left side as late effect of stroke (HCC)   Paroxysmal atrial fibrillation (H[facility])   Sepsis ([place])   Sacral Wound infection and sepsis: Patient has a deep sacral wound with infection. He is septic with leukocytosis, fever, tachycardia, tachypnea and elevated lactate 2.43. Hemodynamically stable. General surgeon, Dr. Marlou Starks was consulted by EDP. -will admit to tele bed -IV Vanco and Zosyn were started in ED, will continue  -f/u surgeon's recommendation - INR/PTT/type & screen -will get Procalcitonin and trend lactic acid levels per sepsis protocol. -IVF: 3L of NS bolus in ED, followed by 100 cc/h  -prn percocet for pain -consult to [facility] -f/u Bx  Asymmetric leg edema: Patient has unilateral right leg edema, likely due to positional fluid retention, but still needs to rule out DVT -Lower extremity venous Doppler  Atrial  Fibrillation: CHA2DS2-VASc Score is 5, needs oral anticoagulation. Patient is on Eliquis at home. Heart rate is well controlled. -  hold Eliquis due to the need of surgical debridement -continue metoprolol  HTN:  - hold amlodipine since patient is at risk for developing hypotension secondary to sepsis -Continue metoprolol -IV hydralazine when necessary  Tobacco abuse -Nicotine patch  HLD: Last LDL was 59 on 09/18/14 -Continue home medications: Pravastatin  GERD: -Protonix  CAD: s/p of CABG. No CP -continue metoprolol, Prevacid ending, appearing nitroglycerin  Hx of stroke: has left-sided hemiparesis. -Hold Plavix due to the need of surgical debridement -Continue pravastatin  DVT ppx: SCD (apply SCD only to left leg, need to r/o DVT in right leg)  Code Status: Full code Family Communication: None at bed side Disposition Plan: Admit to inpatient   Date of Service 04/18/2015    Ivor Costa [facility] Pager [phone]  If 7PM-7AM, please contact night-coverage [email or web address] Password [email or web address] 04/18/2015, 8:10 PM

## 2015-04-18 NOTE — ED Notes (Signed)
I ATTEMPTED TO COLLECT LABS AND WAS UNSUCCESSFUL. 

## 2015-04-18 NOTE — ED Notes (Signed)
EMS called to [facility].  Patient was being seen by his PCP today and the PCP wanted  Him to come in for surgical wound debridement of a sacral wound.  Currently patient denies  Any pain.

## 2015-04-18 NOTE — Consult Note (Signed)
Reason for Consult:sacral wound Referring Physician: Dr. Kern [name] is an 75 y.o. male.  HPI: The patient is a 75 yo wm who presents to the ER with a sacral wound. He is apparently a nursing home patient but he does not answer questions appropriately and there is no one with him to give a history. He has a low grade temp and wbc is 17.9. He has a sacral wound.  Past Medical History  Diagnosis Date  . Carotid arterial disease (HCC)     s/p RCEA 11/2008 (Note: hx of negative cardiac nuc 03/2009)  . Hypertension   . Colonic polyp     cscopes 08/18/05 tics,polyps.Marland Kitchenall hyperplastic  . GERD (gastroesophageal reflux disease)     Barrets Esop. (per bx 08/2005) and HH f/u Dr.Schooler  . Anal warts   . Anal fissure   . Hemorrhoids   . 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 Panic attack   . Osteoarthritis   . Depression   . H/O ETOH abuse     Former. Quit ~2012  . BPH (benign prostatic hyperplasia)     s/p surgery  . Anxiety and depression 02/14/2009    Qualifier: Diagnosis of  By: Drue Novel MD, Nolon Rod.     Past Surgical History  Procedure Laterality Date  . Right carotid endartectomy with dacron patch angioplasty  11/2008  . Appendectomy    . Knee surgery    . Foot surgery      fx  . Total hip arthroplasty  2005    R due to OA  . Skin tags excised    . Transurethral resection of prostate    . Cataract extraction      wears contact lens L, good vision  . Coronary artery bypass graft  03/23/2011    Procedure: CORONARY ARTERY BYPASS GRAFTING (CABG);  Surgeon: Loreli Slot, MD;  Location[facility] OR;  Service: Open Heart Surgery;  Laterality: N/A;  CABG x four using bilateral greater saphenous vein, harvested endoscopically  . Left heart catheterization with coronary angiogram N/A 03/19/2011    Procedure: LEFT HEART CATHETERIZATION WITH CORONARY ANGIOGRAM;  Surgeon: Vesta Mixer, MD;  Location: [facility] CATH LAB;  Service: Cardiovascular;  Laterality:  N/A;  . Ep implantable device N/A 01/01/2015    Procedure: Loop Recorder Insertion;  Surgeon: Hillis Range, MD;  Location: [facility] INVASIVE CV LAB;  Service: Cardiovascular;  Laterality: N/A;  . Tee without cardioversion N/A 01/01/2015    Procedure: TRANSESOPHAGEAL ECHOCARDIOGRAM (TEE) WITH A LOOP;  Surgeon: Chrystie Nose, MD;  Location: [facility] ENDOSCOPY;  Service: Cardiovascular;  Laterality: N/A;    Family History  Problem Relation Age of Onset  . Lung cancer Father     mets to brain- deceased  . Arthritis Mother   . Hypertension Mother   . Coronary artery disease Neg Hx   . Colon cancer Neg Hx   . Prostate cancer Neg Hx   . Diabetes Paternal Aunt   . Stroke Paternal Grandfather     Social History:  reports that he has been smoking Cigarettes.  He has a 52 pack-year smoking history. He has never used smokeless tobacco. He reports that he does not drink alcohol or use illicit drugs.  Allergies:  Allergies  Allergen Reactions  . Sertraline Hcl     REACTION: heart palpitation     Medications: I have reviewed the patient's current medications.  Results for orders placed  or performed during the hospital encounter of 04/18/15 (from the past 48 hour(s))  Urinalysis, Routine w reflex microscopic (not at [facility])     Status: Abnormal   Collection Time: 04/18/15  6:22 PM  Result Value Ref Range   Color, Urine YELLOW YELLOW   APPearance HAZY (A) CLEAR   Specific Gravity, Urine 1.028 1.005 - 1.030   pH 7.0 5.0 - 8.0   Glucose, UA NEGATIVE NEGATIVE mg/dL   Hgb urine dipstick NEGATIVE NEGATIVE   Bilirubin Urine NEGATIVE NEGATIVE   Ketones, ur NEGATIVE NEGATIVE mg/dL   Protein, ur NEGATIVE NEGATIVE mg/dL   Nitrite NEGATIVE NEGATIVE   Leukocytes, UA NEGATIVE NEGATIVE    Comment: MICROSCOPIC NOT DONE ON URINES WITH NEGATIVE PROTEIN, BLOOD, LEUKOCYTES, NITRITE, OR GLUCOSE <1000 mg/dL.  CBC with Differential     Status: Abnormal   Collection Time: 04/18/15  6:26 PM  Result Value Ref Range    WBC 17.9 (H) 4.0 - 10.5 K/uL   RBC 3.90 (L) 4.22 - 5.81 MIL/uL   Hemoglobin 11.8 (L) 13.0 - 17.0 g/dL   HCT 37.5 (L) 39.0 - 52.0 %   MCV 96.2 78.0 - 100.0 fL   MCH 30.3 26.0 - 34.0 pg   MCHC 31.5 30.0 - 36.0 g/dL   RDW 15.6 (H) 11.5 - 15.5 %   Platelets 210 150 - 400 K/uL   Neutrophils Relative % 78 %   Lymphocytes Relative 13 %   Monocytes Relative 8 %   Eosinophils Relative 1 %   Basophils Relative 0 %   Neutro Abs 14.0 (H) 1.7 - 7.7 K/uL   Lymphs Abs 2.3 0.7 - 4.0 K/uL   Monocytes Absolute 1.4 (H) 0.1 - 1.0 K/uL   Eosinophils Absolute 0.2 0.0 - 0.7 K/uL   Basophils Absolute 0.0 0.0 - 0.1 K/uL   Smear Review MORPHOLOGY UNREMARKABLE   Comprehensive metabolic panel     Status: Abnormal   Collection Time: 04/18/15  6:26 PM  Result Value Ref Range   Sodium 143 135 - 145 mmol/L   Potassium 4.3 3.5 - 5.1 mmol/L   Chloride 109 101 - 111 mmol/L   CO2 27 22 - 32 mmol/L   Glucose, Bld 105 (H) 65 - 99 mg/dL   BUN 22 (H) 6 - 20 mg/dL   Creatinine, Ser 0.91 0.61 - 1.24 mg/dL   Calcium 8.4 (L) 8.9 - 10.3 mg/dL   Total Protein 6.7 6.5 - 8.1 g/dL   Albumin 2.0 (L) 3.5 - 5.0 g/dL   AST 48 (H) 15 - 41 U/L   ALT 48 17 - 63 U/L   Alkaline Phosphatase 67 38 - 126 U/L   Total Bilirubin 0.1 (L) 0.3 - 1.2 mg/dL   GFR calc non Af Amer >60 >60 mL/min   GFR calc Af Amer >60 >60 mL/min    Comment: (NOTE) The eGFR has been calculated using the CKD EPI equation. This calculation has not been validated in all clinical situations. eGFR's persistently <60 mL/min signify possible Chronic Kidney Disease.    Anion gap 7 5 - 15  I-Stat CG4 Lactic Acid, ED     Status: Abnormal   Collection Time: 04/18/15  6:47 PM  Result Value Ref Range   Lactic Acid, Venous 2.43 (HH) 0.5 - 2.0 mmol/L   Comment NOTIFIED PHYSICIAN     No results found.  Review of Systems  Constitutional: Positive for fever.  HENT: Negative.   Eyes: Negative.   Respiratory: Negative.   Cardiovascular: Negative.  Gastrointestinal: Negative.   Genitourinary: Negative.   Musculoskeletal: Negative.   Skin: Negative.   Neurological: Positive for weakness.  Endo/Heme/Allergies: Bruises/bleeds easily.  Psychiatric/Behavioral: Positive for memory loss.   Blood pressure 108/60, pulse 94, temperature 100.1 F (37.8 C), temperature source Rectal, resp. rate 26, weight 90.719 kg (200 lb), SpO2 97 %. Physical Exam  Constitutional: He appears well-developed and well-nourished.  HENT:  Head: Normocephalic and atraumatic.  Eyes: Conjunctivae and EOM are normal. Pupils are equal, round, and reactive to light.  Neck: Normal range of motion. Neck supple.  Cardiovascular: Normal rate, regular rhythm and normal heart sounds.   Respiratory: Effort normal and breath sounds normal.  GI: Soft. There is no tenderness.  Musculoskeletal:  Seems to have weakness or paralysis of lower extr. Sacral wound wide open with some necrotic edges  Neurological:  Alert but confused. Not oriented to time or place. Poor historian  Skin: Skin is warm and dry.  Psychiatric:  disorented    Assessment/Plan: The patient has a large sacral wound that is wide open and draining. There does not appear to be any undrained pockets when probed. There is some necrotic eschar around the edges. I would recommend holding his blood thinners. We may be able to debride the wound at bedside. Would recommend consulting PT for hydrotherapy and wound care nurses. Will follow  TOTH [name],Nicolas Stavros S 04/18/2015, 8:27 PM

## 2015-04-19 ENCOUNTER — Inpatient Hospital Stay (HOSPITAL_COMMUNITY): Payer: PPO

## 2015-04-19 DIAGNOSIS — R600 Localized edema: Secondary | ICD-10-CM

## 2015-04-19 DIAGNOSIS — F17200 Nicotine dependence, unspecified, uncomplicated: Secondary | ICD-10-CM

## 2015-04-19 DIAGNOSIS — L8990 Pressure ulcer of unspecified site, unspecified stage: Secondary | ICD-10-CM

## 2015-04-19 DIAGNOSIS — E785 Hyperlipidemia, unspecified: Secondary | ICD-10-CM

## 2015-04-19 DIAGNOSIS — F1010 Alcohol abuse, uncomplicated: Secondary | ICD-10-CM

## 2015-04-19 DIAGNOSIS — I2510 Atherosclerotic heart disease of native coronary artery without angina pectoris: Secondary | ICD-10-CM

## 2015-04-19 DIAGNOSIS — K219 Gastro-esophageal reflux disease without esophagitis: Secondary | ICD-10-CM

## 2015-04-19 LAB — BASIC METABOLIC PANEL
ANION GAP: 4 — AB (ref 5–15)
BUN: 18 mg/dL (ref 6–20)
CHLORIDE: 115 mmol/L — AB (ref 101–111)
CO2: 27 mmol/L (ref 22–32)
CREATININE: 0.85 mg/dL (ref 0.61–1.24)
Calcium: 7.8 mg/dL — ABNORMAL LOW (ref 8.9–10.3)
GFR calc non Af Amer: >60 mL/min (ref >60)
Glucose, Bld: 87 mg/dL (ref 65–99)
Potassium: 4.5 mmol/L (ref 3.5–5.1)
Sodium: 146 mmol/L — ABNORMAL HIGH (ref 135–145)

## 2015-04-19 LAB — CBC
HCT: 29.8 % — ABNORMAL LOW (ref 39.0–52.0)
HEMOGLOBIN: 9.5 g/dL — AB (ref 13.0–17.0)
MCH: 30.6 pg (ref 26.0–34.0)
MCHC: 31.9 g/dL (ref 30.0–36.0)
MCV: 96.1 fL (ref 78.0–100.0)
Platelets: 203 10*3/uL (ref 150–400)
RBC: 3.1 MIL/uL — AB (ref 4.22–5.81)
RDW: 15.7 % — ABNORMAL HIGH (ref 11.5–15.5)
WBC: 16.3 10*3/uL — ABNORMAL HIGH (ref 4.0–10.5)

## 2015-04-19 LAB — GLUCOSE, CAPILLARY: Glucose-Capillary: 66 mg/dL (ref 65–99)

## 2015-04-19 MED ORDER — MORPHINE SULFATE (PF) 4 MG/ML IV SOLN
4.0000 mg | INTRAVENOUS | Status: DC | PRN
  Administered 2015-04-19 – 2015-04-22 (×8): 4 mg via INTRAVENOUS
  Filled 2015-04-19 (×8): qty 1

## 2015-04-19 MED ORDER — ADULT MULTIVITAMIN W/MINERALS CH
1.0000 | ORAL_TABLET | Freq: Every day | ORAL | Status: DC
  Administered 2015-04-20 – 2015-04-23 (×4): 1 via ORAL
  Filled 2015-04-19 (×4): qty 1

## 2015-04-19 MED ORDER — CETYLPYRIDINIUM CHLORIDE 0.05 % MT LIQD
7.0000 mL | Freq: Two times a day (BID) | OROMUCOSAL | Status: DC
  Administered 2015-04-19 – 2015-04-22 (×5): 7 mL via OROMUCOSAL

## 2015-04-19 MED ORDER — LORAZEPAM 1 MG PO TABS
1.0000 mg | ORAL_TABLET | Freq: Three times a day (TID) | ORAL | Status: DC | PRN
  Administered 2015-04-22: 1 mg via ORAL
  Filled 2015-04-19: qty 1

## 2015-04-19 MED ORDER — LIDOCAINE HCL (PF) 2 % IJ SOLN
0.0000 mL | Freq: Once | INTRAMUSCULAR | Status: DC | PRN
  Filled 2015-04-19 (×2): qty 20

## 2015-04-19 MED ORDER — PRO-STAT SUGAR FREE PO LIQD
30.0000 mL | Freq: Three times a day (TID) | ORAL | Status: DC
Start: 2015-04-19 — End: 2015-04-23
  Administered 2015-04-19 – 2015-04-23 (×11): 30 mL via ORAL
  Filled 2015-04-19 (×11): qty 30

## 2015-04-19 MED ORDER — CHLORHEXIDINE GLUCONATE 0.12 % MT SOLN
15.0000 mL | Freq: Two times a day (BID) | OROMUCOSAL | Status: DC
  Administered 2015-04-19 – 2015-04-23 (×7): 15 mL via OROMUCOSAL
  Filled 2015-04-19 (×7): qty 15

## 2015-04-19 NOTE — NC FL2 (Signed)
[place] LEVEL OF CARE SCREENING TOOL     IDENTIFICATION  Patient Name: [name] Birthdate: 20-Feb-1940 Sex: male Admission Date (Current Location): 04/18/2015  [facility] and [place] Number:  Herbalist and Address:  [facility],  [place] [address] [place]      Provider Number: [id number]  Attending Physician Name and Address:  Venetia Maxon Rama, MD  Relative Name and Phone Number:       Current Level of Care: Hospital Recommended Level of Care: [place] Prior Approval Number:    Date Approved/Denied:   PASRR Number: [id number]  Discharge Plan: SNF    Current Diagnoses: Patient Active Problem List   Diagnosis Date Noted  . Pressure ulcer 04/19/2015  . Sepsis ([place]) 04/18/2015  . Sacral Wound infection 04/18/2015  . Leg edema, right   . Sacral wound   . Paroxysmal atrial fibrillation ([place]) 03/18/2015  . Stroke due to occlusion of right middle cerebral artery ([place])   . Coronary artery disease involving native coronary artery of native heart without angina pectoris   . History of ETOH abuse   . Tachypnea   . Adjustment disorder with mixed anxiety and depressed mood   . BPH (benign prostatic hyperplasia)   . Hemiparesis affecting dominant side as late effect of stroke ([place])   . Hemiparesis affecting left side as late effect of stroke ([place])   . Hemiparesis and alteration of sensations as late effects of stroke ([place])   . Stroke with cerebral ischemia ([place]) 12/29/2014  . Follow-up ----------- PCP NOTES 09/18/2014  . Hiccups 02/20/2013  . Hyperglycemia 08/19/2012  . BCC (basal cell carcinoma of skin) 10/16/2011  . CAD (coronary artery disease) 05/13/2011  . Hyperlipidemia 05/26/2010  . Annual physical exam 05/26/2010  . GAIT DISTURBANCE 11/25/2009  . Anxiety and depression 02/14/2009  . FATIGUE 02/14/2009  . ALCOHOL ABUSE 12/03/2008  . CAROTID ARTERY DISEASE 12/03/2008  . Osteoarthritis 02/21/2008   . ERECTILE DYSFUNCTION 05/18/2007  . TOBACCO ABUSE 05/18/2007  . BARRETTS ESOPHAGUS 02/21/2007  . BENIGN PROSTATIC HYPERTROPHY, HX OF 02/21/2007  . Essential hypertension 04/19/2006  . GERD 04/19/2006  . COLONIC POLYPS, BENIGN, HX OF 04/19/2006    Orientation RESPIRATION BLADDER Height & Weight     Self  Normal Incontinent, External catheter Weight: 184 lb 3.2 oz (83.553 kg) Height:     BEHAVIORAL SYMPTOMS/MOOD NEUROLOGICAL BOWEL NUTRITION STATUS      Continent Diet (Heart Healthy)  AMBULATORY STATUS COMMUNICATION OF NEEDS Skin   Limited Assist Verbally PU Stage and Appropriate Care (PressureUlcer04/07/17Unstageable-Fullthicknesstissuelossinwhichthebaseoftheulceriscoveredbyslough(yellow,tan,gray,greenorbrown)and/oreschar(tan,brownorblack)inthewoundbed. PressureUlcer04/07/17StageII-Partialthi)                       Personal Care Assistance Level of Assistance  Dressing, Bathing Bathing Assistance: Limited assistance   Dressing Assistance: Limited assistance     Functional Limitations Info             SPECIAL CARE FACTORS FREQUENCY  PT (By licensed PT), OT (By licensed OT)     PT Frequency: 5 OT Frequency: 5            Contractures      Additional Factors Info  Code Status, Allergies Code Status Info: Fullcode Allergies Info: Allergies:  Sertraline Hcl           Current Medications (04/19/2015):  This is the current hospital active medication list Current Facility-Administered Medications  Medication Dose Route Frequency Provider Last Rate Last Dose  . 0.9 %  sodium chloride infusion   Intravenous Continuous Ivor Costa, MD 100 mL/hr at 04/19/15 1036 100 mL/hr at 04/19/15 1036  . acetaminophen (TYLENOL) tablet 650 mg  650 mg Oral Q6H PRN Ivor Costa, MD       Or  . acetaminophen (TYLENOL) suppository 650 mg  650 mg Rectal Q6H PRN Ivor Costa, MD      . antiseptic oral rinse (CPC / CETYLPYRIDINIUM CHLORIDE 0.05%)  solution 7 mL  7 mL Mouth Rinse q12n4p Ivor Costa, MD      . chlorhexidine (PERIDEX) 0.12 % solution 15 mL  15 mL Mouth Rinse BID Ivor Costa, MD   15 mL at 04/19/15 1022  . cholecalciferol (VITAMIN D) tablet 2,000 Units  2,000 Units Oral Daily Ivor Costa, MD   2,000 Units at 04/19/15 1023  . divalproex (DEPAKOTE SPRINKLE) capsule 125 mg  125 mg Oral BID Ivor Costa, MD   125 mg at 04/19/15 1024  . hydrALAZINE (APRESOLINE) injection 5 mg  5 mg Intravenous Q2H PRN Ivor Costa, MD      . lidocaine (XYLOCAINE) 2 % injection 0-20 mL  0-20 mL Intradermal Once PRN Jackolyn Confer, MD      . LORazepam (ATIVAN) tablet 0.5 mg  0.5 mg Oral BID PRN Ivor Costa, MD      . metoprolol tartrate (LOPRESSOR) tablet 50 mg  50 mg Oral BID Ivor Costa, MD   50 mg at 04/19/15 1034  . nicotine (NICODERM CQ - dosed in mg/24 hours) patch 21 mg  21 mg Transdermal Daily Ivor Costa, MD   21 mg at 04/19/15 1032  . nitroGLYCERIN (NITROSTAT) SL tablet 0.4 mg  0.4 mg Sublingual Q5 min PRN Ivor Costa, MD      . oxyCODONE-acetaminophen (PERCOCET/ROXICET) 5-325 MG per tablet 1 tablet  1 tablet Oral Q4H PRN Ivor Costa, MD   1 tablet at 04/19/15 0150  . pantoprazole (PROTONIX) EC tablet 40 mg  40 mg Oral Daily Ivor Costa, MD   40 mg at 04/19/15 1022  . piperacillin-tazobactam (ZOSYN) IVPB 3.375 g  3.375 g Intravenous Q8H Terri L Green, RPH   3.375 g at 04/19/15 1004  . pravastatin (PRAVACHOL) tablet 40 mg  40 mg Oral Daily Ivor Costa, MD   40 mg at 04/19/15 1024  . sodium chloride flush (NS) 0.9 % injection 3 mL  3 mL Intravenous Q12H Ivor Costa, MD   3 mL at 04/18/15 2200  . traMADol (ULTRAM) tablet 50 mg  50 mg Oral Q6H PRN Ivor Costa, MD      . vancomycin (VANCOCIN) 1,250 mg in sodium chloride 0.9 % 250 mL IVPB  1,250 mg Intravenous Q12H Minda Ditto, RPH   1,250 mg at 04/19/15 0607  . vitamin E capsule 400 Units  400 Units Oral Daily Ivor Costa, MD   400 Units at 04/19/15 1024     Discharge Medications: Please see discharge summary for a list  of discharge medications.  Relevant Imaging Results:  Relevant Lab Results:   Additional Information SSN [id number]  Standley Brooking, LCSW

## 2015-04-19 NOTE — NC FL2 (Signed)
[place] LEVEL OF CARE SCREENING TOOL     IDENTIFICATION  Patient Name: [name] Birthdate: 21-Feb-1940 Sex: male Admission Date (Current Location): 04/18/2015  [facility] and [place] Number:  Herbalist and Address:  [facility],  [place] [address], [place]      Provider Number: [id number]  Attending Physician Name and Address:  Venetia Maxon Rama, MD  Relative Name and Phone Number:       Current Level of Care: Hospital Recommended Level of Care: [place] Prior Approval Number:    Date Approved/Denied:   PASRR Number: [id number]  Discharge Plan: SNF    Current Diagnoses: Patient Active Problem List   Diagnosis Date Noted  . Pressure ulcer 04/19/2015  . Sepsis ([place]) 04/18/2015  . Sacral Wound infection 04/18/2015  . Leg edema, right   . Sacral wound   . Paroxysmal atrial fibrillation ([place]) 03/18/2015  . Stroke due to occlusion of right middle cerebral artery ([place])   . Coronary artery disease involving native coronary artery of native heart without angina pectoris   . History of ETOH abuse   . Tachypnea   . Adjustment disorder with mixed anxiety and depressed mood   . BPH (benign prostatic hyperplasia)   . Hemiparesis affecting dominant side as late effect of stroke ([place])   . Hemiparesis affecting left side as late effect of stroke ([place])   . Hemiparesis and alteration of sensations as late effects of stroke ([place])   . Stroke with cerebral ischemia ([place]) 12/29/2014  . Follow-up ----------- PCP NOTES 09/18/2014  . Hiccups 02/20/2013  . Hyperglycemia 08/19/2012  . BCC (basal cell carcinoma of skin) 10/16/2011  . CAD (coronary artery disease) 05/13/2011  . Hyperlipidemia 05/26/2010  . Annual physical exam 05/26/2010  . GAIT DISTURBANCE 11/25/2009  . Anxiety and depression 02/14/2009  . FATIGUE 02/14/2009  . ALCOHOL ABUSE 12/03/2008  . CAROTID ARTERY DISEASE 12/03/2008  . Osteoarthritis 02/21/2008   . ERECTILE DYSFUNCTION 05/18/2007  . TOBACCO ABUSE 05/18/2007  . BARRETTS ESOPHAGUS 02/21/2007  . BENIGN PROSTATIC HYPERTROPHY, HX OF 02/21/2007  . Essential hypertension 04/19/2006  . GERD 04/19/2006  . COLONIC POLYPS, BENIGN, HX OF 04/19/2006    Orientation RESPIRATION BLADDER Height & Weight     Self  Normal Incontinent, External catheter Weight: 184 lb 3.2 oz (83.553 kg) Height:     BEHAVIORAL SYMPTOMS/MOOD NEUROLOGICAL BOWEL NUTRITION STATUS      Continent Diet (Heart Healthy)  AMBULATORY STATUS COMMUNICATION OF NEEDS Skin   Limited Assist Verbally PressureUlcer04/07/17Unstageable-Fullthicknesstissuelossinwhichthebaseoftheulceriscoveredbyslough(yellow,tan,gray,greenorbrown)and/oreschar(tan,brownorblack)inthewoundbed. PressureUlcer04/07/17StageII-Partialthicknesslossofdermispresentingasashallowopenulcerwithared,pinkwoundbedwithoutslough. PressureUlcer04/07/17StageI-Intactskinwithnon-blanchablerednessofalocalizedareausuallyoverabonyprominence.               Personal Care Assistance Level of Assistance  Dressing, Bathing Bathing Assistance: Limited assistance   Dressing Assistance: Limited assistance     Functional Limitations Info             SPECIAL CARE FACTORS FREQUENCY  PT (By licensed PT), OT (By licensed OT)     PT Frequency: 5 OT Frequency: 5            Contractures      Additional Factors Info  Code Status, Allergies Code Status Info: Fullcode Allergies Info: Allergies:  Sertraline Hcl           Current Medications (04/19/2015):  This is the current hospital active medication list Current Facility-Administered Medications  Medication Dose Route Frequency Provider Last Rate Last Dose  . 0.9 %  sodium chloride infusion   Intravenous Continuous Ivor Costa, MD 100 mL/hr  at 04/19/15 1036 100 mL/hr at 04/19/15 1036  . acetaminophen (TYLENOL) tablet 650 mg  650 mg Oral Q6H  PRN Ivor Costa, MD       Or  . acetaminophen (TYLENOL) suppository 650 mg  650 mg Rectal Q6H PRN Ivor Costa, MD      . antiseptic oral rinse (CPC / CETYLPYRIDINIUM CHLORIDE 0.05%) solution 7 mL  7 mL Mouth Rinse q12n4p Ivor Costa, MD      . chlorhexidine (PERIDEX) 0.12 % solution 15 mL  15 mL Mouth Rinse BID Ivor Costa, MD   15 mL at 04/19/15 1022  . cholecalciferol (VITAMIN D) tablet 2,000 Units  2,000 Units Oral Daily Ivor Costa, MD   2,000 Units at 04/19/15 1023  . divalproex (DEPAKOTE SPRINKLE) capsule 125 mg  125 mg Oral BID Ivor Costa, MD   125 mg at 04/19/15 1024  . hydrALAZINE (APRESOLINE) injection 5 mg  5 mg Intravenous Q2H PRN Ivor Costa, MD      . lidocaine (XYLOCAINE) 2 % injection 0-20 mL  0-20 mL Intradermal Once PRN Jackolyn Confer, MD      . LORazepam (ATIVAN) tablet 0.5 mg  0.5 mg Oral BID PRN Ivor Costa, MD      . metoprolol tartrate (LOPRESSOR) tablet 50 mg  50 mg Oral BID Ivor Costa, MD   50 mg at 04/19/15 1034  . nicotine (NICODERM CQ - dosed in mg/24 hours) patch 21 mg  21 mg Transdermal Daily Ivor Costa, MD   21 mg at 04/19/15 1032  . nitroGLYCERIN (NITROSTAT) SL tablet 0.4 mg  0.4 mg Sublingual Q5 min PRN Ivor Costa, MD      . oxyCODONE-acetaminophen (PERCOCET/ROXICET) 5-325 MG per tablet 1 tablet  1 tablet Oral Q4H PRN Ivor Costa, MD   1 tablet at 04/19/15 1127  . pantoprazole (PROTONIX) EC tablet 40 mg  40 mg Oral Daily Ivor Costa, MD   40 mg at 04/19/15 1022  . piperacillin-tazobactam (ZOSYN) IVPB 3.375 g  3.375 g Intravenous Q8H Terri L Green, RPH   3.375 g at 04/19/15 1004  . pravastatin (PRAVACHOL) tablet 40 mg  40 mg Oral Daily Ivor Costa, MD   40 mg at 04/19/15 1024  . sodium chloride flush (NS) 0.9 % injection 3 mL  3 mL Intravenous Q12H Ivor Costa, MD   3 mL at 04/18/15 2200  . traMADol (ULTRAM) tablet 50 mg  50 mg Oral Q6H PRN Ivor Costa, MD      . vancomycin (VANCOCIN) 1,250 mg in sodium chloride 0.9 % 250 mL IVPB  1,250 mg Intravenous Q12H Minda Ditto, RPH   1,250 mg at  04/19/15 0607  . vitamin E capsule 400 Units  400 Units Oral Daily Ivor Costa, MD   400 Units at 04/19/15 1024     Discharge Medications: Please see discharge summary for a list of discharge medications.  Relevant Imaging Results:  Relevant Lab Results:   Additional Information SSN [id number]  Standley Brooking, LCSW

## 2015-04-19 NOTE — Progress Notes (Signed)
OT Cancellation Note  Patient Details Name: [name] MRN: [id number] DOB: [birth year]   Cancelled Treatment:    Reason Eval/Treat Not Completed: Other (comment) -- Noted patient from SNF with plans to return to SNF at discharge. Will defer all OT needs to OT at SNF. Thank you.  Cristol Engdahl A 04/19/2015, 7:12 AM

## 2015-04-19 NOTE — Progress Notes (Signed)
Subjective: Opens eyes but does not respond to questions.  Objective: Vital signs in last 24 hours: Temp:  [98 F (36.7 C)-100.1 F (37.8 C)] 98.8 F (37.1 C) (04/07 0605) Pulse Rate:  [78-116] 78 (04/07 0605) Resp:  [18-26] 18 (04/07 0605) BP: (108-122)/(60-73) 109/64 mmHg (04/07 0605) SpO2:  [95 %-98 %] 98 % (04/07 0605) Weight:  [83.553 kg (184 lb 3.2 oz)-90.719 kg (200 lb)] 83.553 kg (184 lb 3.2 oz) (04/06 2210)    Intake/Output from previous day: 04/06 0701 - 04/07 0700 In: 966.7 [I.V.:666.7; IV Piggyback:300] Out: -  Intake/Output this shift:    PE: Sacrum-stage 3 decubitus ulcer with at 10 % necrotic tissue; no purulent drainage. Lab Results:   Recent Labs  04/18/15 1826 04/19/15 0415  WBC 17.9* 16.3*  HGB 11.8* 9.5*  HCT 37.5* 29.8*  PLT 210 203   BMET  Recent Labs  04/18/15 1826 04/19/15 0415  NA 143 146*  K 4.3 4.5  CL 109 115*  CO2 27 27  GLUCOSE 105* 87  BUN 22* 18  CREATININE 0.91 0.85  CALCIUM 8.4* 7.8*   PT/INR  Recent Labs  04/18/15 2136  LABPROT 18.0*  INR 1.55*   Comprehensive Metabolic Panel:    Component Value Date/Time   NA 146* 04/19/2015 0415   NA 143 04/18/2015 1826   K 4.5 04/19/2015 0415   K 4.3 04/18/2015 1826   CL 115* 04/19/2015 0415   CL 109 04/18/2015 1826   CO2 27 04/19/2015 0415   CO2 27 04/18/2015 1826   BUN 18 04/19/2015 0415   BUN 22* 04/18/2015 1826   CREATININE 0.85 04/19/2015 0415   CREATININE 0.91 04/18/2015 1826   GLUCOSE 87 04/19/2015 0415   GLUCOSE 105* 04/18/2015 1826   GLUCOSE 106* 01/08/2006 1103   CALCIUM 7.8* 04/19/2015 0415   CALCIUM 8.4* 04/18/2015 1826   AST 48* 04/18/2015 1826   AST 21 12/29/2014 1238   ALT 48 04/18/2015 1826   ALT 16* 12/29/2014 1238   ALKPHOS 67 04/18/2015 1826   ALKPHOS 89 12/29/2014 1238   BILITOT 0.1* 04/18/2015 1826   BILITOT 0.5 12/29/2014 1238   PROT 6.7 04/18/2015 1826   PROT 6.8 12/29/2014 1238   ALBUMIN 2.0* 04/18/2015 1826   ALBUMIN 3.5  12/29/2014 1238     Studies/Results: No results found.  Anti-infectives: Anti-infectives    Start     Dose/Rate Route Frequency Ordered Stop   04/19/15 0600  vancomycin (VANCOCIN) 1,250 mg in sodium chloride 0.9 % 250 mL IVPB     1,250 mg 166.7 mL/hr over 90 Minutes Intravenous Every 12 hours 04/18/15 2021     04/19/15 0200  piperacillin-tazobactam (ZOSYN) IVPB 3.375 g     3.375 g 12.5 mL/hr over 240 Minutes Intravenous Every 8 hours 04/18/15 2022     04/18/15 1800  vancomycin (VANCOCIN) IVPB 1000 mg/200 mL premix     1,000 mg 200 mL/hr over 60 Minutes Intravenous  Once 04/18/15 1723 04/18/15 2147   04/18/15 1730  piperacillin-tazobactam (ZOSYN) IVPB 3.375 g     3.375 g 100 mL/hr over 30 Minutes Intravenous  Once 04/18/15 1721 04/18/15 1910      Assessment Principal Problem:   Sacral Decubitus ulcer with 10% necrotic tissue Active Problems:   TOBACCO ABUSE   Anxiety and depression   Essential hypertension   GERD   Hyperlipidemia   CAD (coronary artery disease)   Stroke due to occlusion of right middle cerebral artery (HCC)   History of ETOH abuse  Hemiparesis affecting left side as late effect of stroke (HCC)   Paroxysmal atrial fibrillation ([place])   Sepsis ([place])    LOS: 1 day   Plan: Start hydrotherapy.  Bedside sharp debridement after off Eliquis and Plavix for 48 hours.   Linlee Cromie J 04/19/2015

## 2015-04-19 NOTE — Progress Notes (Signed)
Progress Note   [name] [id number] DOB: November 10, 1940 DOA: 04/18/2015 PCP: Kathlene November, MD   Brief Narrative:   [name] is an 75 y.o. male with a PMH of stroke with left-sided hemiparalysis, CAD status post CABG, hypertension, hyperlipidemia, alcohol abuse, PAF on Eliquis who was admitted 04/18/15 with sacral wound infection. In the ED, the patient was found to have a WBC of 17.9 and a lactate of 2.43. He was febrile, tachycardic, and tachypneic.  Assessment/Plan:   Principal Problem:   Sacral Wound infection/Sacral decubitus ulcer/sepsis Sepsis criteria met on admission and sepsis order set utilized. Status post fluid volume resuscitation per weight based algorithm. Patient was evaluated by general surgery. Bedside sharp debridement planned after the patient has been off lead thinners and Plavix for 48 hours. Hydrotherapy ordered. Continue empiric antibiotics. Pre-medicate with morphine prior to hydrotherapy sessions.  Active Problems:   Asymmetric leg edema Follow-up Dopplers to rule out DVT.    TOBACCO ABUSE  Nicotine patch provided.   Anxiety and depression Continue Depakote and Ativan as needed.    Essential hypertension Continue metoprolol. Norvasc currently on hold.    GERD Continue Protonix.    Hyperlipidemia Continue pravastatin.    CAD (coronary artery disease) Continue metoprolol, nitroglycerin as needed. Resume Plavix post debridement.    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 Continue pravastatin.    History of ETOH abuse    Paroxysmal atrial fibrillation (HCC) CHA2DS2-VASc Score is 5, needs oral anticoagulation. Patient is on Eliquis at home. Heart rate is well controlled. Hold Eliquis due to the need of surgical debridement. Continue metoprolol.    DVT Prophylaxis SCDs left leg until DVT ruled out right leg.   Family  Communication/Anticipated D/C date and plan/Code Status   Family Communication: Wife updated at bedside. Disposition Plan/date: From [facility] SNF. Will return there in a few days. Code Status: Full code.   Procedures and diagnostic studies:   No results found.  Medical Consultants:    None.  Anti-Infectives:   Vancomycin 04/18/15---> Zosyn 04/18/15--->   Subjective:   [name] is irritable and confused.  Says: "I hurt, I hurt, I hurt, I hurt".  Has back pain.  When asked about his symptoms, says, "This has been no picnic, I've been at this motel..." Seems to have inadequate pain control.    Objective:    Filed Vitals:   04/18/15 2100 04/18/15 2210 04/19/15 0150 04/19/15 0605  BP: 122/62 118/69 113/71 109/64  Pulse: 99 101 116 78  Temp: 100 F (37.8 C) 98 F (36.7 C)  98.8 F (37.1 C)  TempSrc: Oral Oral  Oral  Resp: '22 20  18  '$ Weight:  83.553 kg (184 lb 3.2 oz)    SpO2: 96% 98% 97% 98%    Intake/Output Summary (Last 24 hours) at 04/19/15 0900 Last data filed at 04/19/15 7416  Gross per 24 hour  Intake 966.67 ml  Output      0 ml  Net 966.67 ml   Filed Weights   04/18/15 2000 04/18/15 2210  Weight: 90.719 kg (200 lb) 83.553 kg (184 lb 3.2 oz)    Exam: Gen:  Irritable Cardiovascular:  RRR, No M/R/G Respiratory:  Lungs diminished at the bases. Gastrointestinal:  Abdomen soft, NT/ND, + BS Extremities:  2-3+ edema   Data Reviewed:    Labs: Basic Metabolic Panel:  Recent Labs Lab  04/18/15 1826 04/19/15 0415  NA 143 146*  K 4.3 4.5  CL 109 115*  CO2 27 27  GLUCOSE 105* 87  BUN 22* 18  CREATININE 0.91 0.85  CALCIUM 8.4* 7.8*   GFR Estimated Creatinine Clearance: 83.7 mL/min (by C-G formula based on Cr of 0.85). Liver Function Tests:  Recent Labs Lab 04/18/15 1826  AST 48*  ALT 48  ALKPHOS 67  BILITOT 0.1*  PROT 6.7  ALBUMIN 2.0*   Coagulation profile  Recent Labs Lab 04/18/15 2136  INR 1.55*    CBC:  Recent  Labs Lab 04/18/15 1826 04/19/15 0415  WBC 17.9* 16.3*  NEUTROABS 14.0*  --   HGB 11.8* 9.5*  HCT 37.5* 29.8*  MCV 96.2 96.1  PLT 210 203   CBG:  Recent Labs Lab 04/19/15 0716  GLUCAP 66   Sepsis Labs:  Recent Labs Lab 04/18/15 1826 04/18/15 1847 04/18/15 2136 04/19/15 0415  PROCALCITON  --   --  <0.10  --   WBC 17.9*  --   --  16.3*  LATICACIDVEN  --  2.43*  --   --    Microbiology Recent Results (from the past 240 hour(s))  Urine culture     Status: None (Preliminary result)   Collection Time: 04/18/15  6:22 PM  Result Value Ref Range Status   Specimen Description URINE, CATHETERIZED  Final   Special Requests NONE  Final   Culture   Final    NO GROWTH < 12 HOURS Performed at [facility]    Report Status PENDING  Incomplete  MRSA PCR Screening     Status: None   Collection Time: 04/18/15 10:25 PM  Result Value Ref Range Status   MRSA by PCR NEGATIVE NEGATIVE Final    Comment:        The GeneXpert MRSA Assay (FDA approved for NASAL specimens only), is one component of a comprehensive MRSA colonization surveillance program. It is not intended to diagnose MRSA infection nor to guide or monitor treatment for MRSA infections.      Medications:   . antiseptic oral rinse  7 mL Mouth Rinse q12n4p  . chlorhexidine  15 mL Mouth Rinse BID  . cholecalciferol  2,000 Units Oral Daily  . divalproex  125 mg Oral BID  . metoprolol  50 mg Oral BID  . nicotine  21 mg Transdermal Daily  . pantoprazole  40 mg Oral Daily  . piperacillin-tazobactam (ZOSYN)  IV  3.375 g Intravenous Q8H  . pravastatin  40 mg Oral Daily  . sodium chloride flush  3 mL Intravenous Q12H  . vancomycin  1,250 mg Intravenous Q12H  . vitamin E  400 Units Oral Daily   Continuous Infusions: . sodium chloride 100 mL/hr at 04/18/15 2351    Time spent: 35 minutes with > 50% of time discussing current diagnostic test results, clinical impression and plan of care.   LOS: 1 day    RAMA,CHRISTINA  [facility] Pager [phone]. If unable to reach me by pager, please call my cell phone at [phone].  *Please refer to [email or web address], password [email or web address] to get updated schedule on who will round on this patient, as hospitalists switch teams weekly. If 7PM-7AM, please contact night-coverage at [email or web address], password [email or web address] for any overnight needs.  04/19/2015, 9:00 AM

## 2015-04-19 NOTE — Progress Notes (Signed)
Patient with no urine output. Bladder scan showed 137-200 cc's. Pt denies abdominal pressure. IVF infusing at 100 cc/hr. Will continue to monitor closely.

## 2015-04-19 NOTE — Progress Notes (Signed)
Initial Nutrition Assessment  DOCUMENTATION CODES:   Not applicable  INTERVENTION:  - Provide 30 ml Prostat TID. Each supplement provides 100 kcals and 15 grams of protein.  - Provide Multivitamin with minerals daily.  - Continue to monitor for nutritional needs.    NUTRITION DIAGNOSIS:   Increased nutrient needs related to wound healing as evidenced by estimated needs.  GOAL:   Patient will meet greater than or equal to 90% of their needs  MONITOR:   PO intake, Supplement acceptance, Labs, Weight trends, Skin, I & O's  REASON FOR ASSESSMENT:   Malnutrition Screening Tool    ASSESSMENT:   75 y.o. male with PMH of stroke with left-sided hemiparalysis, CAD, s/p of CABG, hypertension, hyperlipidemia, GERD, depression, anxiety, tobacco abuse, alcohol abuse, carotid artery stenosis (s/p of RCEA), skin cancer, PAF on Eliquis, BPH, who presents with sacral wound infection.  Pt seen for MST. Pt with BMI categorized as overweight. Pt presents with unstagable pressure ulcer on sacrum, stage 2 pressure ulcer on heel, and stage 1 pressure ulcer on arm. Pt weight on 12/29/14 was 200 lbs. Pt current weight is 184 lbs. Pt has experienced a 8% weight loss in the past 4 months which is significant for time frame. Per chart, pt only ate 15% of lunch today.   Pt confused at time of visit. Pt answered some questions appropriately. Pt reports eating TID. Pt does not take nutritional supplements at home. Per chart, pt experienced a stroke which caused left sided hemiparalesis. Pt reports no feeding, chewing, or swallowing difficulties. Pt reports his UBW to be 180 lbs. Pt refused Ensure and Boost. Intern encouraged protein intake for wound healing and pt was tolerable to receiving Prostat.   NFPE: no muscle depletion, no fat depletion, and unable to assess edema.   Labs reviewed; Cl 115 mmol/L, Ca 7.8 mg/dl  Meds reviewed; Vitamin D 2000 IU, Vitamin E 400 units  Diet Order:  Diet Heart Room  service appropriate?: Yes with Assist; Fluid consistency:: Thin  Skin:  Wound (see comment) (PU: unstageable on sacrum, stage 2 on heel, stage 1 on arm)  Last BM:     Height:   Ht Readings from Last 1 Encounters:  12/29/14 5\' 11"  (1.803 m)    Weight:   Wt Readings from Last 1 Encounters:  04/18/15 184 lb 3.2 oz (83.553 kg)    Ideal Body Weight:     BMI:  Body mass index is 25.7 kg/(m^2).  Estimated Nutritional Needs:   Kcal:  2100-2300 kcals (25-28 kcals/kg)  Protein:  100-115 g (1.2-1.4 g/kg)  Fluid:  >2.0L  EDUCATION NEEDS:   No education needs identified at this time  Geoffery Lyons, [place] Dietetic Intern Pager [phone]

## 2015-04-19 NOTE — Progress Notes (Signed)
PT Cancellation Note  Patient Details Name: [name] MRN: [id number] DOB: 1940-04-10   Cancelled Treatment:    Reason Eval/Treat Not Completed: PT screened, no needs identified, will sign off; pt is painful with all movements, unable to follow commands consistently, per RN pt total care/from SNF; defer PT eval to SNF if indicated   [facility] 04/19/2015, 1:48 PM

## 2015-04-19 NOTE — Clinical Social Work Note (Signed)
Clinical Social Work Assessment  Patient Details  Name: [name] MRN: [id number] Date of Birth: 10/13/1940  Date of referral:  04/19/15               Reason for consult:  Facility Placement                Permission sought to share information with:  Facility Art therapist granted to share information::  Yes, Verbal Permission Granted  Name::        Agency::     Relationship::     Contact Information:     Housing/Transportation Living arrangements for the past 2 months:  [place] of Information:    Patient Interpreter Needed:  None Criminal Activity/Legal Involvement Pertinent to Current Situation/Hospitalization:  No - Comment as needed Significant Relationships:    Lives with:  Friends Do you feel safe going back to the place where you live?  No Need for family participation in patient care:  Yes (Comment)  Care giving concerns:  CSW received consult that patient was admitted from [facility].    Social Worker assessment / plan:  CSW spoke with patient's friend/HCPOA, [name] (ph#: [phone]) who informed CSW that she would prefer that he go to a different SNF at discharge. CSW sent information out & will follow-up with SNF bed offers.   Employment status:  Retired Nurse, adult PT Recommendations:  [place] / Referral to community resources:  [place]  Patient/Family's Response to care:  Patient's friend states that she has not been pleased with the care he's been receiving at [facility]. CSW confirmed with Bethena Roys at [facility] that patient was a long term resident (admitted in December) and is private pay for long term care.   Patient/Family's Understanding of and Emotional Response to Diagnosis, Current Treatment, and Prognosis:    Emotional Assessment Appearance:  Appears stated age Attitude/Demeanor/Rapport:    Affect (typically observed):     Orientation:  Oriented to Self, Oriented to Place, Oriented to  Time, Oriented to Situation Alcohol / Substance use:    Psych involvement (Current and /or in the community):     Discharge Needs  Concerns to be addressed:    Readmission within the last 30 days:    Current discharge risk:    Barriers to Discharge:      Standley Brooking, LCSW 04/19/2015, 3:23 PM

## 2015-04-19 NOTE — Progress Notes (Signed)
Preliminary results by tech- Venous Duplex Lower Ext. Completed. 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 Left leg, no evidence of DVT or SVT. Results given to patient's nurse. Oda Cogan, BS, RDMS, RVT

## 2015-04-19 NOTE — Progress Notes (Signed)
PT--Hydrotherapy --EVALUATION  04/19/15 1300  Subjective Assessment  Subjective pt with flat affect, responds at times  Patient and Family Stated Goals pt unable to state  Date of Onset 04/19/15  Prior Treatments wound care at [facility]  Evaluation and Treatment  Evaluation and Treatment Procedures Explained to Patient/Family Yes  Evaluation and Treatment Procedures Patient unable to consent due to mental status  Pressure Ulcer 04/19/15 Unstageable - Full thickness tissue loss in which the base of the ulcer is covered by slough (yellow, tan, gray, green or brown) and/or eschar (tan, brown or black) in the wound bed. sacral pressure ulcer  Date First Assessed/Time First Assessed: 04/19/15 1231   Location: Sacrum  Location Orientation: Posterior  Staging: Unstageable - Full thickness tissue loss in which the base of the ulcer is covered by slough (yellow, tan, gray, green or brown) and/o...  Dressing Type Moist to dry  Dressing Changed  Dressing Change Frequency Other (Comment) (6x per week)  State of Healing Eschar  Site / Wound Assessment Granulation tissue;Painful;Red;Yellow;Brown;Black  % Wound base Red or Granulating 40%  % Wound base Yellow 30%  % Wound base Black 30%  Peri-wound Assessment Maceration;Erythema (non-blanchable)  Wound Length (cm) 8 cm  Wound Width (cm) 8 cm  Wound Depth (cm) 4.5 cm  Undermining (cm) 1.5 (10:00 to 2:00 before debridement)  Drainage Amount Copious  Drainage Description Purulent;Odor (Foul)  Treatment Cleansed;Debridement (Selective);Hydrotherapy (Pulse lavage);Packing (Saline gauze)  Hydrotherapy  Pulsed lavage therapy - wound location sacrum  Pulsed Lavage with Suction (psi) 8 psi  Pulsed Lavage with Suction - Normal Saline Used 1000 mL  Pulsed Lavage Tip Tip with splash shield  Selective Debridement  Selective Debridement - Location superior portion and wound bed  Selective Debridement - Tools Used Forceps;Scissors  Selective Debridement - Tissue  Removed black/brown eschar  Wound Therapy - Assess/Plan/Recommendations  Wound Therapy - Clinical Statement pt with unstageable pressure ulcer, will benefit from hydrotherapy to facilitate wound healing   Factors Delaying/Impairing Wound Healing Multiple medical problems;Tobacco use  Hydrotherapy Plan Debridement;Dressing change;Pulsatile lavage with suction  Wound Therapy - Frequency 6X / week  Wound Therapy - Follow Up Recommendations Skilled nursing facility  Wound Plan pulsed lavage 6x/wk  Wound Therapy Goals - Improve the function of patient's integumentary system by progressing the wound(s) through the phases of wound healing by:  Decrease Necrotic Tissue to 40  Decrease Necrotic Tissue - Progress Goal set today  Increase Granulation Tissue to 60  Increase Granulation Tissue - Progress Goal set today  Kenyon Ana, PT Pager: [phone] 04/19/2015

## 2015-04-19 NOTE — Clinical Social Work Placement (Signed)
   CLINICAL SOCIAL WORK PLACEMENT  NOTE  Date:  04/19/2015  Patient Details  Name: [name] MRN: [id number] Date of Birth: Jul 13, 1940  Clinical Social Work is seeking post-discharge placement for this patient at the [place] level of care (*CSW will initial, date and re-position this form in  chart as items are completed):  Yes   Patient/family provided with [place] Work Department's list of facilities offering this level of care within the geographic area requested by the patient (or if unable, by the patient's family).  Yes   Patient/family informed of their freedom to choose among providers that offer the needed level of care, that participate in Medicare, Medicaid or managed care program needed by the patient, have an available bed and are willing to accept the patient.  Yes   Patient/family informed of [facility]'s ownership interest in [facility] and [facility], as well as of the fact that they are under no obligation to receive care at these facilities.  PASRR submitted to EDS on 04/19/15     PASRR number received on 04/19/15     Existing PASRR number confirmed on       FL2 transmitted to all facilities in geographic area requested by pt/family on 04/19/15     FL2 transmitted to all facilities within larger geographic area on       Patient informed that his/her managed care company has contracts with or will negotiate with certain facilities, including the following:            Patient/family informed of bed offers received.  Patient chooses bed at       Physician recommends and patient chooses bed at      Patient to be transferred to   on  .  Patient to be transferred to facility by       Patient family notified on   of transfer.  Name of family member notified:        PHYSICIAN       Additional Comment:    _______________________________________________ Standley Brooking, LCSW 04/19/2015, 3:28 PM

## 2015-04-19 NOTE — Consult Note (Signed)
WOC consulted for sacral wound and foot wound, however upon my arrival I have noted that [facility] is planning wound care, hydrotherapy and possible bedside debridement.  I will not follow this this patient for this reason, all of these options based on my assessment of the wound on the sacral area are exactly the same.    WOC wound consult note Reason for Consult: sacral wound and foot wound Wound type: Unstageable Pressure Injury: 9cm x 9cm x 2cm  Left lateral foot, some dry areas intact  Pressure Ulcer POA: Yes Measurement: see above Wound bed: 80% pink, non granular; 20% yellow/brown, particularly at wound edges on the left side from 6-12 o'clock and some in the central bed of the wound. Epibole of the wound edges  Drainage (amount, consistency, odor) moderate to heavy drainage. With odor from the sacrum No drainage from the foot wound, skin intact  Periwound: intact  Dressing procedure/placement/frequency: CCS is planning bedside debridement, hydrotherapy. LALM in place for pressure redistribution, will need at DC back to SNF. Prevalon boots to offload feet.  Discussed POC with patient and bedside nurse.  Re consult if needed, will not follow at this time. Thanks  Peyson Postema Kellogg, [place] [phone])

## 2015-04-20 DIAGNOSIS — L089 Local infection of the skin and subcutaneous tissue, unspecified: Secondary | ICD-10-CM

## 2015-04-20 DIAGNOSIS — T148 Other injury of unspecified body region: Secondary | ICD-10-CM

## 2015-04-20 DIAGNOSIS — I82401 Acute embolism and thrombosis of unspecified deep veins of right lower extremity: Secondary | ICD-10-CM

## 2015-04-20 DIAGNOSIS — I69354 Hemiplegia and hemiparesis following cerebral infarction affecting left non-dominant side: Secondary | ICD-10-CM

## 2015-04-20 DIAGNOSIS — I10 Essential (primary) hypertension: Secondary | ICD-10-CM

## 2015-04-20 DIAGNOSIS — I480 Paroxysmal atrial fibrillation: Secondary | ICD-10-CM

## 2015-04-20 LAB — URINE CULTURE: Culture: NO GROWTH

## 2015-04-20 LAB — GLUCOSE, CAPILLARY
GLUCOSE-CAPILLARY: 49 mg/dL — AB (ref 65–99)
GLUCOSE-CAPILLARY: 81 mg/dL (ref 65–99)
Glucose-Capillary: 62 mg/dL — ABNORMAL LOW (ref 65–99)

## 2015-04-20 MED ORDER — ENOXAPARIN SODIUM 80 MG/0.8ML ~~LOC~~ SOLN
80.0000 mg | Freq: Two times a day (BID) | SUBCUTANEOUS | Status: DC
  Administered 2015-04-20 – 2015-04-22 (×4): 80 mg via SUBCUTANEOUS
  Filled 2015-04-20 (×4): qty 0.8

## 2015-04-20 NOTE — Progress Notes (Signed)
Bedside debridement of stage III sacral decubitus ulcer performed with removal of black, necrotic tissue.  Would continue hydrotherapy, keep him off wound as much as possible.

## 2015-04-20 NOTE — Progress Notes (Signed)
ANTICOAGULATION CONSULT NOTE - Initial Consult  Pharmacy Consult for Lovenox Indication: DVT  Allergies  Allergen Reactions  . Sertraline Hcl     REACTION: heart palpitation    Patient Measurements: Weight: 184 lb 3.2 oz (83.553 kg) Heparin Dosing Weight:   Vital Signs: Temp: 99.3 F (37.4 C) (04/08 0656) Temp Source: Axillary (04/08 0656) BP: 118/77 mmHg (04/08 0656) Pulse Rate: 99 (04/08 0656)  Labs:  Recent Labs  04/18/15 1826 04/18/15 2136 04/19/15 0415  HGB 11.8*  --  9.5*  HCT 37.5*  --  29.8*  PLT 210  --  203  APTT  --  32  --   LABPROT  --  18.0*  --   INR  --  1.55*  --   CREATININE 0.91  --  0.85   Estimated Creatinine Clearance: 83.7 mL/min (by C-G formula based on Cr of 0.85).  Medical History: Past Medical History  Diagnosis Date  . Carotid arterial disease ([place])     s/p RCEA 11/2008 (Note: hx of negative cardiac nuc 03/2009)  . Hypertension   . Colonic polyp     cscopes 08/18/05 tics,polyps.Marland Kitchenall hyperplastic  . GERD (gastroesophageal reflux disease)     Barrets Esop. (per bx 08/2005) and HH f/u Dr.Schooler  . Anal warts   . Anal fissure   . Hemorrhoids   . 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 Panic attack   . Osteoarthritis   . Depression   . H/O ETOH abuse     Former. Quit ~2012  . BPH (benign prostatic hyperplasia)     s/p surgery  . Anxiety and depression 02/14/2009    Qualifier: Diagnosis of  By: Larose Kells MD, Alda Berthold.    Medications:  Scheduled:  . antiseptic oral rinse  7 mL Mouth Rinse q12n4p  . chlorhexidine  15 mL Mouth Rinse BID  . cholecalciferol  2,000 Units Oral Daily  . divalproex  125 mg Oral BID  . feeding supplement (PRO-STAT SUGAR FREE 64)  30 mL Oral TID  . metoprolol  50 mg Oral BID  . multivitamin with minerals  1 tablet Oral Daily  . nicotine  21 mg Transdermal Daily  . pantoprazole  40 mg Oral Daily  . piperacillin-tazobactam (ZOSYN)  IV  3.375 g Intravenous Q8H  . pravastatin  40 mg  Oral Daily  . sodium chloride flush  3 mL Intravenous Q12H  . vancomycin  1,250 mg Intravenous Q12H  . vitamin E  400 Units Oral Daily   Assessment: 59 yoM SNF patient. Complaint of 3 wk hx of worsening sacral pain, crepitus noted- plan wound debridement. PMHx: stroke with resultant left hemiparesis, atrial fibrillation, hypertension, hyperlipidemia, coronary artery disease status post CABG, depression, ETOH abuse.  PTA Apixaban 5mg  bid, last dose not recorded on MAG, PTA Plavix with last dose 4/6 am.  Begin Lovenox for new RLE DVT per doppler 4/7  Hgb decreased from admit, PLT wnl  Goal of Therapy:  Anti-Xa level 0.6-1 units/ml 4hrs after LMWH dose given Monitor platelets by anticoagulation protocol: Yes   Plan:   Lovenox 80mg  SQ bid  Daily CBC  Minda Ditto PharmD Pager [phone] 04/20/2015, 8:49 AM

## 2015-04-20 NOTE — Progress Notes (Signed)
Progress Note   [name] [id number] DOB: [birth year] DOA: 04/18/2015 PCP: Kathlene November, MD   Brief Narrative:   [name] is an 75 y.o. male with a PMH of stroke with left-sided hemiparalysis, CAD status post CABG, hypertension, hyperlipidemia, alcohol abuse, PAF on Eliquis who was admitted 04/18/15 with sacral wound infection. In the ED, the patient was found to have a WBC of 17.9 and a lactate of 2.43. He was febrile, tachycardic, and tachypneic.  Assessment/Plan:   Principal Problem:   Sacral Wound infection/Sacral decubitus ulcer/sepsis - Bedside sharp debridement planned for today as he has been off Eliquis and Plavix for 48 hours.  - on Vanc and Zosyn- blood cultures negative  Active Problems:  Right leg DVT - start Lovenox   Anxiety and depression Continue Depakote and Ativan as needed.    Essential hypertension Continue metoprolol. Norvasc currently on hold.    GERD Continue Protonix.    Hyperlipidemia Continue pravastatin.    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 Continue pravastatin.    History of ETOH abuse    Paroxysmal atrial fibrillation (HCC) CHA2DS2-VASc Score is 5, needs oral anticoagulation. Patient is on Eliquis which is on hold for now. Metoprolol for rate control    Family Communication/Anticipated D/C date and plan/Code Status   Family Communication:   Disposition Plan/date: From [facility]. Will return there in a few days. Code Status: Full code.   Procedures and diagnostic studies:   No results found.  Medical Consultants:    None.  Anti-Infectives:   Vancomycin 04/18/15---> Zosyn 04/18/15--->   Subjective:   [name] is confused. Has no complaints.   Objective:    Filed Vitals:   04/19/15 1436 04/19/15 1445 04/19/15 2032  04/20/15 0656  BP: 99/37 90/44 113/52 118/77  Pulse: 69   99  Temp: 98.5 F (36.9 C)  99.3 F (37.4 C) 99.3 F (37.4 C)  TempSrc: Oral  Axillary Axillary  Resp: 20  20 20   Weight:      SpO2: 100%  96% 95%    Intake/Output Summary (Last 24 hours) at 04/20/15 1246 Last data filed at 04/20/15 0948  Gross per 24 hour  Intake 2168.33 ml  Output   1050 ml  Net 1118.33 ml   Filed Weights   04/18/15 2000 04/18/15 2210  Weight: 90.719 kg (200 lb) 83.553 kg (184 lb 3.2 oz)    Exam: Gen:  Confused to place and time. Moaning and calling out on and off.  Cardiovascular:  RRR, No M/R/G Respiratory:  CTA anteriorly Gastrointestinal:  Abdomen soft, NT/ND, + BS Extremities:  no edema   Data Reviewed:    Labs: Basic Metabolic Panel:  Recent Labs Lab 04/18/15 1826 04/19/15 0415  NA 143 146*  K 4.3 4.5  CL 109 115*  CO2 27 27  GLUCOSE 105* 87  BUN 22* 18  CREATININE 0.91 0.85  CALCIUM 8.4* 7.8*   GFR Estimated Creatinine Clearance: 83.7 mL/min (by C-G formula based on Cr of 0.85). Liver Function Tests:  Recent Labs Lab 04/18/15 1826  AST 48*  ALT 48  ALKPHOS 67  BILITOT 0.1*  PROT 6.7  ALBUMIN 2.0*   Coagulation profile  Recent Labs Lab 04/18/15 2136  INR 1.55*    CBC:  Recent Labs Lab 04/18/15 1826 04/19/15  0415  WBC 17.9* 16.3*  NEUTROABS 14.0*  --   HGB 11.8* 9.5*  HCT 37.5* 29.8*  MCV 96.2 96.1  PLT 210 203   CBG:  Recent Labs Lab 04/19/15 0716 04/20/15 0821 04/20/15 0911 04/20/15 1021  GLUCAP 66 49* 62* 81   Sepsis Labs:  Recent Labs Lab 04/18/15 1826 04/18/15 1847 04/18/15 2136 04/19/15 0415  PROCALCITON  --   --  <0.10  --   WBC 17.9*  --   --  16.3*  LATICACIDVEN  --  2.43*  --   --    Microbiology Recent Results (from the past 240 hour(s))  Urine culture     Status: None (Preliminary result)   Collection Time: 04/18/15  6:22 PM  Result Value Ref Range Status   Specimen Description URINE, CATHETERIZED  Final    Special Requests NONE  Final   Culture   Final    NO GROWTH < 24 HOURS Performed at [facility]    Report Status PENDING  Incomplete  Blood culture (routine x 2)     Status: None (Preliminary result)   Collection Time: 04/18/15  6:26 PM  Result Value Ref Range Status   Specimen Description BLOOD LEFT FOREARM  Final   Special Requests BOTTLES DRAWN AEROBIC AND ANAEROBIC 5CC  Final   Culture   Final    NO GROWTH 2 DAYS Performed at [facility]    Report Status PENDING  Incomplete  Blood culture (routine x 2)     Status: None (Preliminary result)   Collection Time: 04/18/15  6:33 PM  Result Value Ref Range Status   Specimen Description LEFT ANTECUBITAL  Final   Special Requests BOTTLES DRAWN AEROBIC AND ANAEROBIC 5CC  Final   Culture   Final    NO GROWTH 2 DAYS Performed at [facility]    Report Status PENDING  Incomplete  MRSA PCR Screening     Status: None   Collection Time: 04/18/15 10:25 PM  Result Value Ref Range Status   MRSA by PCR NEGATIVE NEGATIVE Final    Comment:        The GeneXpert MRSA Assay (FDA approved for NASAL specimens only), is one component of a comprehensive MRSA colonization surveillance program. It is not intended to diagnose MRSA infection nor to guide or monitor treatment for MRSA infections.      Medications:   . antiseptic oral rinse  7 mL Mouth Rinse q12n4p  . chlorhexidine  15 mL Mouth Rinse BID  . cholecalciferol  2,000 Units Oral Daily  . divalproex  125 mg Oral BID  . enoxaparin (LOVENOX) injection  80 mg Subcutaneous BID  . feeding supplement (PRO-STAT SUGAR FREE 64)  30 mL Oral TID  . metoprolol  50 mg Oral BID  . multivitamin with minerals  1 tablet Oral Daily  . nicotine  21 mg Transdermal Daily  . pantoprazole  40 mg Oral Daily  . piperacillin-tazobactam (ZOSYN)  IV  3.375 g Intravenous Q8H  . pravastatin  40 mg Oral Daily  . sodium chloride flush  3 mL Intravenous Q12H  . vancomycin  1,250 mg  Intravenous Q12H  . vitamin E  400 Units Oral Daily   Continuous Infusions:     LOS: 2 days   Debbe Odea, MD [facility] Pager : [email or web address], password [email or web address] for any overnight needs.  04/20/2015, 12:46 PM

## 2015-04-20 NOTE — Progress Notes (Signed)
Eating breakfast.  Needs to be cleaned up after having BM.  Will return later today to do bedside wound debridement.

## 2015-04-21 LAB — CBC
HEMATOCRIT: 29 % — AB (ref 39.0–52.0)
HEMOGLOBIN: 9.4 g/dL — AB (ref 13.0–17.0)
MCH: 30.6 pg (ref 26.0–34.0)
MCHC: 32.4 g/dL (ref 30.0–36.0)
MCV: 94.5 fL (ref 78.0–100.0)
Platelets: 158 10*3/uL (ref 150–400)
RBC: 3.07 MIL/uL — AB (ref 4.22–5.81)
RDW: 15.4 % (ref 11.5–15.5)
WBC: 10.2 10*3/uL (ref 4.0–10.5)

## 2015-04-21 LAB — GLUCOSE, CAPILLARY
GLUCOSE-CAPILLARY: 63 mg/dL — AB (ref 65–99)
GLUCOSE-CAPILLARY: 78 mg/dL (ref 65–99)

## 2015-04-21 LAB — BASIC METABOLIC PANEL
ANION GAP: 5 (ref 5–15)
BUN: 12 mg/dL (ref 6–20)
CALCIUM: 7.9 mg/dL — AB (ref 8.9–10.3)
CO2: 25 mmol/L (ref 22–32)
Chloride: 111 mmol/L (ref 101–111)
Creatinine, Ser: 0.91 mg/dL (ref 0.61–1.24)
GFR calc Af Amer: >60 mL/min (ref >60)
Glucose, Bld: 78 mg/dL (ref 65–99)
POTASSIUM: 4.3 mmol/L (ref 3.5–5.1)
Sodium: 141 mmol/L (ref 135–145)

## 2015-04-21 LAB — LACTIC ACID, PLASMA: LACTIC ACID, VENOUS: 1.1 mmol/L (ref 0.5–2.0)

## 2015-04-21 NOTE — Progress Notes (Signed)
PT/HYDRO NOTE  Pt had bedside debridement yesterday; will resume hydrotherapy on Monday (frequency 6x/week).

## 2015-04-21 NOTE — Progress Notes (Signed)
Pharmacy Antibiotic Follow-up Note  [name] is a 75 y.o. year-old male admitted on 04/18/2015. The patient is currently on day #4 of Vancomycin & Zosyn for sacral wound infection/decubitus ulcer/sepsis. Patient underwent bedside debridement on 4/8.    Assessment/Plan: Continue present doses of Vancomycin and Zosyn.  Check Vancomycin trough level prior to next dose this evening. Goal trough 15-20 mcg/mL. Continue to monitor renal function, cultures, clinical course.  Temp (24hrs), Avg:98.6 F (37 C), Min:97.8 F (36.6 C), Max:100.1 F (37.8 C)   Recent Labs Lab 04/18/15 1826 04/19/15 0415 04/21/15 0753  WBC 17.9* 16.3* 10.2     Recent Labs Lab 04/18/15 1826 04/19/15 0415 04/21/15 0753  CREATININE 0.91 0.85 0.91   Estimated Creatinine Clearance: 78.2 mL/min (by C-G formula based on Cr of 0.91).    Allergies  Allergen Reactions  . Sertraline Hcl     REACTION: heart palpitation    Antimicrobials this admission: 4/6 Vancomycin  >>  4/6 Zosyn  >>   Levels/dose changes this admission: 4/9 1730 VT: pending  Microbiology results: 4/6 BCx: NGTD 4/6 UCx: NGF 4/6 MRSA PCR: negative  Thank you for allowing pharmacy to be a part of this patient's care.   Lindell Spar, PharmD, BCPS Pager: [phone] 04/21/2015 2:14 PM

## 2015-04-21 NOTE — Progress Notes (Signed)
Patient CBG 63 without symptoms patient drank 4 oz of juice and ate some of breakfast tray.

## 2015-04-21 NOTE — Care Management Important Message (Signed)
Important Message  Patient Details  Name: [name] MRN: [id number] Date of Birth: 1940-09-12   Medicare Important Message Given:  Yes    Apolonio Schneiders, RN 04/21/2015, 10:31 AM

## 2015-04-21 NOTE — Progress Notes (Addendum)
Progress Note   [name] [id number] DOB: [birth year] DOA: 04/18/2015 PCP: Kathlene November, MD   Brief Narrative:   [name] is an 75 y.o. male with a PMH of stroke with expressive aphasia and left-sided hemiparalysis, CAD status post CABG, hypertension, hyperlipidemia, prior nicotine and alcohol abuse, PAF on Eliquis. CVA was in 12/16 and he was subsequently sent to [facility] on Plavix. Later found to have A-fib with loop recorder and started on Eliquis.  He was admitted 04/18/15 with sacral wound infection. In the ED, the patient was found to have a WBC of 17.9 and a lactate of 2.43. He was febrile, tachycardic, and tachypneic. General surgery consulted and recommending holding Eliquis and Plavix for 48 hrs in order to perform bedside debridement. RLE found to be swollen- Duplex reveals acute and chronic DVT.   Assessment/Plan:   Principal Problem:   Sacral Wound infection/Sacral decubitus ulcer/sepsis - Bedside sharp debridement done on 4/8 by gen surgery  - on Vanc and Zosyn- blood cultures negative- MRSA PCR negative- stop Vanc - daily hydrotherapy per sugery - cont to hold Eliquis in case further debridement needed. - air mattress  Active Problems:  Right leg DVT - started Lovenox while Eliquis on hold   agitation/ confusion- h/o anxiety prior to CVA - Continue Depakote and Ativan as needed.    Essential hypertension Continue metoprolol. Norvasc currently on hold- BP low/normal    GERD Continue Protonix.    Hyperlipidemia Continue pravastatin.    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 Per Dr Jackalyn Lombard note after reviewing loop recorder, Plavix was supposed to be stopped and Eliquis started.  Dr Leonie Man was in agreement with this.   Continue pravastatin.    History of ETOH abuse    Paroxysmal  atrial fibrillation (HCC) CHA2DS2-VASc Score is 5, needs oral anticoagulation.  Eliquis which is on hold for now. Metoprolol for rate control    Family Communication/Anticipated D/C date and plan/Code Status   Family Communication:  POA Audrie Lia Disposition Plan/date: From [facility]. Will return there in a few days. Code Status: DNR after discussion with POA   Procedures and diagnostic studies:   No results found.  Medical Consultants:    None.  Anti-Infectives:   Vancomycin 04/18/15---> Zosyn 04/18/15--->   Subjective:   [name] is confused. Has no complaints.   Objective:    Filed Vitals:   04/21/15 0059 04/21/15 0225 04/21/15 0425 04/21/15 1033  BP:  122/60 116/50 113/52  Pulse:  80 72 83  Temp: 97.8 F (36.6 C) 97.8 F (36.6 C) 98.5 F (36.9 C)   TempSrc: Axillary Oral Oral   Resp:  18 18 18   Weight:      SpO2:  97% 95% 99%    Intake/Output Summary (Last 24 hours) at 04/21/15 1223 Last data filed at 04/21/15 0830  Gross per 24 hour  Intake   1070 ml  Output   1850 ml  Net   -780 ml   Filed Weights   04/18/15 2000 04/18/15 2210  Weight: 90.719 kg (200 lb) 83.553 kg (184 lb 3.2 oz)    Exam: Gen:  Confused to place and time.   Cardiovascular:  RRR, No M/R/G Respiratory:  CTA anteriorly Gastrointestinal:  Abdomen soft, NT/ND, + BS Extremities:  no edema Skin:  large stage 3 sacral decub with foul odor   Data Reviewed:    Labs: Basic Metabolic Panel:  Recent Labs Lab 04/18/15 1826 04/19/15 0415 04/21/15 0753  NA 143 146* 141  K 4.3 4.5 4.3  CL 109 115* 111  CO2 27 27 25   GLUCOSE 105* 87 78  BUN 22* 18 12  CREATININE 0.91 0.85 0.91  CALCIUM 8.4* 7.8* 7.9*   GFR Estimated Creatinine Clearance: 78.2 mL/min (by C-G formula based on Cr of 0.91). Liver Function Tests:  Recent Labs Lab 04/18/15 1826  AST 48*  ALT 48  ALKPHOS 67  BILITOT 0.1*  PROT 6.7  ALBUMIN 2.0*   Coagulation profile  Recent  Labs Lab 04/18/15 2136  INR 1.55*    CBC:  Recent Labs Lab 04/18/15 1826 04/19/15 0415 04/21/15 0753  WBC 17.9* 16.3* 10.2  NEUTROABS 14.0*  --   --   HGB 11.8* 9.5* 9.4*  HCT 37.5* 29.8* 29.0*  MCV 96.2 96.1 94.5  PLT 210 203 158   CBG:  Recent Labs Lab 04/19/15 0716 04/20/15 0821 04/20/15 0911 04/20/15 1021 04/21/15 0739  GLUCAP 66 49* 62* 81 63*   Sepsis Labs:  Recent Labs Lab 04/18/15 1826 04/18/15 1847 04/18/15 2136 04/19/15 0415 04/21/15 0449 04/21/15 0753  PROCALCITON  --   --  <0.10  --   --   --   WBC 17.9*  --   --  16.3*  --  10.2  LATICACIDVEN  --  2.43*  --   --  1.1  --    Microbiology Recent Results (from the past 240 hour(s))  Urine culture     Status: None   Collection Time: 04/18/15  6:22 PM  Result Value Ref Range Status   Specimen Description URINE, CATHETERIZED  Final   Special Requests NONE  Final   Culture   Final    NO GROWTH 1 DAY Performed at [facility]    Report Status 04/20/2015 FINAL  Final  Blood culture (routine x 2)     Status: None (Preliminary result)   Collection Time: 04/18/15  6:26 PM  Result Value Ref Range Status   Specimen Description BLOOD LEFT FOREARM  Final   Special Requests BOTTLES DRAWN AEROBIC AND ANAEROBIC 5CC  Final   Culture   Final    NO GROWTH 2 DAYS Performed at [facility]    Report Status PENDING  Incomplete  Blood culture (routine x 2)     Status: None (Preliminary result)   Collection Time: 04/18/15  6:33 PM  Result Value Ref Range Status   Specimen Description LEFT ANTECUBITAL  Final   Special Requests BOTTLES DRAWN AEROBIC AND ANAEROBIC 5CC  Final   Culture   Final    NO GROWTH 2 DAYS Performed at [facility]    Report Status PENDING  Incomplete  MRSA PCR Screening     Status: None   Collection Time: 04/18/15 10:25 PM  Result Value Ref Range Status   MRSA by PCR NEGATIVE NEGATIVE Final    Comment:        The GeneXpert MRSA Assay (FDA approved for NASAL  specimens only), is one component of a comprehensive MRSA colonization surveillance program. It is not intended to diagnose MRSA infection nor to guide or monitor treatment for MRSA infections.      Medications:   . antiseptic oral rinse  7 mL Mouth Rinse q12n4p  . chlorhexidine  15 mL Mouth Rinse BID  . cholecalciferol  2,000 Units Oral Daily  . divalproex  125 mg Oral BID  . enoxaparin (LOVENOX) injection  80 mg Subcutaneous BID  . feeding supplement (PRO-STAT SUGAR FREE 64)  30 mL Oral TID  . metoprolol  50 mg Oral BID  . multivitamin with minerals  1 tablet Oral Daily  . pantoprazole  40 mg Oral Daily  . piperacillin-tazobactam (ZOSYN)  IV  3.375 g Intravenous Q8H  . pravastatin  40 mg Oral Daily  . sodium chloride flush  3 mL Intravenous Q12H  . vancomycin  1,250 mg Intravenous Q12H  . vitamin E  400 Units Oral Daily   Continuous Infusions:     LOS: 3 days   Deztinee Lohmeyer, MD [facility] Pager : [email or web address], password [email or web address] for any overnight needs.  04/21/2015, 12:23 PM

## 2015-04-22 DIAGNOSIS — I63511 Cerebral infarction due to unspecified occlusion or stenosis of right middle cerebral artery: Secondary | ICD-10-CM

## 2015-04-22 DIAGNOSIS — F418 Other specified anxiety disorders: Secondary | ICD-10-CM

## 2015-04-22 DIAGNOSIS — A419 Sepsis, unspecified organism: Principal | ICD-10-CM

## 2015-04-22 LAB — GLUCOSE, CAPILLARY: Glucose-Capillary: 60 mg/dL — ABNORMAL LOW (ref 65–99)

## 2015-04-22 MED ORDER — AMOXICILLIN-POT CLAVULANATE 875-125 MG PO TABS
1.0000 | ORAL_TABLET | Freq: Two times a day (BID) | ORAL | Status: DC
  Administered 2015-04-22 – 2015-04-23 (×2): 1 via ORAL
  Filled 2015-04-22 (×2): qty 1

## 2015-04-22 MED ORDER — PRO-STAT SUGAR FREE PO LIQD: 30.0000 mL | Freq: Three times a day (TID) | ORAL | Status: DC

## 2015-04-22 MED ORDER — AMOXICILLIN-POT CLAVULANATE 875-125 MG PO TABS: 1.0000 | ORAL_TABLET | Freq: Two times a day (BID) | ORAL | Status: DC

## 2015-04-22 MED ORDER — APIXABAN 5 MG PO TABS: 5.0000 mg | ORAL_TABLET | Freq: Two times a day (BID) | ORAL | Status: DC

## 2015-04-22 MED ORDER — APIXABAN 5 MG PO TABS
10.0000 mg | ORAL_TABLET | Freq: Two times a day (BID) | ORAL | Status: DC
  Administered 2015-04-22 – 2015-04-23 (×2): 10 mg via ORAL
  Filled 2015-04-22 (×2): qty 2

## 2015-04-22 NOTE — Progress Notes (Signed)
[place] for Lovenox --> Apixaban Indication: DVT, atrial fibrillation  Allergies  Allergen Reactions  . Sertraline Hcl     REACTION: heart palpitation     Patient Measurements: Weight: 184 lb 3.2 oz (83.553 kg)  Vital Signs: Temp: 97.8 F (36.6 C) (04/10 1252) Temp Source: Axillary (04/10 1252) BP: 111/55 mmHg (04/10 1252) Pulse Rate: 93 (04/10 1252)  Labs:  Recent Labs  04/21/15 0753  HGB 9.4*  HCT 29.0*  PLT 158  CREATININE 0.91    Estimated Creatinine Clearance: 78.2 mL/min (by C-G formula based on Cr of 0.91).   Medical History: Past Medical History  Diagnosis Date  . Carotid arterial disease ([place])     s/p RCEA 11/2008 (Note: hx of negative cardiac nuc 03/2009)  . Hypertension   . Colonic polyp     cscopes 08/18/05 tics,polyps.Marland Kitchenall hyperplastic  . GERD (gastroesophageal reflux disease)     Barrets Esop. (per bx 08/2005) and HH f/u Dr.Schooler  . Anal warts   . Anal fissure   . Hemorrhoids   . 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 Panic attack   . Osteoarthritis   . Depression   . H/O ETOH abuse     Former. Quit ~2012  . BPH (benign prostatic hyperplasia)     s/p surgery  . Anxiety and depression 02/14/2009    Qualifier: Diagnosis of  By: Larose Kells MD, [place]      Assessment: 22 y/oM with PMH of stroke with left-sided hemiparalysis, CAD s/p CABG, HTN, HLD, GERD, EtOH abuse, PAF on Eliquis, BPH who presented to [facility] ED on 04/18/15 with sacral wound infection for which he underwent bedside debridement. Duplex of lower extremities revealed acute and chronic R DVT. Patient initially placed on Lovenox, now asked to transition to Eliquis.   Last dose of Lovenox 80mg  today at 0904  SCr WNL  CBC relatively stable  No bleeding issues reported  Goal of Therapy:  Treatment of DVT Absence of bleeding Monitor platelets by anticoagulation protocol: Yes   Plan:   D/C lovenox.  Start  apixaban 10mg  PO BID at 9pm tonight x 5 days (as has completed 2 days of treatment dose lovenox-discussed with TRH MD), then 5mg  PO BID thereafter.  Monitor CBC and for s/s of bleeding.  Will provide education to patient prior to discharge home.   Lindell Spar, PharmD, BCPS Pager: [phone] 04/22/2015 4:52 PM

## 2015-04-22 NOTE — Progress Notes (Signed)
   04/22/15 1600 Hydrotherapy treatment.  Subjective Assessment  Subjective pt with flat affect, responds at times  Patient and Family Stated Goals pt unable to state  Date of Onset 04/19/15  Prior Treatments wound care at [facility]  Evaluation and Treatment  Evaluation and Treatment Procedures Explained to Patient/Family Yes  Evaluation and Treatment Procedures Patient unable to consent due to mental status  Pressure Ulcer 04/19/15 Unstageable - Full thickness tissue loss in which the base of the ulcer is covered by slough (yellow, tan, gray, green or brown) and/or eschar (tan, brown or black) in the wound bed. sacral pressure ulcer  Date First Assessed/Time First Assessed: 04/19/15 1231   Location: Sacrum  Location Orientation: Posterior  Staging: Unstageable - Full thickness tissue loss in which the base of the ulcer is covered by slough (yellow, tan, gray, green or brown) and/o...  Dressing Type Moist to dry  Dressing Changed  Dressing Change Frequency Other (Comment) (6x per week)  State of Healing Eschar  Site / Wound Assessment Granulation tissue;Painful;Red;Yellow;Brown;Black  % Wound base Red or Granulating 75%  % Wound base Yellow 20%  % Wound base Black 5%  Peri-wound Assessment Maceration;Erythema (non-blanchable)  Wound Length (cm) 8 cm  Wound Width (cm) 8 cm  Wound Depth (cm) 4.5 cm  Undermining (cm) 1.5  Drainage Amount Copious  Drainage Description Purulent;Odor (Foul)  Hydrotherapy  Pulsed lavage therapy - wound location sacrum  Pulsed Lavage with Suction (psi) 8 psi  Pulsed Lavage with Suction - Normal Saline Used 1000 mL  Pulsed Lavage Tip Tip with splash shield  Wound Therapy - Assess/Plan/Recommendations  Wound Therapy - Clinical Statement s/p surgical debridement  at bedside on 04/20/15. there is decreased black eschar, wound remains large. Patient yells out at times. Given breaks .  Wound Therapy - Functional Problem List bedbound  Factors Delaying/Impairing Wound  Healing Multiple medical problems;Tobacco use;Immobility  Hydrotherapy Plan Debridement;Dressing change;Pulsatile lavage with suction  Wound Therapy - Frequency 6X / week  Wound Therapy - Follow Up Recommendations Skilled nursing facility  Wound Plan pulsed lavage 6x/wk  Wound Therapy Goals - Improve the function of patient's integumentary system by progressing the wound(s) through the phases of wound healing by:  Decrease Necrotic Tissue to 40  Decrease Necrotic Tissue - Progress Progressing toward goal  Increase Granulation Tissue to 60  Increase Granulation Tissue - Progress Progressing toward goal  Goals/treatment plan/discharge plan were made with and agreed upon by patient/family No, Patient unable to participate in goals/treatment/discharge plan and family unavailable  Time For Goal Achievement 2 weeks  Wound Therapy - Potential for Goals Poor  Tresa Endo PT [phone]

## 2015-04-22 NOTE — Clinical Social Work Placement (Signed)
Patient has a bed at [facility]. CSW has completed FL2 & will continue to follow and assist with discharge when ready.    Raynaldo Opitz, [facility] Clinical Social Worker cell #: [phone]     CLINICAL SOCIAL WORK PLACEMENT  NOTE  Date:  04/22/2015  Patient Details  Name: [name] MRN: [id number] Date of Birth: 04/29/1940  Clinical Social Work is seeking post-discharge placement for this patient at the [place] level of care (*CSW will initial, date and re-position this form in  chart as items are completed):  Yes   Patient/family provided with [facility] Work Department's list of facilities offering this level of care within the geographic area requested by the patient (or if unable, by the patient's family).  Yes   Patient/family informed of their freedom to choose among providers that offer the needed level of care, that participate in Medicare, Medicaid or managed care program needed by the patient, have an available bed and are willing to accept the patient.  Yes   Patient/family informed of [place]'s ownership interest in [facility] and [facility], as well as of the fact that they are under no obligation to receive care at these facilities.  PASRR submitted to EDS on 04/19/15     PASRR number received on 04/19/15     Existing PASRR number confirmed on       FL2 transmitted to all facilities in geographic area requested by pt/family on 04/19/15     FL2 transmitted to all facilities within larger geographic area on       Patient informed that his/her managed care company has contracts with or will negotiate with certain facilities, including the following:        Yes   Patient/family informed of bed offers received.  Patient chooses bed at [facility]     Physician recommends and patient chooses bed at      Patient to be transferred to [facility] on   .  Patient to be transferred to facility by       Patient family notified on   of transfer.  Name of family member notified:        PHYSICIAN       Additional Comment:    _______________________________________________ Standley Brooking, LCSW 04/22/2015, 2:53 PM

## 2015-04-22 NOTE — Discharge Summary (Addendum)
Physician Discharge Summary  [name] [id number] DOB: [birth year] DOA: 04/18/2015  PCP: Kathlene November, MD  Admit date: 04/18/2015 Discharge date: 04/23/2015  Time spent: 60  minutes   Recommendations for Outpatient Follow-up:  1. Wound care, air mattress 2. DNR- discussed with his POA  Discharge Condition: stable    Discharge Diagnoses:  Principal Problem:   Sacral Wound infection Active Problems:   Sepsis ([place])   Pressure ulcer   Right leg DVT (HCC)   Anxiety and depression   Essential hypertension   GERD   Hyperlipidemia   CAD (coronary artery disease)   Stroke due to occlusion of right middle cerebral artery (HCC)   Hemiparesis affecting left side as late effect of stroke (HCC)   Paroxysmal atrial fibrillation (HCC)   History of present illness:  [name] is an 75 y.o. male with a PMH of stroke with expressive aphasia and left-sided hemiparalysis, CAD status post CABG, hypertension, hyperlipidemia, prior nicotine and alcohol abuse, PAF on Eliquis. CVA was in 12/16 and he was subsequently sent to [facility] on Plavix. Later found to have A-fib with loop recorder and started on Eliquis. His POA [name] stated that after his stroke, he initially appears to do well with rehab but eventually stopped doing rehab and was kept in the bed all day long. He was admitted 04/18/15 when he was sent for debridement of a stage 3 large sacral decubitus ulcer.  In the ED, the patient was found to have a WBC of 17.9 and a lactate of 2.43. He was febrile, tachycardic, and tachypneic. General surgery consulted and recommending holding Eliquis and Plavix for 48 hrs in order to perform bedside debridement. RLE found to be swollen- Duplex reveals acute and chronic DVT.    Hospital Course:  Principal Problem:  Infection of Sacral decubitus ulcer--sepsis - WBC 17, temp 100, tachycardia, mild lactic acidosis - now normalized - Bedside sharp excisional debridement done on 4/8 by gen  surgery  - on Vanc and Zosyn- blood cultures negative- MRSA PCR negative- stopped Vanc- transition to Augmentin today for 7 more days - BID hydrotherapy per sugery while he is here - if his SNF cannot do hydrotherapy, then can have BID wet to dry dressings and ongoing wound care - air mattress at SNF  Active Problems:  Acute and chronic Right leg DVT - started Lovenox while Eliquis on hold - resumed Eliquis at 10 mg BID for 7 more day and then decrease to 5 mg BID   Stroke due to occlusion of right middle cerebral artery (HCC)/hemiparesis affecting the left side as a late effect of stroke Plavix on hold. Per Dr Jackalyn Lombard note after reviewing loop recorder, Plavix was supposed to be stopped and Eliquis started. Dr Leonie Man was in agreement with this.  Continue pravastatin.   H/o anxiety prior to CVA - Continue Depakote and Ativan as needed.  Dementia? - is not aware of date, month, year, where he is and why   Essential hypertension Continue metoprolol. Norvasc currently on hold- BP low/normal   GERD Continue Protonix.   Hyperlipidemia Continue pravastatin.   CAD (coronary artery disease) Continue metoprolol-    History of ETOH abuse and nicotine abuse - prior to CVA- has since been in nursing home   Paroxysmal atrial fibrillation (HCC) CHA2DS2-VASc Score is 5  Eliquis   Metoprolol for rate control   Procedures: Venous Duplex 04/19/15 Findings consistent with acute deep vein thrombosis involving the  right common femoral vein and proximal femoral  vein. 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  Consultations:  gen surgery  Discharge Exam: Filed Weights   04/18/15 2000 04/18/15 2210  Weight: 90.719 kg (200 lb) 83.553 kg (184 lb 3.2 oz)   Filed Vitals:   04/22/15 2045 04/23/15 0448  BP: 129/57 111/48  Pulse: 103 79  Temp: 98.9 F (37.2 C) 98.3 F (36.8 C)  Resp: 16 16    General: AAO x 3, no  distress Cardiovascular: RRR, no murmurs  Respiratory: clear to auscultation bilaterally GI: soft, non-tender, non-distended, bowel sound positive  Discharge Instructions You were cared for by a hospitalist during your hospital stay. If you have any questions about your discharge medications or the care you received while you were in the hospital after you are discharged, you can call the unit and asked to speak with the hospitalist on call if the hospitalist that took care of you is not available. Once you are discharged, your primary care physician will handle any further medical issues. Please note that NO REFILLS for any discharge medications will be authorized once you are discharged, as it is imperative that you return to your primary care physician (or establish a relationship with a primary care physician if you do not have one) for your aftercare needs so that they can reassess your need for medications and monitor your lab values.      Discharge Instructions    Diet - low sodium heart healthy    Complete by:  As directed      Increase activity slowly    Complete by:  As directed             Medication List    STOP taking these medications        amLODipine 10 MG tablet  Commonly known as:  NORVASC     clopidogrel 75 MG tablet  Commonly known as:  PLAVIX     pravastatin 40 MG tablet  Commonly known as:  PRAVACHOL      TAKE these medications        amoxicillin-clavulanate 875-125 MG tablet  Commonly known as:  AUGMENTIN  Take 1 tablet by mouth every 12 (twelve) hours.     apixaban 5 MG Tabs tablet  Commonly known as:  ELIQUIS  Take 2 tablets (10 mg total) by mouth 2 (two) times daily.     apixaban 5 MG Tabs tablet  Commonly known as:  ELIQUIS  Take 1 tablet (5 mg total) by mouth 2 (two) times daily.  Start taking on:  04/30/2015     atorvastatin 10 MG tablet  Commonly known as:  LIPITOR  Take 10 mg by mouth daily.     divalproex 125 MG capsule  Commonly  known as:  DEPAKOTE SPRINKLE  Take 125 mg by mouth 2 (two) times daily.     feeding supplement (PRO-STAT SUGAR FREE 64) Liqd  Take 30 mLs by mouth 3 (three) times daily.     LORazepam 0.5 MG tablet  Commonly known as:  ATIVAN  Take 1 tablet (0.5 mg total) by mouth 2 (two) times daily as needed for anxiety.     metoprolol 50 MG tablet  Commonly known as:  LOPRESSOR  Take 1 tablet (50 mg total) by mouth 2 (two) times daily.     nitroGLYCERIN 0.4 MG SL tablet  Commonly known as:  NITROSTAT  Place 1 tablet (0.4 mg total) under the tongue every 5 (five) minutes as needed for chest  pain.     omeprazole 40 MG capsule  Commonly known as:  PRILOSEC  Take 1 capsule (40 mg total) by mouth daily.     traMADol 50 MG tablet  Commonly known as:  ULTRAM  Take 50 mg by mouth every 6 (six) hours as needed for moderate pain or severe pain.     Vitamin D 2000 units tablet  Take 2,000 Units by mouth daily.     vitamin E 400 UNIT capsule  Take 400 Units by mouth daily.       Allergies  Allergen Reactions  . Sertraline Hcl     REACTION: heart palpitation       The results of significant diagnostics from this hospitalization (including imaging, microbiology, ancillary and laboratory) are listed below for reference.    Significant Diagnostic Studies: No results found.  Microbiology: Recent Results (from the past 240 hour(s))  Urine culture     Status: None   Collection Time: 04/18/15  6:22 PM  Result Value Ref Range Status   Specimen Description URINE, CATHETERIZED  Final   Special Requests NONE  Final   Culture   Final    NO GROWTH 1 DAY Performed at [facility]    Report Status 04/20/2015 FINAL  Final  Blood culture (routine x 2)     Status: None (Preliminary result)   Collection Time: 04/18/15  6:26 PM  Result Value Ref Range Status   Specimen Description BLOOD LEFT FOREARM  Final   Special Requests BOTTLES DRAWN AEROBIC AND ANAEROBIC 5CC  Final   Culture   Final     NO GROWTH 4 DAYS Performed at [facility]    Report Status PENDING  Incomplete  Blood culture (routine x 2)     Status: None (Preliminary result)   Collection Time: 04/18/15  6:33 PM  Result Value Ref Range Status   Specimen Description LEFT ANTECUBITAL  Final   Special Requests BOTTLES DRAWN AEROBIC AND ANAEROBIC 5CC  Final   Culture   Final    NO GROWTH 4 DAYS Performed at [facility]    Report Status PENDING  Incomplete  MRSA PCR Screening     Status: None   Collection Time: 04/18/15 10:25 PM  Result Value Ref Range Status   MRSA by PCR NEGATIVE NEGATIVE Final    Comment:        The GeneXpert MRSA Assay (FDA approved for NASAL specimens only), is one component of a comprehensive MRSA colonization surveillance program. It is not intended to diagnose MRSA infection nor to guide or monitor treatment for MRSA infections.      Labs: Basic Metabolic Panel:  Recent Labs Lab 04/18/15 1826 04/19/15 0415 04/21/15 0753  NA 143 146* 141  K 4.3 4.5 4.3  CL 109 115* 111  CO2 27 27 25   GLUCOSE 105* 87 78  BUN 22* 18 12  CREATININE 0.91 0.85 0.91  CALCIUM 8.4* 7.8* 7.9*   Liver Function Tests:  Recent Labs Lab 04/18/15 1826  AST 48*  ALT 48  ALKPHOS 67  BILITOT 0.1*  PROT 6.7  ALBUMIN 2.0*   No results for input(s): LIPASE, AMYLASE in the last 168 hours. No results for input(s): AMMONIA in the last 168 hours. CBC:  Recent Labs Lab 04/18/15 1826 04/19/15 0415 04/21/15 0753  WBC 17.9* 16.3* 10.2  NEUTROABS 14.0*  --   --   HGB 11.8* 9.5* 9.4*  HCT 37.5* 29.8* 29.0*  MCV 96.2 96.1 94.5  PLT  210 203 158   Cardiac Enzymes: No results for input(s): CKTOTAL, CKMB, CKMBINDEX, TROPONINI in the last 168 hours. BNP: BNP (last 3 results) No results for input(s): BNP in the last 8760 hours.  ProBNP (last 3 results) No results for input(s): PROBNP in the last 8760 hours.  CBG:  Recent Labs Lab 04/20/15 0911 04/20/15 1021 04/21/15 0739  04/21/15 2030 04/22/15 0740  GLUCAP 62* 81 63* 78 60*       SignedDebbe Odea, MD [facility] 04/23/2015, 9:10 AM

## 2015-04-22 NOTE — Clinical Documentation Improvement (Signed)
Internal Medicine Orthopedic  Please further clarify procedure documentation in the medical record of "debridement."  Thank you   Type - excisional, non-excisional  Other condition  Clinically Undetermined   Supporting Information:   "Bedside debridement of stage III sacral decubitus ulcer performed with removal of black, necrotic tissue. Would continue hydrotherapy, keep him off wound as much as possible."           Please exercise your independent, professional judgment when responding. A specific answer is not anticipated or expected.   Thank You,    [place] [phone]

## 2015-04-22 NOTE — Progress Notes (Signed)
[facility] Progress Note     Subjective: Pt has pain at the site.  Disoriented.  Having formed BM's.  Tolerating some diet.  Objective: Vital signs in last 24 hours: Temp:  [97.8 F (36.6 C)-99.1 F (37.3 C)] 97.8 F (36.6 C) (04/10 1252) Pulse Rate:  [79-93] 93 (04/10 1252) Resp:  [16-18] 16 (04/10 1252) BP: (110-137)/(51-72) 111/55 mmHg (04/10 1252) SpO2:  [97 %-99 %] 98 % (04/10 1252) Last BM Date: 04/20/15  Intake/Output from previous day: 04/09 0701 - 04/10 0700 In: 1830 [P.O.:1680; IV Piggyback:150] Out: [id number] [Urine:1425] Intake/Output this shift: Total I/O In: 360 [P.O.:360] Out: 625 [Urine:625]  PE: Gen:  Alert, NAD, pleasant Sacrum:  9cm x 5cm wound with yellow slough a left aspect of wound around edge.  Rest of wound is beefy red, some greenish drainage on packing (pseudomonas).  Some periwound erythema.  No areas that need surgical debridement.    04/22/15 - Sacral decubitus ulcer 9cm wide   Lab Results:   Recent Labs  04/21/15 0753  WBC 10.2  HGB 9.4*  HCT 29.0*  PLT 158   BMET  Recent Labs  04/21/15 0753  NA 141  K 4.3  CL 111  CO2 25  GLUCOSE 78  BUN 12  CREATININE 0.91  CALCIUM 7.9*   PT/INR No results for input(s): LABPROT, INR in the last 72 hours. CMP     Component Value Date/Time   NA 141 04/21/2015 0753   K 4.3 04/21/2015 0753   CL 111 04/21/2015 0753   CO2 25 04/21/2015 0753   GLUCOSE 78 04/21/2015 0753   GLUCOSE 106* 01/08/2006 1103   BUN 12 04/21/2015 0753   CREATININE 0.91 04/21/2015 0753   CALCIUM 7.9* 04/21/2015 0753   PROT 6.7 04/18/2015 1826   ALBUMIN 2.0* 04/18/2015 1826   AST 48* 04/18/2015 1826   ALT 48 04/18/2015 1826   ALKPHOS 67 04/18/2015 1826   BILITOT 0.1* 04/18/2015 1826   GFRNONAA >60 04/21/2015 0753   GFRAA >60 04/21/2015 0753   Lipase     Component Value Date/Time   LIPASE 31 03/18/2011 1749       Studies/Results: No results found.  Anti-infectives: Anti-infectives      Start     Dose/Rate Route Frequency Ordered Stop   04/19/15 0600  vancomycin (VANCOCIN) 1,250 mg in sodium chloride 0.9 % 250 mL IVPB  Status:  Discontinued     1,250 mg 166.7 mL/hr over 90 Minutes Intravenous Every 12 hours 04/18/15 2021 04/21/15 1423   04/19/15 0200  piperacillin-tazobactam (ZOSYN) IVPB 3.375 g     3.375 g 12.5 mL/hr over 240 Minutes Intravenous Every 8 hours 04/18/15 2022     04/18/15 1800  vancomycin (VANCOCIN) IVPB 1000 mg/200 mL premix     1,000 mg 200 mL/hr over 60 Minutes Intravenous  Once 04/18/15 1723 04/18/15 2147   04/18/15 1730  piperacillin-tazobactam (ZOSYN) IVPB 3.375 g     3.375 g 100 mL/hr over 30 Minutes Intravenous  Once 04/18/15 1721 04/18/15 1910       Assessment/Plan Sacral decubitus ulcer with 10% necrotic tissue  -Continue Hydrotherapy, recommend dressing changes twice a day (once with hydro and one other time)  -WD dressing changes BID  -WBC normal, does not need formal surgical debridement.  We don't think this wound is the source of his confusion.  Will need continued wound care upon discharge.  May benefit from hydro at [facility], but if not available can go with BID WD dressing changes.  -  SNF baseline  -Air mattress, frequent turning to off load wound. -Will follow M/W/F while here.   LOS: 4 days    [name] 04/22/2015, 1:27 PM Pager: [phone]  (7am - 4:30pm M-F; 7am - 11:30am Sa/Su)  Agree with above. There is very little to do except local wound care.  Nicolas Overall, MD, [facility] Pager: [phone] Office phone:  [phone]

## 2015-04-23 LAB — CULTURE, BLOOD (ROUTINE X 2)
CULTURE: NO GROWTH
Culture: NO GROWTH

## 2015-04-23 LAB — VITAMIN B12: VITAMIN B 12: 753 pg/mL (ref 180–914)

## 2015-04-23 MED ORDER — APIXABAN 5 MG PO TABS: 5.0000 mg | ORAL_TABLET | Freq: Two times a day (BID) | ORAL | Status: DC

## 2015-04-23 MED ORDER — APIXABAN 5 MG PO TABS: 10.0000 mg | ORAL_TABLET | Freq: Two times a day (BID) | ORAL | Status: DC

## 2015-04-23 MED ORDER — TRAMADOL HCL 50 MG PO TABS: 50.0000 mg | ORAL_TABLET | Freq: Four times a day (QID) | ORAL | Status: DC | PRN

## 2015-04-23 MED ORDER — LORAZEPAM 0.5 MG PO TABS: 0.5000 mg | ORAL_TABLET | Freq: Two times a day (BID) | ORAL | Status: DC | PRN

## 2015-04-23 NOTE — Progress Notes (Signed)
Discharged to [facility], report given to [facility]. Patients cellphone, charger and eyeglasses sent with patient in belonging bag.

## 2015-04-23 NOTE — Progress Notes (Signed)
Patient was on Eliquis prior to admission for history of A fib.  Patient was found to have new DVT during this admission, and was started on full dose Lovenox while Eliquis was on hold for surgical procedure.  Resumed Eliquis on 4/10 for treatment of DVT. Patient is cognitively impaired - we are unable to counsel on Eliquis.  Will educate family if they are present.  Tonette Bihari, PharmD candidate 04/23/2015

## 2015-04-23 NOTE — Discharge Instructions (Signed)
Information on my medicine - ELIQUIS (apixaban)  This medication education was reviewed with me or my healthcare representative as part of my discharge preparation.    Why was Eliquis prescribed for you? Eliquis was prescribed to treat blood clots that may have been found in the veins of your legs (deep vein thrombosis) or in your lungs (pulmonary embolism) and to reduce the risk of them occurring again.  What do You need to know about Eliquis ? The starting dose is 10 mg (two 5 mg tablets) taken TWICE daily for the FIRST SEVEN (7) DAYS, then on 04/30/15  the dose is reduced to ONE 5 mg tablet taken TWICE daily.  Eliquis may be taken with or without food.   Try to take the dose about the same time in the morning and in the evening. If you have difficulty swallowing the tablet whole please discuss with your pharmacist how to take the medication safely.  Take Eliquis exactly as prescribed and DO NOT stop taking Eliquis without talking to the doctor who prescribed the medication.  Stopping may increase your risk of developing a new blood clot.  Refill your prescription before you run out.  After discharge, you should have regular check-up appointments with your healthcare provider that is prescribing your Eliquis.    What do you do if you miss a dose? If a dose of ELIQUIS is not taken at the scheduled time, take it as soon as possible on the same day and twice-daily administration should be resumed. The dose should not be doubled to make up for a missed dose.  Important Safety Information A possible side effect of Eliquis is bleeding. You should call your healthcare provider right away if you experience any of the following: ? Bleeding from an injury or your nose that does not stop. ? Unusual colored urine (red or dark brown) or unusual colored stools (red or black). ? Unusual bruising for unknown reasons. ? A serious fall or if you hit your head (even if there is no bleeding).  Some  medicines may interact with Eliquis and might increase your risk of bleeding or clotting while on Eliquis. To help avoid this, consult your healthcare provider or pharmacist prior to using any new prescription or non-prescription medications, including herbals, vitamins, non-steroidal anti-inflammatory drugs (NSAIDs) and supplements.  This website has more information on Eliquis (apixaban): http://www.eliquis.com/eliquis/home

## 2015-04-23 NOTE — Clinical Social Work Placement (Signed)
Patient is set to discharge to [facility] today. [employer] authorization obtained. Patient & [name] aware. Discharge packet given to RN, Shirlee Limerick. [facility] called for transport.     Raynaldo Opitz, [facility] Clinical Social Worker cell #: [phone]    CLINICAL SOCIAL WORK PLACEMENT  NOTE  Date:  04/23/2015  Patient Details  Name: [name] MRN: [id number] Date of Birth: 01-26-1940  Clinical Social Work is seeking post-discharge placement for this patient at the [place] level of care (*CSW will initial, date and re-position this form in  chart as items are completed):  Yes   Patient/family provided with [facility] Work Department's list of facilities offering this level of care within the geographic area requested by the patient (or if unable, by the patient's family).  Yes   Patient/family informed of their freedom to choose among providers that offer the needed level of care, that participate in Medicare, Medicaid or managed care program needed by the patient, have an available bed and are willing to accept the patient.  Yes   Patient/family informed of [facility]'s ownership interest in [facility] and [facility], as well as of the fact that they are under no obligation to receive care at these facilities.  PASRR submitted to EDS on 04/19/15     PASRR number received on 04/19/15     Existing PASRR number confirmed on       FL2 transmitted to all facilities in geographic area requested by pt/family on 04/19/15     FL2 transmitted to all facilities within larger geographic area on       Patient informed that his/her managed care company has contracts with or will negotiate with certain facilities, including the following:        Yes   Patient/family informed of bed offers received.  Patient chooses bed at [facility]     Physician recommends and patient chooses  bed at      Patient to be transferred to [facility] on 04/23/15.  Patient to be transferred to facility by [facility]     Patient family notified on 04/23/15 of transfer.  Name of family member notified:  patient's HCPOA, [name]     PHYSICIAN       Additional Comment:    _______________________________________________ Standley Brooking, LCSW 04/23/2015, 4:10 PM

## 2015-04-26 ENCOUNTER — Encounter (HOSPITAL_BASED_OUTPATIENT_CLINIC_OR_DEPARTMENT_OTHER): Payer: PPO | Attending: Internal Medicine

## 2015-05-01 ENCOUNTER — Ambulatory Visit (INDEPENDENT_AMBULATORY_CARE_PROVIDER_SITE_OTHER): Payer: PPO | Admitting: *Deleted

## 2015-05-01 DIAGNOSIS — I639 Cerebral infarction, unspecified: Secondary | ICD-10-CM

## 2015-05-02 NOTE — Progress Notes (Signed)
Carelink Summary Report / Loop Recorder 

## 2015-05-17 LAB — CUP PACEART REMOTE DEVICE CHECK: MDC IDC SESS DTM: 20170219003513

## 2015-05-31 ENCOUNTER — Ambulatory Visit (INDEPENDENT_AMBULATORY_CARE_PROVIDER_SITE_OTHER): Payer: PPO | Admitting: *Deleted

## 2015-05-31 DIAGNOSIS — I639 Cerebral infarction, unspecified: Secondary | ICD-10-CM

## 2015-06-03 NOTE — Progress Notes (Signed)
Carelink Summary Report / Loop Recorder 

## 2015-06-08 LAB — CUP PACEART REMOTE DEVICE CHECK: Date Time Interrogation Session: 20170321004043

## 2015-06-08 NOTE — Progress Notes (Signed)
Carelink summary report received. Battery status OK. Normal device function. No new symptom episodes, tachy episodes, brady, or pause episodes. 3.2% AF , +Eliquis, v rates controlled.  Monthly summary reports and ROV/PRN

## 2015-06-10 LAB — CUP PACEART REMOTE DEVICE CHECK: Date Time Interrogation Session: 20170420010940

## 2015-06-10 NOTE — Progress Notes (Signed)
Carelink summary report received. Battery status OK. Normal device function. No new symptom episodes, tachy episodes, brady, or pause episodes. 1.6% AF, v rates controlled, +Eliquis. Monthly summary reports and ROV/PRN

## 2015-07-01 ENCOUNTER — Ambulatory Visit (INDEPENDENT_AMBULATORY_CARE_PROVIDER_SITE_OTHER): Payer: PPO | Admitting: *Deleted

## 2015-07-01 DIAGNOSIS — I639 Cerebral infarction, unspecified: Secondary | ICD-10-CM

## 2015-07-01 NOTE — Progress Notes (Signed)
Carelink Summary Report / Loop Recorder 

## 2015-07-10 LAB — CUP PACEART REMOTE DEVICE CHECK
Date Time Interrogation Session: 20170520033851
MDC IDC SESS DTM: 20170619013547

## 2015-07-30 ENCOUNTER — Encounter (HOSPITAL_BASED_OUTPATIENT_CLINIC_OR_DEPARTMENT_OTHER): Payer: PPO

## 2015-07-30 ENCOUNTER — Ambulatory Visit (INDEPENDENT_AMBULATORY_CARE_PROVIDER_SITE_OTHER): Payer: PPO | Admitting: *Deleted

## 2015-07-30 DIAGNOSIS — I639 Cerebral infarction, unspecified: Secondary | ICD-10-CM | POA: Diagnosis not present

## 2015-07-31 NOTE — Progress Notes (Signed)
Carelink Summary Report / Loop Recorder 

## 2015-08-14 ENCOUNTER — Encounter (HOSPITAL_BASED_OUTPATIENT_CLINIC_OR_DEPARTMENT_OTHER): Payer: PPO | Attending: Surgery

## 2015-08-14 DIAGNOSIS — L89623 Pressure ulcer of left heel, stage 3: Secondary | ICD-10-CM | POA: Insufficient documentation

## 2015-08-14 DIAGNOSIS — L89524 Pressure ulcer of left ankle, stage 4: Secondary | ICD-10-CM | POA: Diagnosis not present

## 2015-08-14 DIAGNOSIS — L89214 Pressure ulcer of right hip, stage 4: Secondary | ICD-10-CM | POA: Diagnosis not present

## 2015-08-14 DIAGNOSIS — L89153 Pressure ulcer of sacral region, stage 3: Secondary | ICD-10-CM | POA: Insufficient documentation

## 2015-08-14 DIAGNOSIS — I2510 Atherosclerotic heart disease of native coronary artery without angina pectoris: Secondary | ICD-10-CM | POA: Insufficient documentation

## 2015-08-14 DIAGNOSIS — I10 Essential (primary) hypertension: Secondary | ICD-10-CM | POA: Diagnosis not present

## 2015-08-14 DIAGNOSIS — Z86718 Personal history of other venous thrombosis and embolism: Secondary | ICD-10-CM | POA: Insufficient documentation

## 2015-08-22 LAB — CUP PACEART REMOTE DEVICE CHECK: MDC IDC SESS DTM: 20170719013834

## 2015-08-26 ENCOUNTER — Inpatient Hospital Stay (HOSPITAL_COMMUNITY)
Admission: EM | Admit: 2015-08-26 | Discharge: 2015-08-27 | DRG: 871 | Disposition: A | Discharge status: Hospice | Attending: Family Medicine | Admitting: Family Medicine

## 2015-08-26 ENCOUNTER — Encounter (HOSPITAL_COMMUNITY): Payer: Self-pay

## 2015-08-26 ENCOUNTER — Emergency Department (HOSPITAL_COMMUNITY)

## 2015-08-26 DIAGNOSIS — D62 Acute posthemorrhagic anemia: Secondary | ICD-10-CM | POA: Diagnosis present

## 2015-08-26 DIAGNOSIS — I69354 Hemiplegia and hemiparesis following cerebral infarction affecting left non-dominant side: Secondary | ICD-10-CM

## 2015-08-26 DIAGNOSIS — L0889 Other specified local infections of the skin and subcutaneous tissue: Secondary | ICD-10-CM | POA: Diagnosis present

## 2015-08-26 DIAGNOSIS — N400 Enlarged prostate without lower urinary tract symptoms: Secondary | ICD-10-CM | POA: Diagnosis present

## 2015-08-26 DIAGNOSIS — R451 Restlessness and agitation: Secondary | ICD-10-CM | POA: Diagnosis not present

## 2015-08-26 DIAGNOSIS — K219 Gastro-esophageal reflux disease without esophagitis: Secondary | ICD-10-CM | POA: Diagnosis present

## 2015-08-26 DIAGNOSIS — L89159 Pressure ulcer of sacral region, unspecified stage: Secondary | ICD-10-CM | POA: Diagnosis present

## 2015-08-26 DIAGNOSIS — Z85828 Personal history of other malignant neoplasm of skin: Secondary | ICD-10-CM

## 2015-08-26 DIAGNOSIS — F0390 Unspecified dementia without behavioral disturbance: Secondary | ICD-10-CM | POA: Diagnosis present

## 2015-08-26 DIAGNOSIS — A4159 Other Gram-negative sepsis: Principal | ICD-10-CM | POA: Diagnosis present

## 2015-08-26 DIAGNOSIS — F17200 Nicotine dependence, unspecified, uncomplicated: Secondary | ICD-10-CM | POA: Diagnosis present

## 2015-08-26 DIAGNOSIS — B961 Klebsiella pneumoniae [K. pneumoniae] as the cause of diseases classified elsewhere: Secondary | ICD-10-CM | POA: Diagnosis present

## 2015-08-26 DIAGNOSIS — I480 Paroxysmal atrial fibrillation: Secondary | ICD-10-CM | POA: Diagnosis present

## 2015-08-26 DIAGNOSIS — B964 Proteus (mirabilis) (morganii) as the cause of diseases classified elsewhere: Secondary | ICD-10-CM | POA: Diagnosis present

## 2015-08-26 DIAGNOSIS — L89304 Pressure ulcer of unspecified buttock, stage 4: Secondary | ICD-10-CM | POA: Diagnosis present

## 2015-08-26 DIAGNOSIS — F329 Major depressive disorder, single episode, unspecified: Secondary | ICD-10-CM | POA: Diagnosis present

## 2015-08-26 DIAGNOSIS — G9340 Encephalopathy, unspecified: Secondary | ICD-10-CM | POA: Diagnosis present

## 2015-08-26 DIAGNOSIS — Z801 Family history of malignant neoplasm of trachea, bronchus and lung: Secondary | ICD-10-CM

## 2015-08-26 DIAGNOSIS — T148 Other injury of unspecified body region: Secondary | ICD-10-CM | POA: Diagnosis not present

## 2015-08-26 DIAGNOSIS — I10 Essential (primary) hypertension: Secondary | ICD-10-CM | POA: Diagnosis present

## 2015-08-26 DIAGNOSIS — E785 Hyperlipidemia, unspecified: Secondary | ICD-10-CM | POA: Diagnosis present

## 2015-08-26 DIAGNOSIS — I959 Hypotension, unspecified: Secondary | ICD-10-CM | POA: Diagnosis present

## 2015-08-26 DIAGNOSIS — A419 Sepsis, unspecified organism: Secondary | ICD-10-CM

## 2015-08-26 DIAGNOSIS — T148XXA Other injury of unspecified body region, initial encounter: Secondary | ICD-10-CM

## 2015-08-26 DIAGNOSIS — N179 Acute kidney failure, unspecified: Secondary | ICD-10-CM | POA: Diagnosis present

## 2015-08-26 DIAGNOSIS — Z66 Do not resuscitate: Secondary | ICD-10-CM | POA: Diagnosis present

## 2015-08-26 DIAGNOSIS — F419 Anxiety disorder, unspecified: Secondary | ICD-10-CM | POA: Diagnosis present

## 2015-08-26 DIAGNOSIS — L89309 Pressure ulcer of unspecified buttock, unspecified stage: Secondary | ICD-10-CM | POA: Diagnosis present

## 2015-08-26 DIAGNOSIS — I2510 Atherosclerotic heart disease of native coronary artery without angina pectoris: Secondary | ICD-10-CM | POA: Diagnosis present

## 2015-08-26 DIAGNOSIS — Z7401 Bed confinement status: Secondary | ICD-10-CM | POA: Diagnosis not present

## 2015-08-26 DIAGNOSIS — Z79891 Long term (current) use of opiate analgesic: Secondary | ICD-10-CM

## 2015-08-26 DIAGNOSIS — Z823 Family history of stroke: Secondary | ICD-10-CM

## 2015-08-26 DIAGNOSIS — Z7189 Other specified counseling: Secondary | ICD-10-CM

## 2015-08-26 DIAGNOSIS — L89319 Pressure ulcer of right buttock, unspecified stage: Secondary | ICD-10-CM

## 2015-08-26 DIAGNOSIS — B9689 Other specified bacterial agents as the cause of diseases classified elsewhere: Secondary | ICD-10-CM | POA: Diagnosis present

## 2015-08-26 DIAGNOSIS — R52 Pain, unspecified: Secondary | ICD-10-CM | POA: Diagnosis not present

## 2015-08-26 DIAGNOSIS — Z515 Encounter for palliative care: Secondary | ICD-10-CM | POA: Diagnosis not present

## 2015-08-26 DIAGNOSIS — Z7901 Long term (current) use of anticoagulants: Secondary | ICD-10-CM

## 2015-08-26 DIAGNOSIS — Z888 Allergy status to other drugs, medicaments and biological substances status: Secondary | ICD-10-CM

## 2015-08-26 DIAGNOSIS — Z833 Family history of diabetes mellitus: Secondary | ICD-10-CM

## 2015-08-26 DIAGNOSIS — Z951 Presence of aortocoronary bypass graft: Secondary | ICD-10-CM

## 2015-08-26 DIAGNOSIS — Z8249 Family history of ischemic heart disease and other diseases of the circulatory system: Secondary | ICD-10-CM

## 2015-08-26 DIAGNOSIS — Z79899 Other long term (current) drug therapy: Secondary | ICD-10-CM

## 2015-08-26 DIAGNOSIS — L089 Local infection of the skin and subcutaneous tissue, unspecified: Secondary | ICD-10-CM

## 2015-08-26 DIAGNOSIS — I63511 Cerebral infarction due to unspecified occlusion or stenosis of right middle cerebral artery: Secondary | ICD-10-CM

## 2015-08-26 LAB — BASIC METABOLIC PANEL
ANION GAP: 7 (ref 5–15)
BUN: 28 mg/dL — ABNORMAL HIGH (ref 6–20)
CHLORIDE: 102 mmol/L (ref 101–111)
CO2: 23 mmol/L (ref 22–32)
Calcium: 7.7 mg/dL — ABNORMAL LOW (ref 8.9–10.3)
Creatinine, Ser: 1.86 mg/dL — ABNORMAL HIGH (ref 0.61–1.24)
GFR calc non Af Amer: 34 mL/min — ABNORMAL LOW (ref >60)
GFR, EST AFRICAN AMERICAN: 40 mL/min — AB (ref >60)
Glucose, Bld: 59 mg/dL — ABNORMAL LOW (ref 65–99)
POTASSIUM: 5.5 mmol/L — AB (ref 3.5–5.1)
Sodium: 132 mmol/L — ABNORMAL LOW (ref 135–145)

## 2015-08-26 LAB — MRSA PCR SCREENING: MRSA by PCR: NEGATIVE

## 2015-08-26 LAB — PROTIME-INR
INR: 2.47
Prothrombin Time: 27.2 seconds — ABNORMAL HIGH (ref 11.4–15.2)

## 2015-08-26 LAB — BLOOD CULTURE ID PANEL (REFLEXED)
Acinetobacter baumannii: NOT DETECTED
CANDIDA PARAPSILOSIS: NOT DETECTED
CANDIDA TROPICALIS: NOT DETECTED
Candida albicans: NOT DETECTED
Candida glabrata: NOT DETECTED
Candida krusei: NOT DETECTED
Carbapenem resistance: NOT DETECTED
ENTEROCOCCUS SPECIES: NOT DETECTED
Enterobacter cloacae complex: NOT DETECTED
Enterobacteriaceae species: DETECTED — AB
Escherichia coli: NOT DETECTED
HAEMOPHILUS INFLUENZAE: NOT DETECTED
Klebsiella oxytoca: NOT DETECTED
Klebsiella pneumoniae: DETECTED — AB
LISTERIA MONOCYTOGENES: NOT DETECTED
METHICILLIN RESISTANCE: NOT DETECTED
Neisseria meningitidis: NOT DETECTED
PROTEUS SPECIES: DETECTED — AB
Pseudomonas aeruginosa: NOT DETECTED
SERRATIA MARCESCENS: NOT DETECTED
STAPHYLOCOCCUS AUREUS BCID: NOT DETECTED
STREPTOCOCCUS PYOGENES: NOT DETECTED
Staphylococcus species: NOT DETECTED
Streptococcus agalactiae: NOT DETECTED
Streptococcus pneumoniae: NOT DETECTED
Streptococcus species: NOT DETECTED
Vancomycin resistance: NOT DETECTED

## 2015-08-26 LAB — CBC WITH DIFFERENTIAL/PLATELET
BASOS PCT: 0 %
Basophils Absolute: 0 10*3/uL (ref 0.0–0.1)
EOS PCT: 0 %
Eosinophils Absolute: 0 10*3/uL (ref 0.0–0.7)
HCT: 21.1 % — ABNORMAL LOW (ref 39.0–52.0)
HEMOGLOBIN: 6.6 g/dL — AB (ref 13.0–17.0)
LYMPHS PCT: 8 %
Lymphs Abs: 1.9 10*3/uL (ref 0.7–4.0)
MCH: 29.7 pg (ref 26.0–34.0)
MCHC: 31.3 g/dL (ref 30.0–36.0)
MCV: 95 fL (ref 78.0–100.0)
MONOS PCT: 8 %
Monocytes Absolute: 1.9 10*3/uL — ABNORMAL HIGH (ref 0.1–1.0)
NEUTROS PCT: 84 %
Neutro Abs: 20.2 10*3/uL — ABNORMAL HIGH (ref 1.7–7.7)
Platelets: 338 10*3/uL (ref 150–400)
RBC: 2.22 MIL/uL — ABNORMAL LOW (ref 4.22–5.81)
RDW: 17.7 % — ABNORMAL HIGH (ref 11.5–15.5)
WBC: 24 10*3/uL — ABNORMAL HIGH (ref 4.0–10.5)

## 2015-08-26 LAB — URINE MICROSCOPIC-ADD ON

## 2015-08-26 LAB — URINALYSIS, ROUTINE W REFLEX MICROSCOPIC
Bilirubin Urine: NEGATIVE
GLUCOSE, UA: NEGATIVE mg/dL
KETONES UR: NEGATIVE mg/dL
Nitrite: POSITIVE — AB
PH: 6.5 (ref 5.0–8.0)
Protein, ur: 100 mg/dL — AB
SPECIFIC GRAVITY, URINE: 1.009 (ref 1.005–1.030)

## 2015-08-26 LAB — ALBUMIN: ALBUMIN: 1.4 g/dL — AB (ref 3.5–5.0)

## 2015-08-26 LAB — PROCALCITONIN: Procalcitonin: 0.74 ng/mL

## 2015-08-26 LAB — APTT: APTT: 45 s — AB (ref 24–36)

## 2015-08-26 LAB — PREPARE RBC (CROSSMATCH)

## 2015-08-26 LAB — LACTIC ACID, PLASMA: LACTIC ACID, VENOUS: 3.1 mmol/L — AB (ref 0.5–1.9)

## 2015-08-26 LAB — ABO/RH: ABO/RH(D): B POS

## 2015-08-26 MED ORDER — ACETAMINOPHEN 325 MG PO TABS: 650.0000 mg | ORAL_TABLET | Freq: Four times a day (QID) | ORAL | Status: DC | PRN

## 2015-08-26 MED ORDER — HALOPERIDOL LACTATE 5 MG/ML IJ SOLN
1.0000 mg | Freq: Three times a day (TID) | INTRAMUSCULAR | Status: DC
  Administered 2015-08-27: 1 mg via INTRAVENOUS
  Filled 2015-08-26: qty 1

## 2015-08-26 MED ORDER — POLYVINYL ALCOHOL 1.4 % OP SOLN
1.0000 [drp] | Freq: Four times a day (QID) | OPHTHALMIC | Status: DC | PRN
  Filled 2015-08-26: qty 15

## 2015-08-26 MED ORDER — PANTOPRAZOLE SODIUM 40 MG PO TBEC: 40.0000 mg | DELAYED_RELEASE_TABLET | Freq: Every day | ORAL | Status: DC

## 2015-08-26 MED ORDER — ONDANSETRON HCL 4 MG/2ML IJ SOLN: 4.0000 mg | Freq: Four times a day (QID) | INTRAMUSCULAR | Status: DC | PRN

## 2015-08-26 MED ORDER — NITROGLYCERIN 0.4 MG SL SUBL: 0.4000 mg | SUBLINGUAL_TABLET | SUBLINGUAL | Status: DC | PRN

## 2015-08-26 MED ORDER — MORPHINE SULFATE (PF) 2 MG/ML IV SOLN
2.0000 mg | INTRAVENOUS | Status: DC
  Administered 2015-08-26 – 2015-08-27 (×7): 2 mg via INTRAVENOUS
  Filled 2015-08-26 (×7): qty 1

## 2015-08-26 MED ORDER — PIPERACILLIN-TAZOBACTAM 3.375 G IVPB 30 MIN
3.3750 g | Freq: Once | INTRAVENOUS | Status: AC
  Administered 2015-08-26: 3.375 g via INTRAVENOUS
  Filled 2015-08-26: qty 50

## 2015-08-26 MED ORDER — HALOPERIDOL LACTATE 2 MG/ML PO CONC
0.5000 mg | ORAL | Status: DC | PRN
Start: 2015-08-26 — End: 2015-08-26
  Filled 2015-08-26: qty 0.3

## 2015-08-26 MED ORDER — DEXTROSE 50 % IV SOLN: 50.0000 mL | INTRAVENOUS | Status: DC | PRN

## 2015-08-26 MED ORDER — SODIUM CHLORIDE 0.9 % IV BOLUS (SEPSIS)
1000.0000 mL | Freq: Once | INTRAVENOUS | Status: AC
  Administered 2015-08-26: 1000 mL via INTRAVENOUS

## 2015-08-26 MED ORDER — HALOPERIDOL LACTATE 2 MG/ML PO CONC
0.5000 mg | Freq: Three times a day (TID) | ORAL | Status: DC
  Filled 2015-08-26 (×5): qty 0.3

## 2015-08-26 MED ORDER — BISACODYL 10 MG RE SUPP: 10.0000 mg | Freq: Every day | RECTAL | Status: DC | PRN

## 2015-08-26 MED ORDER — GLYCOPYRROLATE 0.2 MG/ML IJ SOLN
0.2000 mg | INTRAMUSCULAR | Status: DC | PRN
  Filled 2015-08-26: qty 1

## 2015-08-26 MED ORDER — HALOPERIDOL LACTATE 5 MG/ML IJ SOLN: 0.5000 mg | INTRAMUSCULAR | Status: DC | PRN

## 2015-08-26 MED ORDER — LORAZEPAM 1 MG PO TABS: 1.0000 mg | ORAL_TABLET | ORAL | Status: DC | PRN

## 2015-08-26 MED ORDER — GLYCOPYRROLATE 1 MG PO TABS
1.0000 mg | ORAL_TABLET | ORAL | Status: DC | PRN
  Filled 2015-08-26: qty 1

## 2015-08-26 MED ORDER — LORAZEPAM 2 MG/ML PO CONC: 1.0000 mg | ORAL | Status: DC | PRN

## 2015-08-26 MED ORDER — SODIUM CHLORIDE 0.9 % IV BOLUS (SEPSIS)
1500.0000 mL | Freq: Once | INTRAVENOUS | Status: AC
Start: 2015-08-26 — End: 2015-08-26
  Administered 2015-08-26: 1500 mL via INTRAVENOUS

## 2015-08-26 MED ORDER — HALOPERIDOL 0.5 MG PO TABS
0.5000 mg | ORAL_TABLET | Freq: Three times a day (TID) | ORAL | Status: DC
  Filled 2015-08-26 (×5): qty 1

## 2015-08-26 MED ORDER — ONDANSETRON 4 MG PO TBDP: 4.0000 mg | ORAL_TABLET | Freq: Four times a day (QID) | ORAL | Status: DC | PRN

## 2015-08-26 MED ORDER — ALBUTEROL SULFATE (2.5 MG/3ML) 0.083% IN NEBU: 2.5000 mg | INHALATION_SOLUTION | RESPIRATORY_TRACT | Status: DC | PRN

## 2015-08-26 MED ORDER — SODIUM CHLORIDE 0.9% FLUSH
3.0000 mL | Freq: Two times a day (BID) | INTRAVENOUS | Status: DC
  Administered 2015-08-26 (×2): 3 mL via INTRAVENOUS

## 2015-08-26 MED ORDER — ACETAMINOPHEN 650 MG RE SUPP: 650.0000 mg | Freq: Four times a day (QID) | RECTAL | Status: DC | PRN

## 2015-08-26 MED ORDER — SODIUM CHLORIDE 0.9% FLUSH: 3.0000 mL | INTRAVENOUS | Status: DC | PRN

## 2015-08-26 MED ORDER — VANCOMYCIN HCL IN DEXTROSE 1-5 GM/200ML-% IV SOLN
1000.0000 mg | Freq: Once | INTRAVENOUS | Status: DC
  Administered 2015-08-26: 1000 mg via INTRAVENOUS
  Filled 2015-08-26: qty 200

## 2015-08-26 MED ORDER — SENNA 8.6 MG PO TABS: 1.0000 | ORAL_TABLET | Freq: Every evening | ORAL | Status: DC | PRN

## 2015-08-26 MED ORDER — SODIUM CHLORIDE 0.9 % IV SOLN: 250.0000 mL | INTRAVENOUS | Status: DC | PRN

## 2015-08-26 MED ORDER — SODIUM CHLORIDE 0.9 % IV SOLN: 10.0000 mL/h | Freq: Once | INTRAVENOUS | Status: DC

## 2015-08-26 MED ORDER — LORAZEPAM 2 MG/ML IJ SOLN: 1.0000 mg | INTRAMUSCULAR | Status: DC | PRN

## 2015-08-26 MED ORDER — MORPHINE SULFATE (CONCENTRATE) 10 MG/0.5ML PO SOLN: 5.0000 mg | Freq: Four times a day (QID) | ORAL | Status: DC | PRN

## 2015-08-26 MED ORDER — MORPHINE SULFATE (PF) 2 MG/ML IV SOLN: 1.0000 mg | INTRAVENOUS | Status: DC | PRN

## 2015-08-26 MED ORDER — BIOTENE DRY MOUTH MT LIQD: 15.0000 mL | OROMUCOSAL | Status: DC | PRN

## 2015-08-26 MED ORDER — HALOPERIDOL 1 MG PO TABS
0.5000 mg | ORAL_TABLET | ORAL | Status: DC | PRN
  Filled 2015-08-26: qty 0.5

## 2015-08-26 NOTE — Progress Notes (Signed)
PHARMACY - PHYSICIAN COMMUNICATION CRITICAL VALUE ALERT - BLOOD CULTURE IDENTIFICATION (BCID)  Results for orders placed or performed during the hospital encounter of 08/26/15  Blood Culture ID Panel (Reflexed) (Collected: 08/26/2015  6:00 AM)  Result Value Ref Range   Enterococcus species NOT DETECTED NOT DETECTED   Vancomycin resistance NOT DETECTED NOT DETECTED   Listeria monocytogenes NOT DETECTED NOT DETECTED   Staphylococcus species NOT DETECTED NOT DETECTED   Staphylococcus aureus NOT DETECTED NOT DETECTED   Methicillin resistance NOT DETECTED NOT DETECTED   Streptococcus species NOT DETECTED NOT DETECTED   Streptococcus agalactiae NOT DETECTED NOT DETECTED   Streptococcus pneumoniae NOT DETECTED NOT DETECTED   Streptococcus pyogenes NOT DETECTED NOT DETECTED   Acinetobacter baumannii NOT DETECTED NOT DETECTED   Enterobacteriaceae species DETECTED (A) NOT DETECTED   Enterobacter cloacae complex NOT DETECTED NOT DETECTED   Escherichia coli NOT DETECTED NOT DETECTED   Klebsiella oxytoca NOT DETECTED NOT DETECTED   Klebsiella pneumoniae DETECTED (A) NOT DETECTED   Proteus species DETECTED (A) NOT DETECTED   Serratia marcescens NOT DETECTED NOT DETECTED   Carbapenem resistance NOT DETECTED NOT DETECTED   Haemophilus influenzae NOT DETECTED NOT DETECTED   Neisseria meningitidis NOT DETECTED NOT DETECTED   Pseudomonas aeruginosa NOT DETECTED NOT DETECTED   Candida albicans NOT DETECTED NOT DETECTED   Candida glabrata NOT DETECTED NOT DETECTED   Candida krusei NOT DETECTED NOT DETECTED   Candida parapsilosis NOT DETECTED NOT DETECTED   Candida tropicalis NOT DETECTED NOT DETECTED    Name of physician (or Provider) Contacted: Forrest Moron, NP  Changes to prescribed antibiotics required: No antibiotics at this time per MD note today for palliative care, which states no IV antibiotics.  Everette Rank, PharmD 08/26/2015  11:37 PM

## 2015-08-26 NOTE — H&P (Signed)
TRH H&P   Patient Demographics:    Amber Schillig, is a 75 y.o. male  MRN: [id number]   DOB - 1940-11-11  Admit Date - 08/26/2015  Outpatient Primary MD for the patient is Kathlene November, MD  Patient coming from: SNF with hospice  Chief Complaint  Patient presents with  . Open Wound      HPI:    Hassel Tetterton  is a 75 y.o. male, History of stroke causing left-sided hemiparesis, bedbound status, multiple sacral decubitus ulcers, DVT on Eliquis, paroxysmal atrial fibrillation with [place]i vasc 2 score of 5, status SNF hospice who was brought in from the nursing home with open wounds on both hips, right wound bleeding, left wound infected. Patient is not verbal appears to be his baseline due to combination of stroke and encephalopathy with underlying questionable dementia.  In the ER he was found to be anemic due to acute blood loss from his right hip wound, sepsis due to left hip wound infection and I was called to admit. Patient is DO NOT RESUSCITATE and was hospice at baseline.    Review of systems:    Patient nonverbal due to encephalopathy and stroke  With Past History of the following :    Past Medical History:  Diagnosis Date  . Anal fissure   . Anal warts   . Anxiety and depression 02/14/2009   Qualifier: Diagnosis of  By: Larose Kells MD, Story BPH (benign prostatic hyperplasia)    s/p surgery  . Carotid arterial disease ([place])    s/p RCEA 11/2008 (Note: hx of negative cardiac nuc 03/2009)  . Colonic polyp    cscopes 08/18/05 tics,polyps.Marland Kitchenall hyperplastic  . Depression   . GERD (gastroesophageal reflux disease)    [facility]. (per bx 08/2005) and HH f/u Dr.Schooler  . H/O ETOH abuse    Former. Quit ~2012   . Hemorrhoids   . Hypertension   . Osteoarthritis   . Panic attack   . Skin cancer    squamous cell, L side of Face; s/p procedule 01/30/08      Past Surgical History:  Procedure Laterality Date  . APPENDECTOMY    . CATARACT EXTRACTION     wears contact lens L, good vision  . CORONARY ARTERY BYPASS GRAFT  03/23/2011   Procedure: CORONARY ARTERY BYPASS GRAFTING (  CABG);  Surgeon: Melrose Nakayama, MD;  Location: [facility];  Service: Open Heart Surgery;  Laterality: N/A;  CABG x four using bilateral greater saphenous vein, harvested endoscopically  . EP IMPLANTABLE DEVICE N/A 01/01/2015   Procedure: Loop Recorder Insertion;  Surgeon: Broecker Grayer, MD;  Location: [facility];  Service: Cardiovascular;  Laterality: N/A;  . FOOT SURGERY     fx  . KNEE SURGERY    . LEFT HEART CATHETERIZATION WITH CORONARY ANGIOGRAM N/A 03/19/2011   Procedure: LEFT HEART CATHETERIZATION WITH CORONARY ANGIOGRAM;  Surgeon: Thayer Headings, MD;  Location: [facility] CATH LAB;  Service: Cardiovascular;  Laterality: N/A;  . Right Carotid Endartectomy with Dacron Patch angioplasty  11/2008  . skin tags excised    . TEE WITHOUT CARDIOVERSION N/A 01/01/2015   Procedure: TRANSESOPHAGEAL ECHOCARDIOGRAM (TEE) WITH A LOOP;  Surgeon: Pixie Casino, MD;  Location: [facility];  Service: Cardiovascular;  Laterality: N/A;  . TOTAL HIP ARTHROPLASTY  2005   R due to OA  . TRANSURETHRAL RESECTION OF PROSTATE        Social History:     Social History  Substance Use Topics  . Smoking status: Current Some Day Smoker    Packs/day: 1.00    Years: 52.00    Types: Cigarettes  . Smokeless tobacco: Never Used     Comment: < 1 ppd   . Alcohol use No     Comment: currently not drinking, former abuse          Family History :     Family History  Problem Relation Age of Onset  . Lung cancer Father     mets to brain- deceased  . Arthritis Mother   . Hypertension Mother   . Diabetes Paternal Aunt   . Stroke Paternal  Grandfather   . Coronary artery disease Neg Hx   . Colon cancer Neg Hx   . Prostate cancer Neg Hx        Home Medications:   Prior to Admission medications   Medication Sig Start Date End Date Taking? Authorizing Provider  acetaminophen (TYLENOL) 325 MG tablet Take 650 mg by mouth every 4 (four) hours as needed (for fever 100F or above.).   Yes Historical Provider, MD  Amino Acids-Protein Hydrolys (FEEDING SUPPLEMENT, PRO-STAT SUGAR FREE 64,) LIQD Take 30 mLs by mouth 3 (three) times daily. Patient taking differently: Take 30 mLs by mouth 2 (two) times daily.  04/22/15  Yes Debbe Odea, MD  apixaban (ELIQUIS) 5 MG TABS tablet Take 1 tablet (5 mg total) by mouth 2 (two) times daily. 04/30/15  Yes Debbe Odea, MD  citalopram (CELEXA) 20 MG tablet Take 20 mg by mouth daily.   Yes Historical Provider, MD  collagenase (SANTYL) ointment Apply 1 application topically daily. Applied to left ankle topically daily for wound care, cover with foam dressing   Yes Historical Provider, MD  dextrose 5 % solution Inject 75 mLs into the vein as directed. 51ml/hr intravenously every shift for supplement   Yes Historical Provider, MD  divalproex (DEPAKOTE) 250 MG DR tablet Take 250 mg by mouth 2 (two) times daily.   Yes Historical Provider, MD  LORazepam (ATIVAN) 0.5 MG tablet Take 1 tablet (0.5 mg total) by mouth 2 (two) times daily as needed for anxiety. 04/23/15  Yes Debbe Odea, MD  LORazepam (ATIVAN) 2 MG/ML concentrated solution Take 1 mg by mouth every 4 (four) hours as needed (aggitation/anxiety).   Yes Historical Provider, MD  metoprolol (LOPRESSOR)  50 MG tablet Take 1 tablet (50 mg total) by mouth 2 (two) times daily. 06/28/14  Yes Minus Breeding, MD  morphine (ROXANOL) 20 MG/ML concentrated solution Take 5 mg by mouth every 6 (six) hours as needed for severe pain.   Yes Historical Provider, MD  nitroGLYCERIN (NITROSTAT) 0.4 MG SL tablet Place 1 tablet (0.4 mg total) under the tongue every 5 (five)  minutes as needed for chest pain. 12/31/11  Yes Minus Breeding, MD  NUTRITIONAL SUPPLEMENT LIQD Take 120 mLs by mouth 3 (three) times daily. Med Pass 2.0 (to promote weight gain)   Yes Historical Provider, MD  oxyCODONE-acetaminophen (PERCOCET/ROXICET) 5-325 MG tablet Take 1 tablet by mouth every 12 (twelve) hours.   Yes Historical Provider, MD  potassium chloride SA (KLOR-CON M20) 20 MEQ tablet Take 40 mEq by mouth daily.   Yes Historical Provider, MD  sodium hypochlorite (DAKIN'S 1/4 STRENGTH) 0.125 % SOLN Irrigate with 1 application as directed as directed. Applied to left and right hip, and right thigh daily for wound care, cover with dry dressing   Yes Historical Provider, MD  traMADol (ULTRAM) 50 MG tablet Take 1 tablet (50 mg total) by mouth every 6 (six) hours as needed for moderate pain or severe pain. 04/23/15  Yes Debbe Odea, MD  amoxicillin-clavulanate (AUGMENTIN) 875-125 MG tablet Take 1 tablet by mouth every 12 (twelve) hours. Patient not taking: Reported on 08/26/2015 04/22/15   Debbe Odea, MD  apixaban (ELIQUIS) 5 MG TABS tablet Take 2 tablets (10 mg total) by mouth 2 (two) times daily. Patient not taking: Reported on 08/26/2015 04/23/15 08/25/16  Debbe Odea, MD  omeprazole (PRILOSEC) 40 MG capsule Take 1 capsule (40 mg total) by mouth daily. Patient not taking: Reported on 08/26/2015 08/03/14   Colon Branch, MD     Allergies:     Allergies  Allergen Reactions  . Sertraline Hcl     REACTION: heart palpitation      Physical Exam:   Vitals  Blood pressure (!) 95/50, pulse 83, temperature 97.3 F (36.3 C), temperature source Oral, resp. rate 16, SpO2 100 %.   1. General Frail elderly white male lying in hospital bed nonverbal and moaning,  2. Cannot answer questions or follow commands, has chronic left-sided contractors, wearing leg splints on both legs  3. Chronic left-sided contractors and hemiparesis.  4. Ears and Eyes appear Normal, Conjunctivae clear, PERRLA. Moist  Oral Mucosa.  5. Supple Neck, No JVD, No cervical lymphadenopathy appriciated, No Carotid Bruits.  6. Symmetrical Chest wall movement, Good air movement bilaterally, CTAB.  7. RRR, No Gallops, Rubs or Murmurs, No Parasternal Heave.  8. Positive Bowel Sounds, Abdomen Soft, No tenderness, No organomegaly appriciated,No rebound -guarding or rigidity.  9.  No Cyanosis, Normal Skin Turgor, No Skin Rash or Bruise. 2 stage IV sacral decubitus ulcers on both hips as below  10. Good muscle tone,  joints appear normal , no effusions, Normal ROM.  11. No Palpable Lymph Nodes in Neck or Axillae  L. hip      R. Hip        Data Review:    CBC  Recent Labs Lab 08/26/15 0502  WBC 24.0*  HGB 6.6*  HCT 21.1*  PLT 338  MCV 95.0  MCH 29.7  MCHC 31.3  RDW 17.7*  LYMPHSABS 1.9  MONOABS 1.9*  EOSABS 0.0  BASOSABS 0.0   ------------------------------------------------------------------------------------------------------------------  Chemistries   Recent Labs Lab 08/26/15 0502  NA 132*  K 5.5*  CL 102  CO2 23  GLUCOSE 59*  BUN 28*  CREATININE 1.86*  CALCIUM 7.7*   ------------------------------------------------------------------------------------------------------------------ CrCl cannot be calculated (Unknown ideal weight.). ------------------------------------------------------------------------------------------------------------------ No results for input(s): TSH, T4TOTAL, T3FREE, THYROIDAB in the last 72 hours.  Invalid input(s): FREET3  Coagulation profile  Recent Labs Lab 08/26/15 0601  INR 2.47   ------------------------------------------------------------------------------------------------------------------- No results for input(s): DDIMER in the last 72 hours. -------------------------------------------------------------------------------------------------------------------  Cardiac Enzymes No results for input(s): CKMB, TROPONINI, MYOGLOBIN in  the last 168 hours.  Invalid input(s): CK ------------------------------------------------------------------------------------------------------------------ No results found for: BNP   ---------------------------------------------------------------------------------------------------------------  Urinalysis    Component Value Date/Time   COLORURINE YELLOW 04/18/2015 1822   APPEARANCEUR HAZY (A) 04/18/2015 1822   LABSPEC 1.028 04/18/2015 1822   PHURINE 7.0 04/18/2015 1822   GLUCOSEU NEGATIVE 04/18/2015 1822   HGBUR NEGATIVE 04/18/2015 1822   BILIRUBINUR NEGATIVE 04/18/2015 1822   KETONESUR NEGATIVE 04/18/2015 1822   PROTEINUR NEGATIVE 04/18/2015 1822   UROBILINOGEN 0.2 03/18/2011 1824   NITRITE NEGATIVE 04/18/2015 1822   LEUKOCYTESUR NEGATIVE 04/18/2015 1822    ----------------------------------------------------------------------------------------------------------------   Imaging Results:    Dg Chest Port 1 View  Result Date: 08/26/2015 CLINICAL DATA:  Initial evaluation for leukocytosis. History dementia. EXAM: PORTABLE CHEST 1 VIEW COMPARISON:  Prior radiograph from 05/19/2013. FINDINGS: Median sternotomy wires underlying surgical clips and CABG markers present. Right-sided PICC catheter in place with tip overlying the proximal SVC. Heart size is stable. Mediastinal silhouette within normal limits. Lungs are normally inflated. No focal infiltrate, pulmonary edema, or pleural effusion. No pneumothorax. No acute osseous abnormality. IMPRESSION: 1. No active cardiopulmonary disease. 2. Sequela of prior CABG. Electronically Signed   By: Jeannine Boga M.D.   On: 08/26/2015 05:58    My personal review of EKG: Rhythm NSR, Rate  82/min,   no Acute ST changes   Assessment & Plan:     1. Sepsis in a unfortunate gentleman with bilateral stage IV buttock decubitus ulcers. Left wound infected. Discussed with POA, patient has very poor quality of life, was hospice at baseline,  will stop all aggressive measures and initiate comfort protocol. Palliative care has been consulted. DO NOT RESUSCITATE. No heroics.  2. Acute blood loss anemia due to right stage IV decubitus ulcer bleeding in the presence of Eliquis. Hold Eliquis. Comfort Care only.  3. Paroxysmal atrial fibrillation Mali vasc 2 score of 5. Supportive care only.  4. Right MCA CVA with left-sided hemiparesis. Bedbound status. Extremely poor quality of life, Comfort Care now.  5. History of hypertension, dyslipidemia, CAD, possible underlying dementia. Supportive care only.   DVT Prophylaxis None  AM Labs Ordered, also please review Full Orders  Family Communication: Admission, patients condition and plan of care including tests being ordered have been discussed with the patient and POA who indicate understanding and agree with the plan and Code Status.  Code Status DNR  [facility]  Condition GUARDED    Consults called: Pall Care    Admission status: Inpt    Time spent in minutes : 35   Lala Lund K M.D on 08/26/2015 at 7:35 AM  Between 7am to 7pm - Pager - [phone]. After 7pm go to [email or web address] - password [email or web address]  [facility] - Office  [phone]

## 2015-08-26 NOTE — Progress Notes (Signed)
pharmD called + blood culture results. In review of chart, palliative consult was done today and HCPOA elected comfort care only (including no IV antibiotics among other things). Therefore, will not treat.

## 2015-08-26 NOTE — Progress Notes (Signed)
Pt arrived to floor, room 1508, from ED. Patient found to have a total of 6 pressure ulcers.   There is a stage 4 on his right, lateral hip that is actively bleeding and has several large clots present. Only gauze was covering so I packed it with saline-soaked kerlex and covered with dry gauze and abd pads. Kerlex chosen for all wounds that needed packing to better assist next RN with removing all dressing, seeing as wounds are so large/deep.   On the left hip there is a large pressure ulcer, stage 3 or 4, with malodorous, purulent drainage present. This was packed in similar fashion to the right hip.   Also a stage 4 on sacrum. Previous dressing was soaked in serosanguinous fluid. New dressing of saline-soaked kerlex packed and covered with dry gauze and abd as well.   On the medial side of right thigh there is a small hole-like ulcer. I placed a small piece of saline-soaked gauze here and covered with a dry dressing. No drainage noted.   On left foot there are two ulcers, one on the lateral side and one on the medial side of the foot. Both were cleansed, covered with petroleum-impregnanted gauze, and wrapped with kerlex.   Patient unable to communicate but will moan and attempt to speak. Foley was placed and is draining thick, cloudy (almost milky) urine, and patient had a small, soft, BM.  Patient tolerated fairy well and resting comfortably now on air mattress.

## 2015-08-26 NOTE — ED Notes (Signed)
Bed: WA09 Expected date:  Expected time:  Means of arrival:  Comments: EMS 

## 2015-08-26 NOTE — ED Notes (Signed)
Unable to assess screen questions due to pt mental status.

## 2015-08-26 NOTE — ED Triage Notes (Signed)
Pt BIB EMS from [facility] c/o open, hemorrhaging pressure sore on his back. Pt is demented at baseline. Not oriented. Not ambulatory at baseline.

## 2015-08-26 NOTE — Progress Notes (Signed)
[facility] - [place] RN Visit This is a GIP, covered and related hospital admission of 08/26/15 per Dr. Clifton James Monguilod with an [facility] dx of cerebral atherosclerosis.    Patient was transferred to [facility] ED via EMS due to open wounds which were bleeding. Patient was admitted with sepsis. He is a resident at [facility].    Visited patient in his room.  He is very weak, lethargic.  Eyes are open but he did not respond verbally. No family present.   He has a peripheral IV.  He did have a foley catheter placed.   Per chart review the patient is not eating.  He is heard yelling out frequently.    The patient has a total of six decubitus ulcers; stage IV to the rt hip, Stage IV to lt hip, St 4 to sacral area, as well as ulcers to rt medial thigh and left foot.  The left hip wound is infected.  The patient was treated with IV antibiotics - Zosyn and vancomycin  -   The patient has been seen by Palliative Medicine and has orders for Morphine and haldol scheduled as well as PRN.      TC to patient's [employer].  Updated on patient's condition.  She is aware that [facility] has been recommended but she is uncertain as to what her wishes are.  [facility] SW was contacted and will f/u with the HCP[facility] later today.    [facility] will continue to follow and anticipate any discharge needs.   Thank you!   Mickie Kay, [facility] Liaison  [phone]

## 2015-08-26 NOTE — Consult Note (Signed)
Consultation Note Date: 08/26/2015   Patient Name: [name]  DOB: [birth year]  MRN: [id number]  Age / Sex: 75 y.o., male  PCP: Colon Branch, MD Referring Physician: Thurnell Lose, MD  Reason for Consultation: Establishing goals of care  HPI/Patient Profile: 75 y.o. male  with past medical history of HTN, CVA w left hemiparesis, s/p CABG, DVT, paroxysmal atrial fibrillation, multiple decubitus ulcers residing at [facility] admitted on 08/26/2015 with infected decubitus ulcers, anemia d/t blood loss r/t ulcers, encephalopathy and septicemia. Patient was made comfort care upon admission by attending physician.   Clinical Assessment and Goals of Care: Patient is nonverbal at baseline since stroke. Today he is in bed making loud moaning noises, agitated and in pain. He has several decubitus ulcers on bilateral hips, ankle, and sacrum. Goals of care have been previously established as comfort measures only.  I spoke with patient's HCPOA Audrie Lia and she agrees with comfort care only. Patient is a current patient of [facility].   HCPOA- [name] Benegas    SUMMARY OF RECOMMENDATIONS -Continue DNR -Continue Comfort measures -Changed morphine to 2mg  q2 hours, 1mg  q1hr prn for pain, shortness of breath -Scheduled haldol for agitation -Consult placed for transfer to residential Hospice    Code Status/Advance Care Planning:  DNR    Symptom Management:   See summary above  Palliative Prophylaxis:   Aspiration, Delirium Protocol, Frequent Pain Assessment, Oral Care, Palliative Wound Care and Turn Reposition  Additional Recommendations (Limitations, Scope, Preferences):  Avoid Hospitalization, Full Comfort Care, Minimize Medications, Initiate Comfort Feeding, No Artificial Feeding, No Blood Transfusions, No Chemotherapy, No Diagnostics, No Glucose Monitoring, No Hemodialysis, No IV Antibiotics, No IV Fluids,  No Lab Draws, No Radiation, No Surgical Procedures and No Tracheostomy  Psycho-social/Spiritual:   Desire for further Chaplaincy support:No  Additional Recommendations: none  Prognosis:   < 2 weeks d/t patient with septicemia, encephalopathy, anemia, poor po intake, bedbound PPS:20%.  Discharge Planning: Hospice facility pending evaluation and approval      Primary Diagnoses: Present on Admission: . Decubitus ulcer, buttock . Sepsis ([place]) . Coronary artery disease involving native coronary artery of native heart without angina pectoris . 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 The following aspects are pertinent.  Past Medical History:  Diagnosis Date  . Anal fissure   . Anal warts   . Anxiety and depression 02/14/2009   Qualifier: Diagnosis of  By: Larose Kells MD, [place] BPH (benign prostatic hyperplasia)    s/p surgery  . Carotid arterial disease ([place])    s/p RCEA 11/2008 (Note: hx of negative cardiac nuc 03/2009)  . Colonic polyp    cscopes 08/18/05 tics,polyps.Marland Kitchenall hyperplastic  . Depression   . GERD (gastroesophageal reflux disease)    [facility] Esop. (per bx 08/2005) and HH f/u Dr.Schooler  . H/O ETOH abuse    Former. Quit ~2012  . Hemorrhoids   .  Hypertension   . Osteoarthritis   . Panic attack   . Skin cancer    squamous cell, L side of Face; s/p procedule 01/30/08   Social History   Social History  . Marital status: Married    Spouse name: N/A  . Number of children: 2  . Years of education: N/A   Occupational History  . retired  Retired   Social History Main Topics  . Smoking status: Current Some Day Smoker    Packs/day: 1.00    Years: 52.00    Types: Cigarettes  . Smokeless tobacco: Never Used     Comment: < 1 ppd   . Alcohol use No     Comment: currently not drinking, former abuse   . Drug use: No  . Sexual activity: No    Other Topics Concern  . None   Social History Narrative   Lost mom 12-2013   lives by himself, 2 children ([place], [place]), has a girlfriend [name], comes with him at every visit, she visits the pt  daily         Family History  Problem Relation Age of Onset  . Lung cancer Father     mets to brain- deceased  . Arthritis Mother   . Hypertension Mother   . Diabetes Paternal Aunt   . Stroke Paternal Grandfather   . Coronary artery disease Neg Hx   . Colon cancer Neg Hx   . Prostate cancer Neg Hx    Scheduled Meds: . haloperidol lactate  1 mg Intravenous TID   Or  . haloperidol  0.5 mg Oral TID   Or  . haloperidol  0.5 mg Sublingual TID  .  morphine injection  2 mg Intravenous Q4H  . pantoprazole  40 mg Oral Daily  . sodium chloride flush  3 mL Intravenous Q12H   Continuous Infusions:  PRN Meds:.acetaminophen **OR** acetaminophen, albuterol, antiseptic oral rinse, bisacodyl, glycopyrrolate **OR** glycopyrrolate **OR** glycopyrrolate, LORazepam **OR** LORazepam **OR** LORazepam, morphine injection, morphine CONCENTRATE, nitroGLYCERIN, ondansetron **OR** ondansetron (ZOFRAN) IV, polyvinyl alcohol, senna, sodium chloride flush Medications Prior to Admission:  Prior to Admission medications   Medication Sig Start Date End Date Taking? Authorizing Provider  acetaminophen (TYLENOL) 325 MG tablet Take 650 mg by mouth every 4 (four) hours as needed (for fever 100F or above.).   Yes Historical Provider, MD  Amino Acids-Protein Hydrolys (FEEDING SUPPLEMENT, PRO-STAT SUGAR FREE 64,) LIQD Take 30 mLs by mouth 3 (three) times daily. Patient taking differently: Take 30 mLs by mouth 2 (two) times daily.  04/22/15  Yes Debbe Odea, MD  apixaban (ELIQUIS) 5 MG TABS tablet Take 1 tablet (5 mg total) by mouth 2 (two) times daily. 04/30/15  Yes Debbe Odea, MD  citalopram (CELEXA) 20 MG tablet Take 20 mg by mouth daily.   Yes Historical Provider, MD  collagenase (SANTYL) ointment Apply 1  application topically daily. Applied to left ankle topically daily for wound care, cover with foam dressing   Yes Historical Provider, MD  dextrose 5 % solution Inject 75 mLs into the vein as directed. 65ml/hr intravenously every shift for supplement   Yes Historical Provider, MD  divalproex (DEPAKOTE) 250 MG DR tablet Take 250 mg by mouth 2 (two) times daily.   Yes Historical Provider, MD  LORazepam (ATIVAN) 0.5 MG tablet Take 1 tablet (0.5 mg total) by mouth 2 (two) times daily as needed for anxiety. 04/23/15  Yes Debbe Odea, MD  LORazepam (ATIVAN) 2 MG/ML concentrated solution Take 1 mg  by mouth every 4 (four) hours as needed (aggitation/anxiety).   Yes Historical Provider, MD  metoprolol (LOPRESSOR) 50 MG tablet Take 1 tablet (50 mg total) by mouth 2 (two) times daily. 06/28/14  Yes Minus Breeding, MD  morphine (ROXANOL) 20 MG/ML concentrated solution Take 5 mg by mouth every 6 (six) hours as needed for severe pain.   Yes Historical Provider, MD  nitroGLYCERIN (NITROSTAT) 0.4 MG SL tablet Place 1 tablet (0.4 mg total) under the tongue every 5 (five) minutes as needed for chest pain. 12/31/11  Yes Minus Breeding, MD  NUTRITIONAL SUPPLEMENT LIQD Take 120 mLs by mouth 3 (three) times daily. Med Pass 2.0 (to promote weight gain)   Yes Historical Provider, MD  oxyCODONE-acetaminophen (PERCOCET/ROXICET) 5-325 MG tablet Take 1 tablet by mouth every 12 (twelve) hours.   Yes Historical Provider, MD  potassium chloride SA (KLOR-CON M20) 20 MEQ tablet Take 40 mEq by mouth daily.   Yes Historical Provider, MD  sodium hypochlorite (DAKIN'S 1/4 STRENGTH) 0.125 % SOLN Irrigate with 1 application as directed as directed. Applied to left and right hip, and right thigh daily for wound care, cover with dry dressing   Yes Historical Provider, MD  traMADol (ULTRAM) 50 MG tablet Take 1 tablet (50 mg total) by mouth every 6 (six) hours as needed for moderate pain or severe pain. 04/23/15  Yes Debbe Odea, MD   amoxicillin-clavulanate (AUGMENTIN) 875-125 MG tablet Take 1 tablet by mouth every 12 (twelve) hours. Patient not taking: Reported on 08/26/2015 04/22/15   Debbe Odea, MD  apixaban (ELIQUIS) 5 MG TABS tablet Take 2 tablets (10 mg total) by mouth 2 (two) times daily. Patient not taking: Reported on 08/26/2015 04/23/15 08/25/16  Debbe Odea, MD  omeprazole (PRILOSEC) 40 MG capsule Take 1 capsule (40 mg total) by mouth daily. Patient not taking: Reported on 08/26/2015 08/03/14   Colon Branch, MD   Allergies  Allergen Reactions  . Sertraline Hcl     REACTION: heart palpitation    Review of Systems  Unable to perform ROS: Patient nonverbal    Physical Exam  Constitutional: He appears distressed.  catchexic  Cardiovascular:  Tachycardic   Musculoskeletal:  L hemiparesis  Neurological:  Disoriented, does not follow commands  Skin:  Multiple bleeding decubitus ulcers- sacrum, bilateral hips, ankle   Psychiatric:  Agitated    Vital Signs: BP (!) 86/46 (BP Location: Right Wrist)   Pulse 84   Temp 99.7 F (37.6 C) (Axillary)   Resp 24   SpO2 98%  Pain Assessment: PAINAD   Pain Score: Asleep   SpO2: SpO2: 98 % O2 Device:SpO2: 98 % O2 Flow Rate: .   IO: Intake/output summary:  Intake/Output Summary (Last 24 hours) at 08/26/15 1348 Last data filed at 08/26/15 1322  Gross per 24 hour  Intake               10 ml  Output              100 ml  Net              -90 ml    LBM: Last BM Date: 08/26/15 Baseline Weight:   Most recent weight:       Palliative Assessment/Data:   Flowsheet Rows   Flowsheet Row Most Recent Value  Intake Tab  Referral Department  Hospitalist  Unit at Time of Referral  Med/Surg Unit  Palliative Care Primary Diagnosis  Sepsis/Infectious Disease  Date Notified  08/26/15  Palliative Care Type  New Palliative care  Reason for referral  Clarify Goals of Care, End of Life Care Assistance, Counsel Regarding Hospice  Date of Admission  08/26/15  # of  days IP prior to Palliative referral  0  Clinical Assessment  Psychosocial & Spiritual Assessment  Palliative Care Outcomes      Time In: 1330 Time Out: 1415 Time Total: 45 minutes Greater than 50%  of this time was spent counseling and coordinating care related to the above assessment and plan.  Signed by: Mariana Kaufman, AGNP-C Palliative Medicine    Please contact Palliative Medicine Team phone at [phone] for questions and concerns.  For individual provider: See Shea Evans

## 2015-08-26 NOTE — Progress Notes (Signed)
Nutrition Brief Note  Chart reviewed. Pt now transitioning to comfort care.  No further nutrition interventions warranted at this time.  Please re-consult as needed.   Addisen Chappelle M. Vita Currin, MS, RD LDN Inpatient Clinical Dietitian Pager [phone]    

## 2015-08-26 NOTE — ED Provider Notes (Signed)
[facility] DEPT Provider Note   CSN: [id number] Arrival date & time: 08/26/15  [id number]  First Provider Contact:  None       History   Chief Complaint Chief Complaint  Patient presents with  . Open Wound    HPI [name] is a 75 y.o. male.  Patient is a 75 year old male with past medical history of prior CVA, coronary artery disease, diabetes. He was sent from a local nursing home for evaluation of a bleeding bedsore. He has an extensive decubitus ulcer to the right buttock that began bleeding this evening. He was sent here for evaluation of this. The patient adds no additional history secondary to his baseline mental status and prior CVA.   The history is provided by the EMS personnel and the nursing home.    Past Medical History:  Diagnosis Date  . Anal fissure   . Anal warts   . Anxiety and depression 02/14/2009   Qualifier: Diagnosis of  By: Larose Kells MD, [place] BPH (benign prostatic hyperplasia)    s/p surgery  . Carotid arterial disease ([place])    s/p RCEA 11/2008 (Note: hx of negative cardiac nuc 03/2009)  . Colonic polyp    cscopes 08/18/05 tics,polyps.Marland Kitchenall hyperplastic  . Depression   . GERD (gastroesophageal reflux disease)    [facility] Esop. (per bx 08/2005) and HH f/u Dr.Schooler  . H/O ETOH abuse    Former. Quit ~2012  . Hemorrhoids   . Hypertension   . Osteoarthritis   . Panic attack   . Skin cancer    squamous cell, L side of Face; s/p procedule 01/30/08    Patient Active Problem List   Diagnosis Date Noted  . Right leg DVT ([place]) 04/20/2015  . Pressure ulcer 04/19/2015  . Sepsis ([place]) 04/18/2015  . Sacral Wound infection 04/18/2015  . Leg edema, right   . Sacral wound   . Paroxysmal atrial fibrillation ([place]) 03/18/2015  . 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 Coronary artery disease involving native coronary artery of native heart without angina pectoris   . Tachypnea   . Adjustment disorder with mixed anxiety and  depressed mood   . BPH (benign prostatic hyperplasia)   . Hemiparesis affecting dominant side as late effect of stroke ([place])   . Hemiparesis affecting left side as late effect of stroke ([place])   . Hemiparesis and alteration of sensations as late effects of stroke ([place])   . Stroke with cerebral ischemia ([place]) 12/29/2014  . Follow-up ----------- PCP NOTES 09/18/2014  . Hiccups 02/20/2013  . Hyperglycemia 08/19/2012  . BCC (basal cell carcinoma of skin) 10/16/2011  . CAD (coronary artery disease) 05/13/2011  . Hyperlipidemia 05/26/2010  . Annual physical exam 05/26/2010  . GAIT DISTURBANCE 11/25/2009  . Anxiety and depression 02/14/2009  . FATIGUE 02/14/2009  . ALCOHOL ABUSE 12/03/2008  . CAROTID ARTERY DISEASE 12/03/2008  . Osteoarthritis 02/21/2008  . ERECTILE DYSFUNCTION 05/18/2007  . BARRETTS ESOPHAGUS 02/21/2007  . BENIGN PROSTATIC HYPERTROPHY, HX OF 02/21/2007  . Essential hypertension 04/19/2006  . GERD 04/19/2006  . COLONIC POLYPS, BENIGN, HX OF 04/19/2006    Past Surgical History:  Procedure Laterality Date  . APPENDECTOMY    . CATARACT EXTRACTION     wears contact lens L, good vision  . CORONARY ARTERY BYPASS GRAFT  03/23/2011   Procedure: CORONARY ARTERY BYPASS GRAFTING (CABG);  Surgeon: Melrose Nakayama, MD;  Location: [facility];  Service: Open Heart Surgery;  Laterality: N/A;  CABG x four using bilateral greater saphenous vein, harvested endoscopically  . EP IMPLANTABLE DEVICE N/A 01/01/2015   Procedure: Loop Recorder Insertion;  Surgeon: Fuhrmann Grayer, MD;  Location: [facility] CV LAB;  Service: Cardiovascular;  Laterality: N/A;  . FOOT SURGERY     fx  . KNEE SURGERY    . LEFT HEART CATHETERIZATION WITH CORONARY ANGIOGRAM N/A 03/19/2011   Procedure: LEFT HEART CATHETERIZATION WITH CORONARY ANGIOGRAM;  Surgeon: Thayer Headings, MD;  Location: [facility] CATH LAB;  Service: Cardiovascular;  Laterality: N/A;  . Right Carotid Endartectomy with Dacron Patch angioplasty  11/2008  .  skin tags excised    . TEE WITHOUT CARDIOVERSION N/A 01/01/2015   Procedure: TRANSESOPHAGEAL ECHOCARDIOGRAM (TEE) WITH A LOOP;  Surgeon: Pixie Casino, MD;  Location: [facility];  Service: Cardiovascular;  Laterality: N/A;  . TOTAL HIP ARTHROPLASTY  2005   R due to OA  . TRANSURETHRAL RESECTION OF PROSTATE         Home Medications    Prior to Admission medications   Medication Sig Start Date End Date Taking? Authorizing Provider  Amino Acids-Protein Hydrolys (FEEDING SUPPLEMENT, PRO-STAT SUGAR FREE 64,) LIQD Take 30 mLs by mouth 3 (three) times daily. 04/22/15   Debbe Odea, MD  amoxicillin-clavulanate (AUGMENTIN) 875-125 MG tablet Take 1 tablet by mouth every 12 (twelve) hours. 04/22/15   Debbe Odea, MD  apixaban (ELIQUIS) 5 MG TABS tablet Take 2 tablets (10 mg total) by mouth 2 (two) times daily. 04/23/15 04/29/15  Debbe Odea, MD  apixaban (ELIQUIS) 5 MG TABS tablet Take 1 tablet (5 mg total) by mouth 2 (two) times daily. 04/30/15   Debbe Odea, MD  atorvastatin (LIPITOR) 10 MG tablet Take 10 mg by mouth daily.    Historical Provider, MD  Cholecalciferol (VITAMIN D) 2000 units tablet Take 2,000 Units by mouth daily.    Historical Provider, MD  divalproex (DEPAKOTE SPRINKLE) 125 MG capsule Take 125 mg by mouth 2 (two) times daily.     Historical Provider, MD  LORazepam (ATIVAN) 0.5 MG tablet Take 1 tablet (0.5 mg total) by mouth 2 (two) times daily as needed for anxiety. 04/23/15   Debbe Odea, MD  metoprolol (LOPRESSOR) 50 MG tablet Take 1 tablet (50 mg total) by mouth 2 (two) times daily. 06/28/14   Minus Breeding, MD  nitroGLYCERIN (NITROSTAT) 0.4 MG SL tablet Place 1 tablet (0.4 mg total) under the tongue every 5 (five) minutes as needed for chest pain. 12/31/11   Minus Breeding, MD  omeprazole (PRILOSEC) 40 MG capsule Take 1 capsule (40 mg total) by mouth daily. 08/03/14   Colon Branch, MD  traMADol (ULTRAM) 50 MG tablet Take 1 tablet (50 mg total) by mouth every 6 (six) hours as  needed for moderate pain or severe pain. 04/23/15   Debbe Odea, MD  vitamin E 400 UNIT capsule Take 400 Units by mouth daily.    Historical Provider, MD    Family History Family History  Problem Relation Age of Onset  . Lung cancer Father     mets to brain- deceased  . Arthritis Mother   . Hypertension Mother   . Diabetes Paternal Aunt   . Stroke Paternal Grandfather   . Coronary artery disease Neg Hx   . Colon cancer Neg Hx   . Prostate cancer Neg Hx     Social History Social History  Substance Use Topics  . Smoking status: Current Some Day Smoker    Packs/day: 1.00  Years: 52.00    Types: Cigarettes  . Smokeless tobacco: Never Used     Comment: < 1 ppd   . Alcohol use No     Comment: currently not drinking, former abuse      Allergies   Sertraline hcl   Review of Systems Review of Systems  Unable to perform ROS: Patient nonverbal     Physical Exam Updated Vital Signs There were no vitals taken for this visit.  Physical Exam  Constitutional:  Patient is a chronically ill-appearing 75 year old male in no acute distress. He is moaning and making incomprehensible sounds.  HENT:  Head: Normocephalic and atraumatic.  Mouth/Throat: Oropharynx is clear and moist.  Neck: Normal range of motion. Neck supple.  Cardiovascular: Normal rate and regular rhythm.  Exam reveals no friction rub.   No murmur heard. Pulmonary/Chest: Effort normal and breath sounds normal. No respiratory distress. He has no wheezes. He has no rales.  Abdominal: Soft. Bowel sounds are normal. He exhibits no distension. There is no tenderness.  Musculoskeletal: Normal range of motion. He exhibits no edema.  There is an extensive decubitus ulcer to the right buttock. There is clotted blood with gauze in place, however no significant erythema or purulent drainage.  Neurological: He is alert.  Neurologic exam is impossible secondary to patient's baseline mental status and medical diagnoses.    Skin: Skin is warm and dry.  Nursing note and vitals reviewed.    ED Treatments / Results  Labs (all labs ordered are listed, but only abnormal results are displayed) Labs Reviewed  BASIC METABOLIC PANEL  CBC WITH DIFFERENTIAL/PLATELET    EKG  EKG Interpretation None       Radiology No results found.  Procedures Procedures (including critical care time)  Medications Ordered in ED Medications - No data to display   Initial Impression / Assessment and Plan / ED Course  I have reviewed the triage vital signs and the nursing notes.  Pertinent labs & imaging results that were available during my care of the patient were reviewed by me and considered in my medical decision making (see chart for details).  Clinical Course   Final Clinical Impressions(s) / ED Diagnoses   Patient presents here after being sent by nursing home staff for evaluation of bleeding from a decubitus ulcer that is present on his right buttock. He currently on anticoagulation for a DVT. Bleeding had resolved prior to arriving here.  Workup today reveals a leukocytosis of 24,000, hemoglobin of 6.6, and evidence for acute renal failure. He is afebrile, however is somewhat hypotensive. He will be given IV fluids, broad-spectrum antibiotics for presumed infection. Cultures of the blood and urine are pending. Chest x-ray does not reveal an obvious infiltrate. Urinalysis and urine culture are soon to be pending.  I've discussed this case with Dr. Blaine Hamper from the hospitalist service who agrees to admit.  CRITICAL CARE Performed by: Veryl Speak Total critical care time: 45 minutes Critical care time was exclusive of separately billable procedures and treating other patients. Critical care was necessary to treat or prevent imminent or life-threatening deterioration. Critical care was time spent personally by me on the following activities: development of treatment plan with patient and/or surrogate as well as  nursing, discussions with consultants, evaluation of patient's response to treatment, examination of patient, obtaining history from patient or surrogate, ordering and performing treatments and interventions, ordering and review of laboratory studies, ordering and review of radiographic studies, pulse oximetry and re-evaluation of patient's condition.  Final diagnoses:  None    New Prescriptions New Prescriptions   No medications on file     Veryl Speak, MD 08/26/15 [phone]

## 2015-08-26 NOTE — Progress Notes (Addendum)
This is a no charge note  Pending admission per Dr. Stark Jock  75 year old man, nursing home resident, with past medical history of dementia, hypertension, GERD, depression, anxiety, dementia, CAD, s/p of CABG, stroke, PAF, DVT on Eliquis, skin cancer, BPH, tobacco abuse, who presents with hemorrhaging open pressure sore on his back. Pt is demented at baseline. Not oriented. Not ambulatory at baseline  Pt was found to have hemoglobin 6.6, WBC 24.0, potassium 5.5, AKI with Cre 1.86, blood pressure 90/52, blood sugar 59--> IV vancomycin and Zosyn in started by ED. Will order 2 U blood transfusion. int/ptt, sepsis protocol, IVF, CBG q1h and prn D50, stat EKG for hyperkalemia, FOBT. Pt is accepted to [facility] as inpt.  Ivor Costa, MD  [facility] Pager [phone]  If 7PM-7AM, please contact night-coverage [email or web address] Password [facility] 08/26/2015, 6:35 AM

## 2015-08-26 NOTE — ED Notes (Signed)
HBG 6.6, Dr. Stark Jock and Ethelene Browns RN notified

## 2015-08-26 NOTE — ED Notes (Signed)
Lactic Acid 3.1, [name] notified

## 2015-08-26 NOTE — ED Notes (Signed)
Called Dr. Candiss Norse and confirmed that pt is only to have saline lock and his cardiac monitoring can be discharged.

## 2015-08-27 DIAGNOSIS — N179 Acute kidney failure, unspecified: Secondary | ICD-10-CM

## 2015-08-27 DIAGNOSIS — L89309 Pressure ulcer of unspecified buttock, unspecified stage: Secondary | ICD-10-CM

## 2015-08-27 DIAGNOSIS — T148 Other injury of unspecified body region: Secondary | ICD-10-CM

## 2015-08-27 DIAGNOSIS — Z515 Encounter for palliative care: Secondary | ICD-10-CM

## 2015-08-27 DIAGNOSIS — R7881 Bacteremia: Secondary | ICD-10-CM

## 2015-08-27 DIAGNOSIS — A419 Sepsis, unspecified organism: Secondary | ICD-10-CM

## 2015-08-27 DIAGNOSIS — L8990 Pressure ulcer of unspecified site, unspecified stage: Secondary | ICD-10-CM

## 2015-08-27 DIAGNOSIS — L089 Local infection of the skin and subcutaneous tissue, unspecified: Secondary | ICD-10-CM

## 2015-08-27 NOTE — Discharge Summary (Signed)
Physician Discharge Summary  [name] [id number] DOB: 1940-07-18 DOA: 08/26/2015  PCP: Kathlene November, MD  Admit date: 08/26/2015 Discharge date: 08/27/2015  Recommendations for Outpatient Follow-up:  1. Transfer to residential hospice   Discharge Diagnoses:  1. Sepsis secondary to polymicrobial left decubitus ulcer wound infection (Klebsiella pneumoniae, Proteus, Enterobacteriaceae).  2. Acute blood loss anemia secondary to bleeding decubitus ulcer 3. Acute kidney injury 4. Pressure ulcers present on admission 5. Advanced dementia 6. PMH DVT    Discharge Condition: terminal Disposition: residential hospice  Diet recommendation: as desired  History of present illness:  75 year old man with dementia, bedbound secondary to stroke causing left-sided hemiparesis, multiple sacral decubitus ulcers, DVT on anticoagulation, at SNF in hospice, sent to the emergency department for bleeding from a decubitus ulcer. Admitted for sepsis secondary to left buttock wound infection. After discussion between admitting MD and HCPOA, plan was made for comfort care and palliative medicine involvement.  Hospital Course:  Patient was treated with supportive care and seen by the palliative medicine team. Healthcare power of attorney agreed with comfort care and transfer to residential hospice. Patient was accepted to [facility] and will be transferred today. Hospitalization was uncomplicated.  1. Sepsis secondary to polymicrobial left decubitus ulcer wound infection (Klebsiella pneumoniae, Proteus, Enterobacteriaceae). After discussion with admitting physician and healthcare power of attorney on admission, patient was made comfort care. 2. ABLA secondary to bleeding decubitus ulcer complicated by anticoagulation. Anticoagulation discontinued. No treatment indicated. 3. AKI. Comfort care. 4. Hypotension.  5. 6 pressure ulcers present on admission. Stage IV and right lateral hip. Stage III-4 left  hip. Stage IV and sacrum. Comfort care. 6. PMH DVT on anticoagulation, CAD, PAF CHA2DS2-VASc = 5. Anticoagulation no longer indicated. 7. Advanced dementia.   Consultants:  PMT  Discharge Instructions     Medication List    STOP taking these medications   acetaminophen 325 MG tablet Commonly known as:  TYLENOL   amoxicillin-clavulanate 875-125 MG tablet Commonly known as:  AUGMENTIN   apixaban 5 MG Tabs tablet Commonly known as:  ELIQUIS   citalopram 20 MG tablet Commonly known as:  CELEXA   dextrose 5 % solution   feeding supplement (PRO-STAT SUGAR FREE 64) Liqd   KLOR-CON M20 20 MEQ tablet Generic drug:  potassium chloride SA   metoprolol 50 MG tablet Commonly known as:  LOPRESSOR   nitroGLYCERIN 0.4 MG SL tablet Commonly known as:  NITROSTAT   NUTRITIONAL SUPPLEMENT Liqd   omeprazole 40 MG capsule Commonly known as:  PRILOSEC   oxyCODONE-acetaminophen 5-325 MG tablet Commonly known as:  PERCOCET/ROXICET   sodium hypochlorite 0.125 % Soln Commonly known as:  DAKIN'S 1/4 STRENGTH   traMADol 50 MG tablet Commonly known as:  ULTRAM     TAKE these medications   collagenase ointment Commonly known as:  SANTYL Apply 1 application topically daily. Applied to left ankle topically daily for wound care, cover with foam dressing   divalproex 250 MG DR tablet Commonly known as:  DEPAKOTE Take 250 mg by mouth 2 (two) times daily.   LORazepam 2 MG/ML concentrated solution Commonly known as:  ATIVAN Take 1 mg by mouth every 4 (four) hours as needed (aggitation/anxiety). What changed:  Another medication with the same name was removed. Continue taking this medication, and follow the directions you see here.   morphine 20 MG/ML concentrated solution Commonly known as:  ROXANOL Take 5 mg by mouth every 6 (six) hours as needed for severe pain.  Allergies  Allergen Reactions  . Sertraline Hcl     REACTION: heart palpitation     The results of  significant diagnostics from this hospitalization (including imaging, microbiology, ancillary and laboratory) are listed below for reference.    Significant Diagnostic Studies: Dg Chest Port 1 View  Result Date: 08/26/2015 CLINICAL DATA:  Initial evaluation for leukocytosis. History dementia. EXAM: PORTABLE CHEST 1 VIEW COMPARISON:  Prior radiograph from 05/19/2013. FINDINGS: Median sternotomy wires underlying surgical clips and CABG markers present. Right-sided PICC catheter in place with tip overlying the proximal SVC. Heart size is stable. Mediastinal silhouette within normal limits. Lungs are normally inflated. No focal infiltrate, pulmonary edema, or pleural effusion. No pneumothorax. No acute osseous abnormality. IMPRESSION: 1. No active cardiopulmonary disease. 2. Sequela of prior CABG. Electronically Signed   By: Jeannine Boga M.D.   On: 08/26/2015 05:58    Microbiology: Recent Results (from the past 240 hour(s))  Blood culture (routine x 2)     Status: None (Preliminary result)   Collection Time: 08/26/15  6:00 AM  Result Value Ref Range Status   Specimen Description BLOOD LEFT WRIST  Final   Special Requests IN PEDIATRIC BOTTLE 3.5ML  Final   Culture  Setup Time   Final    GRAM NEGATIVE RODS IN PEDIATRIC BOTTLE Organism ID to follow CRITICAL RESULT CALLED TO, READ BACK BY AND VERIFIED WITH: L POINDEXTER PHARMD 2324 08/26/15 A BROWNING    Culture   Final    GRAM NEGATIVE RODS CULTURE REINCUBATED FOR BETTER GROWTH Performed at [facility]    Report Status PENDING  Incomplete  Blood Culture ID Panel (Reflexed)     Status: Abnormal   Collection Time: 08/26/15  6:00 AM  Result Value Ref Range Status   Enterococcus species NOT DETECTED NOT DETECTED Final   Vancomycin resistance NOT DETECTED NOT DETECTED Final   Listeria monocytogenes NOT DETECTED NOT DETECTED Final   Staphylococcus species NOT DETECTED NOT DETECTED Final   Staphylococcus aureus NOT DETECTED NOT  DETECTED Final   Methicillin resistance NOT DETECTED NOT DETECTED Final   Streptococcus species NOT DETECTED NOT DETECTED Final   Streptococcus agalactiae NOT DETECTED NOT DETECTED Final   Streptococcus pneumoniae NOT DETECTED NOT DETECTED Final   Streptococcus pyogenes NOT DETECTED NOT DETECTED Final   Acinetobacter baumannii NOT DETECTED NOT DETECTED Final   Enterobacteriaceae species DETECTED (A) NOT DETECTED Final    Comment: CRITICAL RESULT CALLED TO, READ BACK BY AND VERIFIED WITH: L POINDEXTER PHARMD 2324 08/26/15 A BROWNING    Enterobacter cloacae complex NOT DETECTED NOT DETECTED Final   Escherichia coli NOT DETECTED NOT DETECTED Final   Klebsiella oxytoca NOT DETECTED NOT DETECTED Final   Klebsiella pneumoniae DETECTED (A) NOT DETECTED Final    Comment: CRITICAL RESULT CALLED TO, READ BACK BY AND VERIFIED WITH: L POINDEXTER PHARMD 2324 08/26/15 A BROWNING    Proteus species DETECTED (A) NOT DETECTED Final    Comment: CRITICAL RESULT CALLED TO, READ BACK BY AND VERIFIED WITH: L POINDEXTER PHARMD 2324 08/26/15 A BROWNING    Serratia marcescens NOT DETECTED NOT DETECTED Final   Carbapenem resistance NOT DETECTED NOT DETECTED Final   Haemophilus influenzae NOT DETECTED NOT DETECTED Final   Neisseria meningitidis NOT DETECTED NOT DETECTED Final   Pseudomonas aeruginosa NOT DETECTED NOT DETECTED Final   Candida albicans NOT DETECTED NOT DETECTED Final   Candida glabrata NOT DETECTED NOT DETECTED Final   Candida krusei NOT DETECTED NOT DETECTED Final   Candida parapsilosis NOT  DETECTED NOT DETECTED Final   Candida tropicalis NOT DETECTED NOT DETECTED Final    Comment: Performed at [facility]  MRSA PCR Screening     Status: None   Collection Time: 08/26/15  9:38 AM  Result Value Ref Range Status   MRSA by PCR NEGATIVE NEGATIVE Final    Comment:        The GeneXpert MRSA Assay (FDA approved for NASAL specimens only), is one component of a comprehensive MRSA  colonization surveillance program. It is not intended to diagnose MRSA infection nor to guide or monitor treatment for MRSA infections.   Culture, Urine     Status: None (Preliminary result)   Collection Time: 08/26/15  9:43 AM  Result Value Ref Range Status   Specimen Description URINE, CATHETERIZED  Final   Special Requests Immunocompromised  Final   Culture   Final    CULTURE REINCUBATED FOR BETTER GROWTH Performed at [facility]    Report Status PENDING  Incomplete     Labs: Basic Metabolic Panel:  Recent Labs Lab 08/26/15 0502  NA 132*  K 5.5*  CL 102  CO2 23  GLUCOSE 59*  BUN 28*  CREATININE 1.86*  CALCIUM 7.7*   Liver Function Tests:  Recent Labs Lab 08/26/15 0600  ALBUMIN 1.4*   CBC:  Recent Labs Lab 08/26/15 0502  WBC 24.0*  NEUTROABS 20.2*  HGB 6.6*  HCT 21.1*  MCV 95.0  PLT 338    Principal Problem:   Sepsis ([place]) Active Problems:   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

## 2015-08-27 NOTE — Progress Notes (Signed)
Report called to [facility]. Report given to [place], Therapist, sports. No questions or concerns at this time. Waiting on PTAR for transportation.  Tyshawn Keel W Saige [place], RN

## 2015-08-27 NOTE — Progress Notes (Signed)
Facility called and DC summary received.  DNR form complete. PTAR called for transport.

## 2015-08-27 NOTE — Progress Notes (Signed)
Pt discharged from the unit via PTAR. Discharge packet given to the transporter. No questions or concerns at this time.  Rashun Grattan W Isadore Bokhari, RN

## 2015-08-27 NOTE — Clinical Social Work Note (Signed)
Clinical Social Work Assessment  Patient Details  Name: [name] MRN: [id number] Date of Birth: 1940/11/25  Date of referral:  08/27/15               Reason for consult:  End of Life/Hospice                Permission sought to share information with:    Permission granted to share information::     Name::        Agency::     Relationship::   Friend   Contact Information:   Karleen Dolphin [phone]  Housing/Transportation Living arrangements for the past 2 months:  [place] of Information:  Other (Comment Required), Medical Team Patient Interpreter Needed:  None Criminal Activity/Legal Involvement Pertinent to Current Situation/Hospitalization:  No - Comment as needed Significant Relationships:  Friend Lives with:  Facility Resident Do you feel safe going back to the place where you live?  No Need for family participation in patient care:  Yes (Comment)  Care giving concerns: Patient has been followed by [place], and has been a facility resident as [employer]. Patient is hospice at baseline.    Social Worker assessment / plan:Patient nonverbal due to encephalopathy and stroke.  LCSWA will assist patient and Hospice with disposition to [facility]. LCSWA left voicemail with patient friend Karleen Dolphin.   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   Patient/Family's Understanding of and Emotional Response to Diagnosis, Current Treatment, and Prognosis:  Patient friend Karleen Dolphin involved in patient care.   Emotional Assessment Appearance:  Appears stated age Attitude/Demeanor/Rapport:    Affect (typically observed):    Orientation:    Alcohol / Substance use:    Psych involvement (Current and /or in the  community):  No (Comment)  Discharge Needs  Concerns to be addressed:  No discharge needs identified Readmission within the last 30 days:  Yes Current discharge risk:  None Barriers to Discharge:  No Barriers Identified   Lia Hopping, LCSW 08/27/2015, 2:52 PM

## 2015-08-27 NOTE — Progress Notes (Signed)
LCSWA discussed patient disposition with [facility]. Patient has been accepted to [facility]. LCSWA will assist with transport to facility. LCSWA informed admission coordinator, [facility],  patient will not be returning. Facility agreed to pack patient belongings and hold them until pt. friend can pick them up.

## 2015-08-27 NOTE — Progress Notes (Signed)
PROGRESS NOTE  [name] [id number] DOB: 21-Jan-1940 DOA: 08/26/2015 PCP: Kathlene November, MD  Brief Narrative: 75 year old man with dementia, bedbound secondary to stroke causing left-sided hemiparesis, multiple sacral decubitus ulcers, DVT on anticoagulation, at SNF in hospice, sent to the emergency department for bleeding from a decubitus ulcer. Admitted for sepsis secondary to left buttock wound infection. After discussion, plan was made for comfort care and palliative medicine involvement.  Assessment/Plan: 1. Sepsis secondary to polymicrobial left decubitus ulcer wound infection (Klebsiella pneumoniae, Proteus, Enterobacteriaceae). After discussion with admitting physician and healthcare power of attorney on admission, patient was made comfort care. 2. ABLA secondary to bleeding decubitus ulcer complicated by anticoagulation. Anticoagulation discontinued. No treatment indicated. 3. AKI. Comfort care. 4. Hypotension.  5. 6 pressure ulcers present on admission. Stage IV and right lateral hip. Stage III-4 left hip. Stage IV and sacrum. Comfort care. 6. PMH DVT on anticoagulation, CAD, PAF CHA2DS2-VASc = 5. Anticoagulation no longer indicated. 7. Advanced dementia.    Plan transfer to residential hospice today  DVT prophylaxis: comfort care Code Status: DNR/DNI Family Communication: message left of [place] Disposition Plan: Residential hospice   Murray Hodgkins, MD  [facility] Direct contact: [phone] --[place]ia [place]  --[email or web address]; password [email or web address]  7PM-7AM contact night coverage as above 08/27/2015, 10:53 AM  LOS: 1 day   Consultants:  PMT  Procedures:    Antimicrobials:    HPI/Subjective: Nonverbal  Objective: Vitals:   08/26/15 1109 08/26/15 1502 08/26/15 2151 08/27/15 0452  BP: (!) 86/46 (!) 77/36 (!) 111/52 (!) 89/59  Pulse: 84 86 90 88  Resp:   16 16  Temp: 99.7 F (37.6 C) 98.7 F (37.1 C) 97.8 F (36.6 C) 98.7 F (37.1  C)  TempSrc: Axillary Axillary Axillary Axillary  SpO2: 98% 100%  100%    Intake/Output Summary (Last 24 hours) at 08/27/15 1053 Last data filed at 08/27/15 0855  Gross per 24 hour  Intake               20 ml  Output              725 ml  Net             -705 ml     There were no vitals filed for this visit.  Exam:    Constitutional:  . Appears Ill, nontoxic, chronically ill Respiratory:  . CTA bilaterally, no w/r/r.  . Respiratory effort normal. No retractions or accessory muscle use Cardiovascular:  . RRR, no m/r/g Psychiatric:  . Mental status o Unremarkable  I have personally reviewed following labs and imaging studies:  Admission labs reviewed. None repeated today.  Scheduled Meds: . haloperidol lactate  1 mg Intravenous TID   Or  . haloperidol  0.5 mg Oral TID   Or  . haloperidol  0.5 mg Sublingual TID  .  morphine injection  2 mg Intravenous Q4H  . pantoprazole  40 mg Oral Daily  . sodium chloride flush  3 mL Intravenous Q12H   Continuous Infusions:   Principal Problem:   Sepsis ([place]) Active Problems:   Stroke due to occlusion of right middle cerebral artery ([place])   Coronary artery disease involving native coronary artery of native heart without angina pectoris   Hemiparesis affecting left side as late effect of stroke (HCC)   Sacral Wound infection   Decubitus ulcer, buttock   Agitation   Pain and tenderness   Goals of care, counseling/discussion   Terminal care  LOS: 1 day          

## 2015-08-27 NOTE — Progress Notes (Signed)
[facility]  1508   Hospice and Palliative Care RN Note for [facility]   There is a bed available at  [facility] for patient to transfer today.   [facility] SW is scheduled to meet with POA Karleen Dolphin at 2 pm at her home to get necessary paperwork signed and she will notify CSW when completed.   Please fax discharge summary when available to  [phone] RN please call report to [facility] at [phone]  CSW please arrange transport for later this afternoon after hearing from [facility]   Please call with any questions.    Mickie Kay, RN  [place]  [phone]

## 2015-08-29 ENCOUNTER — Ambulatory Visit (INDEPENDENT_AMBULATORY_CARE_PROVIDER_SITE_OTHER): Admitting: *Deleted

## 2015-08-29 DIAGNOSIS — I639 Cerebral infarction, unspecified: Secondary | ICD-10-CM | POA: Diagnosis not present

## 2015-08-29 LAB — URINE CULTURE: Culture: >=100000 — AB

## 2015-08-30 LAB — TYPE AND SCREEN
ABO/RH(D): B POS
ANTIBODY SCREEN: NEGATIVE
UNIT DIVISION: 0
UNIT DIVISION: 0

## 2015-08-30 LAB — CULTURE, BLOOD (ROUTINE X 2)

## 2015-08-30 NOTE — Progress Notes (Signed)
Carelink Summary Report / Loop Recorder 

## 2015-09-13 ENCOUNTER — Encounter: Payer: Self-pay | Admitting: Cardiology

## 2015-09-13 NOTE — Progress Notes (Signed)
Letter  

## 2015-09-13 DEATH — deceased

## 2015-09-20 ENCOUNTER — Encounter: Payer: Self-pay | Admitting: Cardiology

## 2015-09-24 LAB — CUP PACEART REMOTE DEVICE CHECK: MDC IDC SESS DTM: 20170818020910

## 2015-09-30 ENCOUNTER — Encounter: Admitting: *Deleted

## 2016-06-10 ENCOUNTER — Encounter (HOSPITAL_COMMUNITY)

## 2016-06-10 ENCOUNTER — Ambulatory Visit: Payer: Medicare PPO | Admitting: Vascular Surgery

## 2016-07-22 ENCOUNTER — Encounter (HOSPITAL_COMMUNITY)

## 2016-07-22 ENCOUNTER — Ambulatory Visit: Admitting: Vascular Surgery

## 2016-12-07 IMAGING — CT CT ANGIO HEAD
1 of 10 series · 1 of 33 positions shown · IV contrast (Iohexol (Omnipaque 350))
Comparison: CT head without contrast from the same day. MRI brain
11/19/2008

CLINICAL DATA: Sudden onset of expressive aphasia. Abnormal CT of
the head. Left-sided weakness and gaze preference to the right. Left
hemiparesis has near completely resolved.



[Series 200: locator · axial · 0.49mm/px · 1 of 1 slices shown]
[im 1/1  soft-tissue]
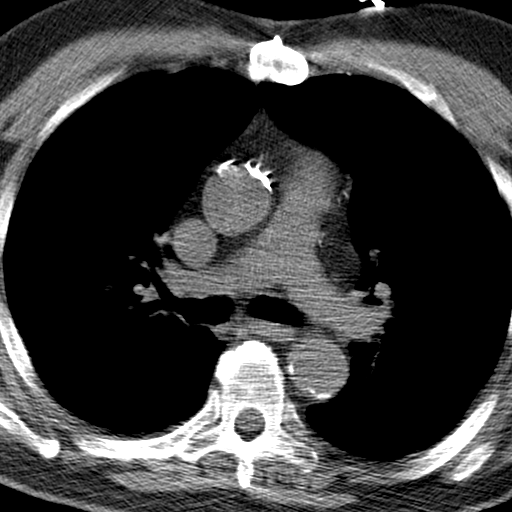

[1 of 33 positions shown; findings below may reference images not displayed]

FINDINGS: CT HEAD

Brain: Age indeterminate infarct is again noted in the anterior left
frontal lobe and operculum. Moderate atrophy and diffuse white
matter disease is present. The ventricles are proportionate to the
degree of atrophy. No significant extra-axial fluid collection
present.

Calvarium and skull base: The calvarium is intact. The skullbase is
unremarkable.

Paranasal sinuses: Mucosal thickening polypoid changes are noted in
the posterior maxillary sinuses bilaterally. Paranasal sinuses are
otherwise clear. There is some fluid in the mastoid air cells
bilaterally. No obstructing nasopharyngeal lesion is evident.

Orbits: Bilateral lens replacements are present. The globes and
orbits are otherwise intact.

CTA NECK

Aortic arch: A 3 vessel arch configuration is present.
Atherosclerotic changes are noted at the origin of the left
subclavian artery without a significant stenosis relative to the
more distal vessel.

Right carotid system: The right common carotid artery is tortuous
without a significant focal stenosis. Atherosclerotic irregularity
is present with an the endarterectomy. There is no focal stenosis
relative to the more distal vessel. The cervical right ICA is
otherwise normal.

Left carotid system: The left common carotid artery demonstrates
atherosclerotic calcifications medially without a significant
stenosis relative to the more distal vessel. The left carotid
bifurcation demonstrates more dense calcifications without a
significant stenosis relative to the more distal vessel. The
cervical left ICA is otherwise normal.

Vertebral arteries:The vertebral arteries both originate from the
subclavian arteries. Proximal calcifications are present within the
right subclavian artery without a significant stenosis. The
vertebral arteries are codominant. There is no significant stenosis
within either vertebral artery in the neck.

Skeleton: Multilevel endplate degenerative changes are present in
the cervical spine. Degenerative anterolisthesis as present C3-4 and
C4-5. Vertebral body heights and alignment are otherwise normal.
Osseous foraminal narrowing is present at multiple levels, most
severe on the right at C5-6. Median sternotomy is noted. There is
mild rightward curvature of the cervical spine.

Other neck: No focal mucosal or submucosal lesions are present.
Thyroid is within limits. There is no significant cervical
adenopathy. The salivary glands are intact.

CTA HEAD

Anterior circulation: Atherosclerotic calcifications are present
within the precavernous and cavernous internal carotid arteries
bilaterally without significant stenosis. The A1 and M1 segments are
normal. The anterior communicating artery is patent. The MCA
bifurcations are intact. The ACA and MCA branch vessels are within
normal limits.

Posterior circulation: The vertebral arteries are codominant. The
PICA origins are visualized and normal. The basilar artery is within
normal limits. Both posterior cerebral arteries originate from the
basilar tip. PCA branch vessels are intact.

Venous sinuses: The dural sinuses are patent. The right transverse
sinus is dominant. The straight sinus and deep cerebral veins are
intact. The cortical veins are unremarkable.

Anatomic variants: None

Delayed phase: The postcontrast images demonstrate no pathologic
enhancement.
IMPRESSION: 1. Age indeterminate infarct involving the anterior left insular
cortex and frontal operculum.
2. Moderate atrophy and white matter disease.
3. No other acute cortical infarct.
4. Atherosclerotic calcifications are present in the left common
carotid artery the left carotid bifurcation bilateral cavernous
internal carotid arteries without significant stenosis.
5. Atherosclerotic calcifications at the origin of the left
subclavian artery and right subclavian artery. There is no
associated stenosis.
6. Multilevel spondylosis of the cervical spine with foraminal
narrowing at multiple levels.
